# Patient Record
Sex: Female | Born: 1970 | Race: White | Hispanic: No | Marital: Single | Smoking: Never smoker
Health system: Southern US, Community
[De-identification: ages and names within clinical notes are randomized; demographics above are authoritative.]

## PROBLEM LIST (undated history)

## (undated) DIAGNOSIS — F259 Schizoaffective disorder, unspecified: Secondary | ICD-10-CM

## (undated) DIAGNOSIS — H9190 Unspecified hearing loss, unspecified ear: Secondary | ICD-10-CM

## (undated) DIAGNOSIS — K219 Gastro-esophageal reflux disease without esophagitis: Secondary | ICD-10-CM

## (undated) DIAGNOSIS — J45909 Unspecified asthma, uncomplicated: Secondary | ICD-10-CM

## (undated) DIAGNOSIS — K2270 Barrett's esophagus without dysplasia: Secondary | ICD-10-CM

## (undated) DIAGNOSIS — F250 Schizoaffective disorder, bipolar type: Secondary | ICD-10-CM

## (undated) HISTORY — PX: CATARACT EXTRACTION: SUR2

## (undated) HISTORY — PX: EYE SURGERY: SHX253

---

## 2002-02-02 ENCOUNTER — Emergency Department (HOSPITAL_COMMUNITY): Admission: EM | Admit: 2002-02-02 | Discharge: 2002-02-03 | Payer: Self-pay | Admitting: Emergency Medicine

## 2002-02-02 ENCOUNTER — Encounter: Payer: Self-pay | Admitting: Emergency Medicine

## 2002-05-21 ENCOUNTER — Encounter: Payer: Self-pay | Admitting: Emergency Medicine

## 2002-05-21 ENCOUNTER — Emergency Department (HOSPITAL_COMMUNITY): Admission: EM | Admit: 2002-05-21 | Discharge: 2002-05-21 | Payer: Self-pay | Admitting: Emergency Medicine

## 2002-07-16 ENCOUNTER — Emergency Department (HOSPITAL_COMMUNITY): Admission: EM | Admit: 2002-07-16 | Discharge: 2002-07-16 | Payer: Self-pay | Admitting: Emergency Medicine

## 2002-07-16 ENCOUNTER — Encounter: Payer: Self-pay | Admitting: Emergency Medicine

## 2007-07-15 ENCOUNTER — Emergency Department (HOSPITAL_COMMUNITY): Admission: EM | Admit: 2007-07-15 | Discharge: 2007-07-15 | Payer: Self-pay | Admitting: Emergency Medicine

## 2007-07-17 ENCOUNTER — Emergency Department (HOSPITAL_COMMUNITY): Admission: EM | Admit: 2007-07-17 | Discharge: 2007-07-17 | Payer: Self-pay | Admitting: Emergency Medicine

## 2007-07-20 ENCOUNTER — Emergency Department (HOSPITAL_COMMUNITY): Admission: EM | Admit: 2007-07-20 | Discharge: 2007-07-20 | Payer: Self-pay | Admitting: Emergency Medicine

## 2011-04-25 LAB — STOOL CULTURE

## 2011-04-25 LAB — CBC
HCT: 38.3
Hemoglobin: 13.3
MCHC: 34.9
MCV: 82.8
Platelets: 417 — ABNORMAL HIGH
Platelets: 457 — ABNORMAL HIGH
RBC: 4.62
RDW: 13.1
WBC: 16.2 — ABNORMAL HIGH
WBC: 16.8 — ABNORMAL HIGH

## 2011-04-25 LAB — URINALYSIS, ROUTINE W REFLEX MICROSCOPIC
Nitrite: NEGATIVE
Protein, ur: 30 — AB
pH: 5.5

## 2011-04-25 LAB — DIFFERENTIAL
Basophils Absolute: 0
Basophils Relative: 0
Eosinophils Absolute: 0
Eosinophils Absolute: 0
Eosinophils Relative: 0
Lymphocytes Relative: 22
Lymphocytes Relative: 9 — ABNORMAL LOW
Lymphs Abs: 1.5
Lymphs Abs: 3.8
Monocytes Absolute: 1.1 — ABNORMAL HIGH
Monocytes Relative: 7
Neutro Abs: 13.6 — ABNORMAL HIGH
Neutrophils Relative %: 71
Neutrophils Relative %: 84 — ABNORMAL HIGH

## 2011-04-25 LAB — BASIC METABOLIC PANEL
BUN: 15
BUN: 16
CO2: 26
Calcium: 8.1 — ABNORMAL LOW
Calcium: 8.8
Chloride: 101
Creatinine, Ser: 0.86
Creatinine, Ser: 0.89
GFR calc Af Amer: >60
GFR calc non Af Amer: >60
GFR calc non Af Amer: >60
Glucose, Bld: 123 — ABNORMAL HIGH
Potassium: 4.2
Sodium: 137

## 2011-04-25 LAB — POCT PREGNANCY, URINE
Operator id: 29026
Preg Test, Ur: NEGATIVE

## 2011-04-25 LAB — CLOSTRIDIUM DIFFICILE EIA

## 2011-04-25 LAB — URINE MICROSCOPIC-ADD ON

## 2011-04-25 LAB — URINE CULTURE

## 2011-04-25 LAB — OVA AND PARASITE EXAMINATION

## 2013-07-12 ENCOUNTER — Inpatient Hospital Stay (HOSPITAL_COMMUNITY)
Admission: EM | Admit: 2013-07-12 | Discharge: 2013-07-16 | DRG: 202 | Disposition: A | Discharge status: Home / Self Care | Payer: Medicaid Other | Attending: Internal Medicine | Admitting: Internal Medicine

## 2013-07-12 ENCOUNTER — Emergency Department (HOSPITAL_COMMUNITY): Payer: Medicaid Other

## 2013-07-12 ENCOUNTER — Encounter (HOSPITAL_COMMUNITY): Payer: Self-pay | Admitting: Emergency Medicine

## 2013-07-12 DIAGNOSIS — H908 Mixed conductive and sensorineural hearing loss, unspecified: Secondary | ICD-10-CM | POA: Diagnosis present

## 2013-07-12 DIAGNOSIS — F259 Schizoaffective disorder, unspecified: Secondary | ICD-10-CM | POA: Diagnosis present

## 2013-07-12 DIAGNOSIS — H913 Deaf nonspeaking, not elsewhere classified: Secondary | ICD-10-CM | POA: Diagnosis present

## 2013-07-12 DIAGNOSIS — R6510 Systemic inflammatory response syndrome (SIRS) of non-infectious origin without acute organ dysfunction: Secondary | ICD-10-CM | POA: Diagnosis present

## 2013-07-12 DIAGNOSIS — E039 Hypothyroidism, unspecified: Secondary | ICD-10-CM | POA: Diagnosis present

## 2013-07-12 DIAGNOSIS — K5900 Constipation, unspecified: Secondary | ICD-10-CM | POA: Diagnosis present

## 2013-07-12 DIAGNOSIS — F411 Generalized anxiety disorder: Secondary | ICD-10-CM | POA: Diagnosis present

## 2013-07-12 DIAGNOSIS — F419 Anxiety disorder, unspecified: Secondary | ICD-10-CM | POA: Diagnosis present

## 2013-07-12 DIAGNOSIS — K219 Gastro-esophageal reflux disease without esophagitis: Secondary | ICD-10-CM | POA: Diagnosis present

## 2013-07-12 DIAGNOSIS — G809 Cerebral palsy, unspecified: Secondary | ICD-10-CM | POA: Diagnosis present

## 2013-07-12 DIAGNOSIS — J45902 Unspecified asthma with status asthmaticus: Principal | ICD-10-CM | POA: Diagnosis present

## 2013-07-12 HISTORY — DX: Schizoaffective disorder, bipolar type: F25.0

## 2013-07-12 HISTORY — DX: Schizoaffective disorder, unspecified: F25.9

## 2013-07-12 HISTORY — DX: Unspecified asthma, uncomplicated: J45.909

## 2013-07-12 LAB — POCT I-STAT 3, ART BLOOD GAS (G3+)
Acid-Base Excess: 4 mmol/L — ABNORMAL HIGH (ref 0.0–2.0)
Bicarbonate: 25.1 mEq/L — ABNORMAL HIGH (ref 20.0–24.0)
Patient temperature: 99
TCO2: 26 mmol/L (ref 0–100)

## 2013-07-12 LAB — BASIC METABOLIC PANEL
CO2: 27 mEq/L (ref 19–32)
Calcium: 8.6 mg/dL (ref 8.4–10.5)
GFR calc Af Amer: 84 mL/min — ABNORMAL LOW (ref >90)
Sodium: 134 mEq/L — ABNORMAL LOW (ref 135–145)

## 2013-07-12 LAB — CBC
MCV: 86.7 fL (ref 78.0–100.0)
Platelets: 318 10*3/uL (ref 150–400)
RBC: 4.2 MIL/uL (ref 3.87–5.11)
WBC: 13.4 10*3/uL — ABNORMAL HIGH (ref 4.0–10.5)

## 2013-07-12 LAB — PRO B NATRIURETIC PEPTIDE: Pro B Natriuretic peptide (BNP): 122.8 pg/mL (ref 0–125)

## 2013-07-12 LAB — CG4 I-STAT (LACTIC ACID): Lactic Acid, Venous: 1.47 mmol/L (ref 0.5–2.2)

## 2013-07-12 MED ORDER — ALBUTEROL SULFATE (5 MG/ML) 0.5% IN NEBU
INHALATION_SOLUTION | RESPIRATORY_TRACT | Status: AC
  Filled 2013-07-12: qty 1

## 2013-07-12 MED ORDER — ALBUTEROL SULFATE (5 MG/ML) 0.5% IN NEBU
10.0000 mg | INHALATION_SOLUTION | Freq: Once | RESPIRATORY_TRACT | Status: AC
  Administered 2013-07-13: 10 mg via RESPIRATORY_TRACT
  Filled 2013-07-12: qty 2

## 2013-07-12 MED ORDER — ALBUTEROL SULFATE (5 MG/ML) 0.5% IN NEBU
5.0000 mg | INHALATION_SOLUTION | Freq: Once | RESPIRATORY_TRACT | Status: AC
  Administered 2013-07-12: 5 mg via RESPIRATORY_TRACT
  Filled 2013-07-12: qty 1

## 2013-07-12 MED ORDER — METHYLPREDNISOLONE SODIUM SUCC 125 MG IJ SOLR
125.0000 mg | Freq: Once | INTRAMUSCULAR | Status: AC
  Administered 2013-07-12: 125 mg via INTRAVENOUS
  Filled 2013-07-12: qty 2

## 2013-07-12 MED ORDER — MAGNESIUM SULFATE 40 MG/ML IJ SOLN
2.0000 g | Freq: Once | INTRAMUSCULAR | Status: AC
  Administered 2013-07-13: 2 g via INTRAVENOUS
  Filled 2013-07-12: qty 50

## 2013-07-12 MED ORDER — IPRATROPIUM BROMIDE 0.02 % IN SOLN
0.5000 mg | Freq: Once | RESPIRATORY_TRACT | Status: AC
  Administered 2013-07-12: 0.5 mg via RESPIRATORY_TRACT
  Filled 2013-07-12: qty 2.5

## 2013-07-12 MED ORDER — IPRATROPIUM BROMIDE 0.02 % IN SOLN
0.5000 mg | Freq: Once | RESPIRATORY_TRACT | Status: DC
  Filled 2013-07-12 (×2): qty 2.5

## 2013-07-12 NOTE — ED Provider Notes (Signed)
CSN: [id number]     Arrival date & time 07/12/13  2129 History  First MD Initiated Contact with Patient 07/12/13 2217     Chief Complaint  Patient presents with  . Shortness of Breath   history obtained through the patient and her mother, pt is hearing impaired, translator used HPI Patient is a 42 year old woman who has history of mental health issues. She resides in a group home came home to be with family during the holidays. Most of the history was provided by mom . Mom noticed that she was having difficulty with her breathing this afternoon. She does have a history of asthma so she tried giving her some of her breathing treatments. This did not help. Her breathing symptoms progressed throughout the day. Patient also started complaining of abdominal swelling and discomfort.  She has not had any vomiting or diarrhea. Mom is not sure the last time she had a bowel movement. Past Medical History  Diagnosis Date  . Asthma   . Schizo affective schizophrenia    Past Surgical History  Procedure Laterality Date  . Eye surgery     No family history on file. History  Substance Use Topics  . Smoking status: Never Smoker   . Smokeless tobacco: Never Used  . Alcohol Use: No   OB History   Grav Para Term Preterm Abortions TAB SAB Ect Mult Living                 Review of Systems  All other systems reviewed and are negative.    Allergies  Penicillins  Home Medications   Current Outpatient Rx  Name  Route  Sig  Dispense  Refill  . albuterol (PROVENTIL HFA;VENTOLIN HFA) 108 (90 BASE) MCG/ACT inhaler   Inhalation   Inhale 2 puffs into the lungs every 6 (six) hours as needed for wheezing or shortness of breath.         Marland Kitchen albuterol (PROVENTIL) (2.5 MG/3ML) 0.083% nebulizer solution   Nebulization   Take 2.5 mg by nebulization every 4 (four) hours as needed for wheezing or shortness of breath.         Marland Kitchen atorvastatin (LIPITOR) 20 MG tablet   Oral   Take 20 mg by mouth daily.          . budesonide (PULMICORT) 1 MG/2ML nebulizer solution   Nebulization   Take 1 mg by nebulization every 12 (twelve) hours.         . cetirizine (ZYRTEC) 10 MG tablet   Oral   Take 10 mg by mouth at bedtime.         . clotrimazole-betamethasone (LOTRISONE) cream   Topical   Apply 1 application topically as needed.         . divalproex (DEPAKOTE ER) 250 MG 24 hr tablet   Oral   Take 500 mg by mouth 2 (two) times daily.         Marland Kitchen FLUoxetine (PROZAC) 40 MG capsule   Oral   Take 40 mg by mouth daily.         . fluticasone (FLONASE) 50 MCG/ACT nasal spray   Each Nare   Place 2 sprays into both nostrils daily.         . formoterol (PERFOROMIST) 20 MCG/2ML nebulizer solution   Nebulization   Take 20 mcg by nebulization 2 (two) times daily.         Marland Kitchen levothyroxine (SYNTHROID, LEVOTHROID) 112 MCG tablet   Oral   Take 112  mcg by mouth daily before breakfast.         . miconazole (MICOTIN) 2 % cream   Topical   Apply 1 application topically daily.         . mirtazapine (REMERON) 30 MG tablet   Oral   Take 30 mg by mouth at bedtime.         . montelukast (SINGULAIR) 10 MG tablet   Oral   Take 10 mg by mouth at bedtime.         . Multiple Vitamin (MULTIVITAMIN WITH MINERALS) TABS tablet   Oral   Take 1 tablet by mouth daily.         . naltrexone (DEPADE) 50 MG tablet   Oral   Take 100 mg by mouth 2 (two) times daily.         . norgestimate-ethinyl estradiol (ORTHO-CYCLEN, 28,) 0.25-35 MG-MCG tablet   Oral   Take 1 tablet by mouth daily.         Marland Kitchen OLANZapine (ZYPREXA) 20 MG tablet   Oral   Take 20 mg by mouth at bedtime.         Marland Kitchen omeprazole (PRILOSEC) 40 MG capsule   Oral   Take 40 mg by mouth 2 (two) times daily.         Marland Kitchen rOPINIRole (REQUIP) 1 MG tablet   Oral   Take 2 mg by mouth at bedtime.         Marland Kitchen tetrahydrozoline 0.05 % ophthalmic solution   Both Eyes   Place 2 drops into both eyes 2 (two) times daily as needed (for  irritation).          BP 145/74  Pulse 111  Temp(Src) 99.9 F (37.7 C) (Oral)  Resp 24  SpO2 95%  LMP 06/17/2013 Physical Exam  Nursing note and vitals reviewed. Constitutional: She appears well-developed and well-nourished. She appears distressed.  HENT:  Head: Normocephalic and atraumatic.  Right Ear: External ear normal.  Left Ear: External ear normal.  Mouth/Throat: No oropharyngeal exudate.  Eyes: Conjunctivae are normal. Right eye exhibits no discharge. Left eye exhibits no discharge. No scleral icterus.  Neck: Neck supple. No tracheal deviation present.  Cardiovascular: Normal rate, regular rhythm and intact distal pulses.   Pulmonary/Chest: Accessory muscle usage present. No stridor. Tachypnea noted. No respiratory distress. She has wheezes (few). She has rales (diffusely).  Abdominal: Soft. Bowel sounds are normal. She exhibits no distension. There is no tenderness. There is no rigidity, no rebound and no guarding. No hernia.  Firm abdomen, non tender, may be secondary to her tachypnea  Musculoskeletal: She exhibits no edema and no tenderness.  Neurological: She is alert. She has normal strength. No sensory deficit. Cranial nerve deficit:  no gross defecits noted. She exhibits normal muscle tone. She displays no seizure activity. Coordination normal.  Skin: Skin is warm and dry. No rash noted. She is not diaphoretic.  Psychiatric: She has a normal mood and affect.    ED Course  Procedures (including critical care time) Labs Review Labs Reviewed  BASIC METABOLIC PANEL - Abnormal; Notable for the following:    Sodium 134 (*)    Glucose, Bld 109 (*)    GFR calc non Af Amer 72 (*)    GFR calc Af Amer 84 (*)    All other components within normal limits  CBC - Abnormal; Notable for the following:    WBC 13.4 (*)    All other components within normal limits  POCT  I-STAT 3, BLOOD GAS (G3+) - Abnormal; Notable for the following:    pH, Arterial 7.582 (*)    pCO2  arterial 26.6 (*)    pO2, Arterial 105.0 (*)    Bicarbonate 25.1 (*)    Acid-Base Excess 4.0 (*)    All other components within normal limits  PRO B NATRIURETIC PEPTIDE  URINALYSIS, ROUTINE W REFLEX MICROSCOPIC  CG4 I-STAT (LACTIC ACID)  POCT I-STAT TROPONIN I   Imaging Review Dg Chest 2 View  07/12/2013   CLINICAL DATA:  Chest pain.  Abdominal pain.  Short of breath.  EXAM: CHEST  2 VIEW  COMPARISON:  None.  FINDINGS: Cardiopericardial silhouette within normal limits. Mediastinal contours normal. Trachea midline. No airspace disease or effusion. No free air is present underneath the hemidiaphragms. Gaseous distension of bowel is present.  IMPRESSION: No active cardiopulmonary disease.   Electronically Signed   By: Andreas Newport M.D.   On: 07/12/2013 23:16   Dg Abd 2 Views  07/12/2013   CLINICAL DATA:  Abdominal pain  EXAM: ABDOMEN - 2 VIEW  COMPARISON:  None available  FINDINGS: Moderately large amount of retained stool is present within the ascending, transverse, and descending colon. A mildly prominent redundant loop of gas-filled sigmoid colon is seen within the right hemi abdomen measuring up to 7.1 cm in diameter. No dilated loops of small bowel identified. No frank evidence of obstruction or ileus. No free intraperitoneal air. No soft tissue masses or abnormal calcifications.  Mild degenerative changes noted within the lumbar spine. No acute osseous abnormality. Visualized portions of the lungs are clear.  IMPRESSION: 1. Mildly prominent gas-filled loop of sigmoid colon within the mid -right lower abdomen without frank evidence of obstruction or ileus. If there is clinical concern for possible developing sigmoid volvulus, a short interval follow-up radiograph could be performed to evaluate for interval change.  2. Moderately large amount of retained stool throughout the ascending, transverse, and descending colon.   Electronically Signed   By: Rise Mu M.D.   On: 07/12/2013  23:32    EKG Interpretation    Date/Time:  Tuesday July 12 2013 22:42:05 EST Ventricular Rate:  110 PR Interval:  139 QRS Duration: 79 QT Interval:  360 QTC Calculation: 487 R Axis:   82 Text Interpretation:  Sinus tachycardia Right atrial enlargement ST elev, probable normal early repol pattern Borderline prolonged QT interval Artifact No previous tracing Confirmed by Rasheema Truluck  MD-J, Reem Fleury ([id number]) on 07/12/2013 10:51:32 PM           Medications  albuterol (PROVENTIL) (5 MG/ML) 0.5% nebulizer solution (  Not Given 07/12/13 2330)  ipratropium (ATROVENT) nebulizer solution 0.5 mg (0.5 mg Nebulization Not Given 07/12/13 2331)  albuterol (PROVENTIL) (5 MG/ML) 0.5% nebulizer solution 10 mg (not administered)  magnesium sulfate IVPB 2 g 50 mL (not administered)  albuterol (PROVENTIL) (5 MG/ML) 0.5% nebulizer solution 5 mg (5 mg Nebulization Given 07/12/13 2329)  ipratropium (ATROVENT) nebulizer solution 0.5 mg (0.5 mg Nebulization Given 07/12/13 2329)  methylPREDNISolone sodium succinate (SOLU-MEDROL) 125 mg/2 mL injection 125 mg (125 mg Intravenous Given 07/12/13 2326)   2351  Pt is still tachypneic.  Labored breathing with wheezes and crackles.  Labs are reassuring.  Suspect asthma exacerbation.  Abd xray most likely constipation  MDM   1. Status asthmaticus   2. Constipation    Pt continues with labored breathing.  Will admit for further treatment.    Celene Kras, MD 07/12/13 [phone]

## 2013-07-12 NOTE — ED Notes (Signed)
Mother st's pt started having problems breathing this afternoon.  Pt has hx of asthma and has had 3 breathing tx's prior to coming to ED which has not not helped.  Pt also c/o abd. Swelling.  Pt is normally in a group home but is home for Christmas.  Cough has been productive.

## 2013-07-13 ENCOUNTER — Encounter (HOSPITAL_COMMUNITY): Payer: Self-pay | Admitting: Internal Medicine

## 2013-07-13 DIAGNOSIS — H908 Mixed conductive and sensorineural hearing loss, unspecified: Secondary | ICD-10-CM | POA: Diagnosis present

## 2013-07-13 DIAGNOSIS — F419 Anxiety disorder, unspecified: Secondary | ICD-10-CM | POA: Diagnosis present

## 2013-07-13 DIAGNOSIS — H913 Deaf nonspeaking, not elsewhere classified: Secondary | ICD-10-CM | POA: Diagnosis present

## 2013-07-13 DIAGNOSIS — E039 Hypothyroidism, unspecified: Secondary | ICD-10-CM | POA: Diagnosis present

## 2013-07-13 DIAGNOSIS — G809 Cerebral palsy, unspecified: Secondary | ICD-10-CM | POA: Diagnosis present

## 2013-07-13 DIAGNOSIS — F411 Generalized anxiety disorder: Secondary | ICD-10-CM

## 2013-07-13 DIAGNOSIS — K5900 Constipation, unspecified: Secondary | ICD-10-CM

## 2013-07-13 DIAGNOSIS — J45902 Unspecified asthma with status asthmaticus: Principal | ICD-10-CM | POA: Diagnosis present

## 2013-07-13 LAB — TSH: TSH: 3.244 u[IU]/mL (ref 0.350–4.500)

## 2013-07-13 LAB — BASIC METABOLIC PANEL
BUN: 13 mg/dL (ref 6–23)
Calcium: 8.7 mg/dL (ref 8.4–10.5)
Chloride: 97 mEq/L (ref 96–112)
Creatinine, Ser: 0.8 mg/dL (ref 0.50–1.10)
GFR calc Af Amer: >90 mL/min (ref >90)
GFR calc non Af Amer: 89 mL/min — ABNORMAL LOW (ref >90)
Sodium: 134 mEq/L — ABNORMAL LOW (ref 135–145)

## 2013-07-13 LAB — CBC
MCHC: 34.4 g/dL (ref 30.0–36.0)
Platelets: UNDETERMINED 10*3/uL (ref 150–400)
RDW: 14.1 % (ref 11.5–15.5)
WBC: 13.2 10*3/uL — ABNORMAL HIGH (ref 4.0–10.5)

## 2013-07-13 LAB — MRSA PCR SCREENING: MRSA by PCR: NEGATIVE

## 2013-07-13 MED ORDER — CLOTRIMAZOLE 1 % EX CREA
TOPICAL_CREAM | Freq: Two times a day (BID) | CUTANEOUS | Status: DC
  Administered 2013-07-13 – 2013-07-16 (×6): via TOPICAL
  Filled 2013-07-13: qty 15

## 2013-07-13 MED ORDER — ATORVASTATIN CALCIUM 20 MG PO TABS
20.0000 mg | ORAL_TABLET | Freq: Every day | ORAL | Status: DC
  Administered 2013-07-13 – 2013-07-16 (×4): 20 mg via ORAL
  Filled 2013-07-13 (×4): qty 1

## 2013-07-13 MED ORDER — BUDESONIDE 0.5 MG/2ML IN SUSP
1.0000 mg | Freq: Two times a day (BID) | RESPIRATORY_TRACT | Status: DC
  Administered 2013-07-13 – 2013-07-16 (×5): 1 mg via RESPIRATORY_TRACT
  Filled 2013-07-13 (×11): qty 4

## 2013-07-13 MED ORDER — NAPHAZOLINE HCL 0.1 % OP SOLN
1.0000 [drp] | Freq: Four times a day (QID) | OPHTHALMIC | Status: DC | PRN
  Filled 2013-07-13: qty 15

## 2013-07-13 MED ORDER — OLANZAPINE 10 MG PO TABS
20.0000 mg | ORAL_TABLET | Freq: Every day | ORAL | Status: DC
  Administered 2013-07-13 – 2013-07-15 (×3): 20 mg via ORAL
  Filled 2013-07-13 (×5): qty 2

## 2013-07-13 MED ORDER — SODIUM CHLORIDE 0.9 % IJ SOLN: 3.0000 mL | Freq: Two times a day (BID) | INTRAMUSCULAR | Status: DC

## 2013-07-13 MED ORDER — MAGNESIUM HYDROXIDE 400 MG/5ML PO SUSP
30.0000 mL | Freq: Every day | ORAL | Status: DC | PRN
  Administered 2013-07-14: 30 mL via ORAL
  Filled 2013-07-13: qty 30

## 2013-07-13 MED ORDER — DOCUSATE SODIUM 100 MG PO CAPS
100.0000 mg | ORAL_CAPSULE | Freq: Two times a day (BID) | ORAL | Status: DC
  Administered 2013-07-13 – 2013-07-15 (×4): 100 mg via ORAL
  Filled 2013-07-13 (×3): qty 1

## 2013-07-13 MED ORDER — NALTREXONE HCL 50 MG PO TABS
100.0000 mg | ORAL_TABLET | Freq: Two times a day (BID) | ORAL | Status: DC
  Administered 2013-07-13 – 2013-07-16 (×7): 100 mg via ORAL
  Filled 2013-07-13 (×9): qty 2

## 2013-07-13 MED ORDER — LEVOTHYROXINE SODIUM 112 MCG PO TABS
112.0000 ug | ORAL_TABLET | Freq: Every day | ORAL | Status: DC
  Administered 2013-07-13 – 2013-07-16 (×4): 112 ug via ORAL
  Filled 2013-07-13 (×6): qty 1

## 2013-07-13 MED ORDER — SODIUM CHLORIDE 0.9 % IJ SOLN
3.0000 mL | Freq: Two times a day (BID) | INTRAMUSCULAR | Status: DC
  Administered 2013-07-13 – 2013-07-15 (×5): 3 mL via INTRAVENOUS

## 2013-07-13 MED ORDER — LEVALBUTEROL HCL 0.63 MG/3ML IN NEBU
0.6300 mg | INHALATION_SOLUTION | Freq: Four times a day (QID) | RESPIRATORY_TRACT | Status: DC | PRN
  Filled 2013-07-13: qty 3

## 2013-07-13 MED ORDER — DIVALPROEX SODIUM ER 500 MG PO TB24
500.0000 mg | ORAL_TABLET | Freq: Two times a day (BID) | ORAL | Status: DC
  Administered 2013-07-13 – 2013-07-16 (×7): 500 mg via ORAL
  Filled 2013-07-13 (×10): qty 1

## 2013-07-13 MED ORDER — MORPHINE SULFATE 4 MG/ML IJ SOLN
4.0000 mg | Freq: Once | INTRAMUSCULAR | Status: AC
  Administered 2013-07-13: 4 mg via INTRAVENOUS
  Filled 2013-07-13: qty 1

## 2013-07-13 MED ORDER — LORAZEPAM 2 MG/ML IJ SOLN
1.0000 mg | Freq: Once | INTRAMUSCULAR | Status: AC
  Administered 2013-07-13: 1 mg via INTRAVENOUS

## 2013-07-13 MED ORDER — SODIUM CHLORIDE 0.9 % IV SOLN: 250.0000 mL | INTRAVENOUS | Status: DC | PRN

## 2013-07-13 MED ORDER — ARFORMOTEROL TARTRATE 15 MCG/2ML IN NEBU
15.0000 ug | INHALATION_SOLUTION | Freq: Two times a day (BID) | RESPIRATORY_TRACT | Status: DC
  Administered 2013-07-13 – 2013-07-16 (×6): 15 ug via RESPIRATORY_TRACT
  Filled 2013-07-13 (×10): qty 2

## 2013-07-13 MED ORDER — LEVOFLOXACIN IN D5W 750 MG/150ML IV SOLN
750.0000 mg | INTRAVENOUS | Status: DC
  Administered 2013-07-13 – 2013-07-14 (×2): 750 mg via INTRAVENOUS
  Filled 2013-07-13 (×2): qty 150

## 2013-07-13 MED ORDER — SODIUM CHLORIDE 0.9 % IJ SOLN: 3.0000 mL | INTRAMUSCULAR | Status: DC | PRN

## 2013-07-13 MED ORDER — LEVALBUTEROL HCL 0.63 MG/3ML IN NEBU
0.6300 mg | INHALATION_SOLUTION | Freq: Four times a day (QID) | RESPIRATORY_TRACT | Status: DC
  Administered 2013-07-13 – 2013-07-16 (×12): 0.63 mg via RESPIRATORY_TRACT
  Filled 2013-07-13 (×21): qty 3

## 2013-07-13 MED ORDER — LORAZEPAM 2 MG/ML IJ SOLN
1.0000 mg | Freq: Once | INTRAMUSCULAR | Status: AC
  Administered 2013-07-13: 1 mg via INTRAVENOUS
  Filled 2013-07-13: qty 1

## 2013-07-13 MED ORDER — MONTELUKAST SODIUM 10 MG PO TABS
10.0000 mg | ORAL_TABLET | Freq: Every day | ORAL | Status: DC
  Administered 2013-07-13 – 2013-07-15 (×3): 10 mg via ORAL
  Filled 2013-07-13 (×5): qty 1

## 2013-07-13 MED ORDER — FLUOXETINE HCL 20 MG PO CAPS
40.0000 mg | ORAL_CAPSULE | Freq: Every day | ORAL | Status: DC
  Administered 2013-07-13 – 2013-07-16 (×4): 40 mg via ORAL
  Filled 2013-07-13 (×4): qty 2

## 2013-07-13 MED ORDER — PANTOPRAZOLE SODIUM 40 MG PO TBEC
40.0000 mg | DELAYED_RELEASE_TABLET | Freq: Every day | ORAL | Status: DC
  Administered 2013-07-13 – 2013-07-16 (×4): 40 mg via ORAL
  Filled 2013-07-13 (×4): qty 1

## 2013-07-13 MED ORDER — ONDANSETRON HCL 4 MG PO TABS: 4.0000 mg | ORAL_TABLET | Freq: Four times a day (QID) | ORAL | Status: DC | PRN

## 2013-07-13 MED ORDER — METHYLPREDNISOLONE SODIUM SUCC 40 MG IJ SOLR
40.0000 mg | Freq: Four times a day (QID) | INTRAMUSCULAR | Status: DC
  Administered 2013-07-13 – 2013-07-14 (×6): 40 mg via INTRAVENOUS
  Filled 2013-07-13 (×10): qty 1

## 2013-07-13 MED ORDER — MIRTAZAPINE 30 MG PO TABS
30.0000 mg | ORAL_TABLET | Freq: Every day | ORAL | Status: DC
  Administered 2013-07-13 – 2013-07-15 (×3): 30 mg via ORAL
  Filled 2013-07-13 (×5): qty 1

## 2013-07-13 MED ORDER — FLUTICASONE PROPIONATE 50 MCG/ACT NA SUSP
2.0000 | Freq: Every day | NASAL | Status: DC
  Administered 2013-07-13 – 2013-07-16 (×4): 2 via NASAL
  Filled 2013-07-13: qty 16

## 2013-07-13 MED ORDER — HEPARIN SODIUM (PORCINE) 5000 UNIT/ML IJ SOLN
5000.0000 [IU] | Freq: Three times a day (TID) | INTRAMUSCULAR | Status: DC
  Administered 2013-07-13 – 2013-07-16 (×8): 5000 [IU] via SUBCUTANEOUS
  Filled 2013-07-13 (×15): qty 1

## 2013-07-13 MED ORDER — LEVALBUTEROL HCL 0.63 MG/3ML IN NEBU
INHALATION_SOLUTION | RESPIRATORY_TRACT | Status: AC
  Administered 2013-07-13: 0.63 mg
  Filled 2013-07-13: qty 3

## 2013-07-13 MED ORDER — LORAZEPAM 1 MG PO TABS
1.0000 mg | ORAL_TABLET | Freq: Three times a day (TID) | ORAL | Status: DC | PRN
  Filled 2013-07-13: qty 1

## 2013-07-13 MED ORDER — METHOCARBAMOL 100 MG/ML IJ SOLN
500.0000 mg | Freq: Four times a day (QID) | INTRAVENOUS | Status: DC | PRN
  Administered 2013-07-13: 500 mg via INTRAVENOUS
  Filled 2013-07-13: qty 5

## 2013-07-13 MED ORDER — NORGESTIMATE-ETH ESTRADIOL 0.25-35 MG-MCG PO TABS
1.0000 | ORAL_TABLET | Freq: Every day | ORAL | Status: DC
  Administered 2013-07-13 – 2013-07-16 (×4): 1 via ORAL

## 2013-07-13 MED ORDER — ONDANSETRON HCL 4 MG/2ML IJ SOLN: 4.0000 mg | Freq: Four times a day (QID) | INTRAMUSCULAR | Status: DC | PRN

## 2013-07-13 MED ORDER — ROPINIROLE HCL 1 MG PO TABS
2.0000 mg | ORAL_TABLET | Freq: Every day | ORAL | Status: DC
  Administered 2013-07-13 – 2013-07-15 (×2): 2 mg via ORAL
  Filled 2013-07-13 (×5): qty 2

## 2013-07-13 MED ORDER — LORAZEPAM 2 MG/ML IJ SOLN
INTRAMUSCULAR | Status: AC
  Filled 2013-07-13: qty 1

## 2013-07-13 NOTE — ED Notes (Signed)
Pt very anxious at present, Ativan ordered per Dr. Conley Rolls.

## 2013-07-13 NOTE — H&P (Signed)
[facility] History and Physical  Angela Nicholson ZHY:[id number] DOB: 18-Feb-1971    PCP:   Jeanmarie Plant, MD   Chief Complaint: shortness of breath and abdominal pain.  ASL interpreter was used.   HPI: Angela Nicholson is an 42 y.o. female with hx of deafmutism, cerebral palsy, asthma, schizoaffective disorder, presents to the ER with increase shortness of breath for several days.  She also complaints of abdominal pain and has not been moving her bowel.  Evaluation in the ER showed she was quite anxious, having tachypnea, and agitation.  Her ABG: 7.58/27/paOx=105.  Her abdominal film showed increase stool burden, and ileus, can't exclude volvulus. Her CXR showed no infiltrates, and she has no leukocytosis, significant fever or chills.  She was given IV solumedrol, along with frequent nebs, and Magnesium IV.  Hospitalist was asked to admit her for tx of status asthmaticus and constipation.    Rewiew of Systems:  Constitutional: Negative for malaise, fever and chills. No significant weight loss or weight gain Eyes: Negative for eye pain, redness and discharge, diplopia, visual changes, or flashes of light. ENMT: Negative for ear pain, hoarseness, nasal congestion, sinus pressure and sore throat. No headaches; tinnitus, drooling, or problem swallowing. Cardiovascular: Negative for chest pain, palpitations, diaphoresis,  and peripheral edema. ; No orthopnea, PND Respiratory: Negative for cough, hemoptysis, and stridor. No pleuritic chestpain. Gastrointestinal: Negative for nausea, vomiting, diarrhea, constipation, abdominal pain, melena, blood in stool, hematemesis, jaundice and rectal bleeding.    Genitourinary: Negative for frequency, dysuria, incontinence,flank pain and hematuria; Musculoskeletal: Negative for back pain and neck pain. Negative for swelling and trauma.;  Skin: . Negative for pruritus, rash, abrasions, bruising and skin lesion.; ulcerations Neuro: Negative for headache,  lightheadedness and neck stiffness. Negative for weakness, altered level of consciousness , altered mental status, extremity weakness, burning feet, involuntary movement, seizure and syncope.  Psych: negative for anxiety, depression, insomnia, tearfulness, panic attacks, hallucinations, paranoia, suicidal or homicidal ideation    Past Medical History  Diagnosis Date  . Asthma   . Schizo affective schizophrenia     Past Surgical History  Procedure Laterality Date  . Eye surgery      Medications:  HOME MEDS: Prior to Admission medications   Medication Sig Start Date End Date Taking? Authorizing Provider  albuterol (PROVENTIL HFA;VENTOLIN HFA) 108 (90 BASE) MCG/ACT inhaler Inhale 2 puffs into the lungs every 6 (six) hours as needed for wheezing or shortness of breath.   Yes Historical Provider, MD  albuterol (PROVENTIL) (2.5 MG/3ML) 0.083% nebulizer solution Take 2.5 mg by nebulization every 4 (four) hours as needed for wheezing or shortness of breath.   Yes Historical Provider, MD  atorvastatin (LIPITOR) 20 MG tablet Take 20 mg by mouth daily.   Yes Historical Provider, MD  budesonide (PULMICORT) 1 MG/2ML nebulizer solution Take 1 mg by nebulization every 12 (twelve) hours.   Yes Historical Provider, MD  cetirizine (ZYRTEC) 10 MG tablet Take 10 mg by mouth at bedtime.   Yes Historical Provider, MD  clotrimazole-betamethasone (LOTRISONE) cream Apply 1 application topically as needed.   Yes Historical Provider, MD  divalproex (DEPAKOTE ER) 250 MG 24 hr tablet Take 500 mg by mouth 2 (two) times daily.   Yes Historical Provider, MD  FLUoxetine (PROZAC) 40 MG capsule Take 40 mg by mouth daily.   Yes Historical Provider, MD  fluticasone (FLONASE) 50 MCG/ACT nasal spray Place 2 sprays into both nostrils daily.   Yes Historical Provider, MD  formoterol (PERFOROMIST) 20 MCG/2ML nebulizer solution Take  20 mcg by nebulization 2 (two) times daily.   Yes Historical Provider, MD  levothyroxine  (SYNTHROID, LEVOTHROID) 112 MCG tablet Take 112 mcg by mouth daily before breakfast.   Yes Historical Provider, MD  miconazole (MICOTIN) 2 % cream Apply 1 application topically daily.   Yes Historical Provider, MD  mirtazapine (REMERON) 30 MG tablet Take 30 mg by mouth at bedtime.   Yes Historical Provider, MD  montelukast (SINGULAIR) 10 MG tablet Take 10 mg by mouth at bedtime.   Yes Historical Provider, MD  Multiple Vitamin (MULTIVITAMIN WITH MINERALS) TABS tablet Take 1 tablet by mouth daily.   Yes Historical Provider, MD  naltrexone (DEPADE) 50 MG tablet Take 100 mg by mouth 2 (two) times daily.   Yes Historical Provider, MD  norgestimate-ethinyl estradiol (ORTHO-CYCLEN, 28,) 0.25-35 MG-MCG tablet Take 1 tablet by mouth daily.   Yes Historical Provider, MD  OLANZapine (ZYPREXA) 20 MG tablet Take 20 mg by mouth at bedtime.   Yes Historical Provider, MD  omeprazole (PRILOSEC) 40 MG capsule Take 40 mg by mouth 2 (two) times daily.   Yes Historical Provider, MD  rOPINIRole (REQUIP) 1 MG tablet Take 2 mg by mouth at bedtime.   Yes Historical Provider, MD  tetrahydrozoline 0.05 % ophthalmic solution Place 2 drops into both eyes 2 (two) times daily as needed (for irritation).   Yes Historical Provider, MD     Allergies:  Allergies  Allergen Reactions  . Penicillins Rash    Social History:   reports that she has never smoked. She has never used smokeless tobacco. She reports that she does not drink alcohol or use illicit drugs.  Family History: No family history on file.   Physical Exam: Filed Vitals:   07/12/13 2137 07/12/13 2326 07/13/13 0005 07/13/13 0056  BP: 138/61 145/74 138/122 124/88  Pulse: 111  114   Temp: 99.9 F (37.7 C)     TempSrc: Oral     Resp: 32 24 28 32  SpO2: 90% 95% 95% 99%   Blood pressure 124/88, pulse 114, temperature 99.9 F (37.7 C), temperature source Oral, resp. rate 32, last menstrual period 06/17/2013, SpO2 99.00%.  GEN:  Pleasant patient lying in  the stretcher in moderate respiratory distress. Having anxiety and tachypnea but cooperative with exam. PSYCH:  Alert,  does  appear anxious or depressed; affect is appropriate. HEENT: Mucous membranes pink and anicteric; PERRLA; EOM intact; no cervical lymphadenopathy nor thyromegaly or carotid bruit; no JVD; There were no stridor. Neck is very supple. Breasts:: Not examined CHEST WALL: No tenderness CHEST: rapid respiration, insp and exp wheezing. HEART: Regular rate and rhythm.  There are no murmur, rub, or gallops.   BACK: No kyphosis or scoliosis; no CVA tenderness ABDOMEN: soft and non-tender; no masses, no organomegaly, normal abdominal bowel sounds; no pannus; no intertriginous candida. There is no rebound and no distention. Rectal Exam: Not done EXTREMITIES: No bone or joint deformity; age-appropriate arthropathy of the hands and knees; no edema; no ulcerations.  There is no calf tenderness. Genitalia: not examined PULSES: 2+ and symmetric SKIN: Normal hydration no rash or ulceration CNS: grossly intact no focal lateralizing neurologic deficit.phonation, facial symmetry and tongue midline. DTR are normal bilaterally, Labs on Admission:  Basic Metabolic Panel:  Recent Labs Lab 07/12/13 2240  NA 134*  K 4.5  CL 96  CO2 27  GLUCOSE 109*  BUN 15  CREATININE 0.95  CALCIUM 8.6   Liver Function Tests: No results found for this basename: AST, ALT,  ALKPHOS, BILITOT, PROT, ALBUMIN,  in the last 168 hours No results found for this basename: LIPASE, AMYLASE,  in the last 168 hours No results found for this basename: AMMONIA,  in the last 168 hours CBC:  Recent Labs Lab 07/12/13 2240  WBC 13.4*  HGB 12.3  HCT 36.4  MCV 86.7  PLT 318   Cardiac Enzymes: No results found for this basename: CKTOTAL, CKMB, CKMBINDEX, TROPONINI,  in the last 168 hours  CBG: No results found for this basename: GLUCAP,  in the last 168 hours   Radiological Exams on Admission: Dg Chest 2  View  07/12/2013   CLINICAL DATA:  Chest pain.  Abdominal pain.  Short of breath.  EXAM: CHEST  2 VIEW  COMPARISON:  None.  FINDINGS: Cardiopericardial silhouette within normal limits. Mediastinal contours normal. Trachea midline. No airspace disease or effusion. No free air is present underneath the hemidiaphragms. Gaseous distension of bowel is present.  IMPRESSION: No active cardiopulmonary disease.   Electronically Signed   By: Andreas Newport M.D.   On: 07/12/2013 23:16   Dg Abd 2 Views  07/12/2013   CLINICAL DATA:  Abdominal pain  EXAM: ABDOMEN - 2 VIEW  COMPARISON:  None available  FINDINGS: Moderately large amount of retained stool is present within the ascending, transverse, and descending colon. A mildly prominent redundant loop of gas-filled sigmoid colon is seen within the right hemi abdomen measuring up to 7.1 cm in diameter. No dilated loops of small bowel identified. No frank evidence of obstruction or ileus. No free intraperitoneal air. No soft tissue masses or abnormal calcifications.  Mild degenerative changes noted within the lumbar spine. No acute osseous abnormality. Visualized portions of the lungs are clear.  IMPRESSION: 1. Mildly prominent gas-filled loop of sigmoid colon within the mid -right lower abdomen without frank evidence of obstruction or ileus. If there is clinical concern for possible developing sigmoid volvulus, a short interval follow-up radiograph could be performed to evaluate for interval change.  2. Moderately large amount of retained stool throughout the ascending, transverse, and descending colon.   Electronically Signed   By: Rise Mu M.D.   On: 07/12/2013 23:32   Assessment/Plan Present on Admission:  . Asthma with status asthmaticus . Hypothyroidism . Cerebral palsy . Schizo affective schizophrenia . 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 Deafmutism . Anxiety . Status asthmaticus  PLAN:  I  suspect her abdominal discomfort is from constipation.  She doesn't have volvulus clinically, but will follow clinically and follow abdominal film if she doesn't improve later today.  Will give her bowel regimen.  For her asthma, will give Zopinex as she has tachycardia, along with continue IV solumedrol and oxygen.  She is quite anxious and will be given some benzodiapezines.  I will continue her psych meds.  She will be place in the SDU for closer monitoring.  Thank you for allowing me to participate in her care.  Other plans as per orders.  Code Status: FULL Unk Lightning, MD. [facility] Pager [phone] 7pm to 7am.  07/13/2013, 12:58 AM

## 2013-07-13 NOTE — Progress Notes (Signed)
[facility] PROGRESS NOTE  Angela Nicholson ZHY:[id number] DOB: 12-15-1970 DOA: 07/12/2013 PCP: Jeanmarie Plant, MD  Assessment/Plan: 42 y/o female with PMH of deafmutism, cerebral palsy, asthma, schizoaffective disorder, gerd, hpl presented with SOB, productive cough, fever, chills admitted with asthma exacerbation  Asthma with status asthmaticus . Hypothyroidism . Cerebral palsy . Schizo affective schizophrenia . 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 Deafmutism . Anxiety . Status asthmaticus     1. Acute asthma exacerbation ' ABG 7.5-26-105-25; lung: Rales, rhonchi  BL; SIRS/leukocytosis  -clinically improved; cont bronchodilators, steroids, add atx;   2. Constipation; last BM 12/22; cont bowel regimen; prn enema   3. Hypothyroidism; no recent TSH; cont levothyroxine, check tsh   Code Status: full Family Communication: d/w sister, mother at t he bedsdie (indicate person spoken with, relationship, and if by phone, the number) Disposition Plan: [facility] home   Consultants:  None   Procedures:  None   Antibiotics:  Levofloxacin 12/*24<<<< (indicate start date, and stop date if known)  HPI/Subjective: alert  Objective: Filed Vitals:   07/13/13 0620  BP: 125/57  Pulse: 114  Temp: 98.6 F (37 C)  Resp: 31    Intake/Output Summary (Last 24 hours) at 07/13/13 1104 Last data filed at 07/13/13 0800  Gross per 24 hour  Intake      0 ml  Output    450 ml  Net   -450 ml   There were no vitals filed for this visit.  Exam:   General:  alert  Cardiovascular: s1,s2 rrr  Respiratory: BL rhonchi improving   Abdomen: soft, nt nd  Musculoskeletal: no LE edema    Data Reviewed: Basic Metabolic Panel:  Recent Labs Lab 07/12/13 2240 07/13/13 0615  NA 134* 134*  K 4.5 4.6  CL 96 97  CO2 27 20  GLUCOSE 109* 181*  BUN 15 13  CREATININE 0.95 0.80  CALCIUM 8.6 8.7   Liver Function Tests: No results  found for this basename: AST, ALT, ALKPHOS, BILITOT, PROT, ALBUMIN,  in the last 168 hours No results found for this basename: LIPASE, AMYLASE,  in the last 168 hours No results found for this basename: AMMONIA,  in the last 168 hours CBC:  Recent Labs Lab 07/12/13 2240 07/13/13 0615  WBC 13.4* 13.2*  HGB 12.3 12.4  HCT 36.4 36.0  MCV 86.7 84.7  PLT 318 PLATELET CLUMPS NOTED ON SMEAR, UNABLE TO ESTIMATE   Cardiac Enzymes: No results found for this basename: CKTOTAL, CKMB, CKMBINDEX, TROPONINI,  in the last 168 hours BNP (last 3 results)  Recent Labs  07/12/13 2227  PROBNP 122.8   CBG: No results found for this basename: GLUCAP,  in the last 168 hours  Recent Results (from the past 240 hour(s))  MRSA PCR SCREENING     Status: None   Collection Time    07/13/13  2:14 AM      Result Value Range Status   MRSA by PCR NEGATIVE  NEGATIVE Final   Comment:            The GeneXpert MRSA Assay (FDA     approved for NASAL specimens     only), is one component of a     comprehensive MRSA colonization     surveillance program. It is not     intended to diagnose MRSA     infection nor to guide or     monitor treatment for     MRSA infections.  Studies: Dg Chest 2 View  07/12/2013   CLINICAL DATA:  Chest pain.  Abdominal pain.  Short of breath.  EXAM: CHEST  2 VIEW  COMPARISON:  None.  FINDINGS: Cardiopericardial silhouette within normal limits. Mediastinal contours normal. Trachea midline. No airspace disease or effusion. No free air is present underneath the hemidiaphragms. Gaseous distension of bowel is present.  IMPRESSION: No active cardiopulmonary disease.   Electronically Signed   By: Andreas Newport M.D.   On: 07/12/2013 23:16   Dg Abd 2 Views  07/12/2013   CLINICAL DATA:  Abdominal pain  EXAM: ABDOMEN - 2 VIEW  COMPARISON:  None available  FINDINGS: Moderately large amount of retained stool is present within the ascending, transverse, and descending colon. A mildly  prominent redundant loop of gas-filled sigmoid colon is seen within the right hemi abdomen measuring up to 7.1 cm in diameter. No dilated loops of small bowel identified. No frank evidence of obstruction or ileus. No free intraperitoneal air. No soft tissue masses or abnormal calcifications.  Mild degenerative changes noted within the lumbar spine. No acute osseous abnormality. Visualized portions of the lungs are clear.  IMPRESSION: 1. Mildly prominent gas-filled loop of sigmoid colon within the mid -right lower abdomen without frank evidence of obstruction or ileus. If there is clinical concern for possible developing sigmoid volvulus, a short interval follow-up radiograph could be performed to evaluate for interval change.  2. Moderately large amount of retained stool throughout the ascending, transverse, and descending colon.   Electronically Signed   By: Rise Mu M.D.   On: 07/12/2013 23:32    Scheduled Meds: . arformoterol  15 mcg Nebulization Q12H  . atorvastatin  20 mg Oral Daily  . budesonide  1 mg Nebulization Q12H  . clotrimazole   Topical BID  . divalproex  500 mg Oral BID  . docusate sodium  100 mg Oral BID  . FLUoxetine  40 mg Oral Daily  . fluticasone  2 spray Each Nare Daily  . heparin  5,000 Units Subcutaneous Q8H  . ipratropium  0.5 mg Nebulization Once  . levalbuterol  0.63 mg Nebulization Q6H  . levothyroxine  112 mcg Oral QAC breakfast  . methylPREDNISolone (SOLU-MEDROL) injection  40 mg Intravenous Q6H  . mirtazapine  30 mg Oral QHS  . montelukast  10 mg Oral QHS  . naltrexone  100 mg Oral BID  . norgestimate-ethinyl estradiol  1 tablet Oral Daily  . OLANZapine  20 mg Oral QHS  . pantoprazole  40 mg Oral Daily  . rOPINIRole  2 mg Oral QHS  . sodium chloride  3 mL Intravenous Q12H  . 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 If 7PM-7AM, please contact night-coverage at [email or web address], password [facility] 07/13/2013, 11:04 AM  LOS: 1 day

## 2013-07-13 NOTE — Progress Notes (Signed)
Unit CM UR Completed by [facility] ED CM  W. Madelene Kaatz RN  

## 2013-07-13 NOTE — ED Notes (Signed)
Dr Le at bedside.  

## 2013-07-14 DIAGNOSIS — E039 Hypothyroidism, unspecified: Secondary | ICD-10-CM

## 2013-07-14 LAB — URINALYSIS, ROUTINE W REFLEX MICROSCOPIC
Leukocytes, UA: NEGATIVE
Nitrite: NEGATIVE
Protein, ur: NEGATIVE mg/dL
Specific Gravity, Urine: 1.021 (ref 1.005–1.030)
Urobilinogen, UA: 0.2 mg/dL (ref 0.0–1.0)

## 2013-07-14 LAB — URINE MICROSCOPIC-ADD ON

## 2013-07-14 MED ORDER — METHYLPREDNISOLONE SODIUM SUCC 40 MG IJ SOLR
40.0000 mg | Freq: Two times a day (BID) | INTRAMUSCULAR | Status: DC
  Administered 2013-07-15 – 2013-07-16 (×4): 40 mg via INTRAVENOUS
  Filled 2013-07-14 (×7): qty 1

## 2013-07-14 MED ORDER — LEVOFLOXACIN 750 MG PO TABS
750.0000 mg | ORAL_TABLET | ORAL | Status: DC
  Administered 2013-07-15: 750 mg via ORAL
  Filled 2013-07-14 (×2): qty 1

## 2013-07-14 MED ORDER — BISACODYL 10 MG RE SUPP
10.0000 mg | Freq: Once | RECTAL | Status: AC
  Administered 2013-07-14: 10 mg via RECTAL
  Filled 2013-07-14: qty 1

## 2013-07-14 NOTE — Progress Notes (Signed)
[facility] PROGRESS NOTE  Angela Nicholson WGN:[id number] DOB: 03-Mar-1971 DOA: 07/12/2013 PCP: Jeanmarie Plant, MD  Assessment/Plan: 42 y/o female with PMH of deafmutism, cerebral palsy, asthma, schizoaffective disorder, gerd, hpl presented with SOB, productive cough, fever, chills admitted with asthma exacerbation  Asthma with status asthmaticus . Hypothyroidism . Cerebral palsy . Schizo affective schizophrenia . 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 Deafmutism . Anxiety . Status asthmaticus     1. Acute asthma exacerbation ' ABG 7.5-26-105-25; lung: Rales, rhonchi  BL; SIRS/leukocytosis  -clinically improved; cont bronchodilators, steroids, added atx;   2. Constipation; last BM 12/22; cont bowel regimen; prn enema   3. Hypothyroidism; no recent TSH; cont levothyroxine, check tsh -3.2  TF to RNF   Code Status: full Family Communication: d/w sister, mother at the bedsdie (indicate person spoken with, relationship, and if by phone, the number) Disposition Plan: [facility] home   Consultants:  None   Procedures:  None   Antibiotics:  Levofloxacin 12/*24<<<< (indicate start date, and stop date if known)  HPI/Subjective: alert  Objective: Filed Vitals:   07/14/13 0720  BP: 119/53  Pulse: 97  Temp: 98 F (36.7 C)  Resp: 20    Intake/Output Summary (Last 24 hours) at 07/14/13 0955 Last data filed at 07/14/13 0400  Gross per 24 hour  Intake      0 ml  Output    950 ml  Net   -950 ml   Filed Weights   07/13/13 1100  Weight: 58 kg (127 lb 13.9 oz)    Exam:   General:  alert  Cardiovascular: s1,s2 rrr  Respiratory: BL rhonchi improving   Abdomen: soft, nt nd  Musculoskeletal: no LE edema    Data Reviewed: Basic Metabolic Panel:  Recent Labs Lab 07/12/13 2240 07/13/13 0615  NA 134* 134*  K 4.5 4.6  CL 96 97  CO2 27 20  GLUCOSE 109* 181*  BUN 15 13  CREATININE 0.95 0.80  CALCIUM 8.6 8.7    Liver Function Tests: No results found for this basename: AST, ALT, ALKPHOS, BILITOT, PROT, ALBUMIN,  in the last 168 hours No results found for this basename: LIPASE, AMYLASE,  in the last 168 hours No results found for this basename: AMMONIA,  in the last 168 hours CBC:  Recent Labs Lab 07/12/13 2240 07/13/13 0615  WBC 13.4* 13.2*  HGB 12.3 12.4  HCT 36.4 36.0  MCV 86.7 84.7  PLT 318 PLATELET CLUMPS NOTED ON SMEAR, UNABLE TO ESTIMATE   Cardiac Enzymes: No results found for this basename: CKTOTAL, CKMB, CKMBINDEX, TROPONINI,  in the last 168 hours BNP (last 3 results)  Recent Labs  07/12/13 2227  PROBNP 122.8   CBG: No results found for this basename: GLUCAP,  in the last 168 hours  Recent Results (from the past 240 hour(s))  MRSA PCR SCREENING     Status: None   Collection Time    07/13/13  2:14 AM      Result Value Range Status   MRSA by PCR NEGATIVE  NEGATIVE Final   Comment:            The GeneXpert MRSA Assay (FDA     approved for NASAL specimens     only), is one component of a     comprehensive MRSA colonization     surveillance program. It is not     intended to diagnose MRSA     infection nor to guide or  monitor treatment for     MRSA infections.     Studies: Dg Chest 2 View  07/12/2013   CLINICAL DATA:  Chest pain.  Abdominal pain.  Short of breath.  EXAM: CHEST  2 VIEW  COMPARISON:  None.  FINDINGS: Cardiopericardial silhouette within normal limits. Mediastinal contours normal. Trachea midline. No airspace disease or effusion. No free air is present underneath the hemidiaphragms. Gaseous distension of bowel is present.  IMPRESSION: No active cardiopulmonary disease.   Electronically Signed   By: Andreas Newport M.D.   On: 07/12/2013 23:16   Dg Abd 2 Views  07/12/2013   CLINICAL DATA:  Abdominal pain  EXAM: ABDOMEN - 2 VIEW  COMPARISON:  None available  FINDINGS: Moderately large amount of retained stool is present within the ascending,  transverse, and descending colon. A mildly prominent redundant loop of gas-filled sigmoid colon is seen within the right hemi abdomen measuring up to 7.1 cm in diameter. No dilated loops of small bowel identified. No frank evidence of obstruction or ileus. No free intraperitoneal air. No soft tissue masses or abnormal calcifications.  Mild degenerative changes noted within the lumbar spine. No acute osseous abnormality. Visualized portions of the lungs are clear.  IMPRESSION: 1. Mildly prominent gas-filled loop of sigmoid colon within the mid -right lower abdomen without frank evidence of obstruction or ileus. If there is clinical concern for possible developing sigmoid volvulus, a short interval follow-up radiograph could be performed to evaluate for interval change.  2. Moderately large amount of retained stool throughout the ascending, transverse, and descending colon.   Electronically Signed   By: Rise Mu M.D.   On: 07/12/2013 23:32    Scheduled Meds: . arformoterol  15 mcg Nebulization Q12H  . atorvastatin  20 mg Oral Daily  . bisacodyl  10 mg Rectal Once  . budesonide  1 mg Nebulization Q12H  . clotrimazole   Topical BID  . divalproex  500 mg Oral BID  . docusate sodium  100 mg Oral BID  . FLUoxetine  40 mg Oral Daily  . fluticasone  2 spray Each Nare Daily  . heparin  5,000 Units Subcutaneous Q8H  . ipratropium  0.5 mg Nebulization Once  . levalbuterol  0.63 mg Nebulization Q6H  . levofloxacin (LEVAQUIN) IV  750 mg Intravenous Q24H  . levothyroxine  112 mcg Oral QAC breakfast  . methylPREDNISolone (SOLU-MEDROL) injection  40 mg Intravenous Q6H  . mirtazapine  30 mg Oral QHS  . montelukast  10 mg Oral QHS  . naltrexone  100 mg Oral BID  . norgestimate-ethinyl estradiol  1 tablet Oral Daily  . OLANZapine  20 mg Oral QHS  . pantoprazole  40 mg Oral Daily  . rOPINIRole  2 mg Oral QHS  . sodium chloride  3 mL Intravenous Q12H  . 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 If 7PM-7AM, please contact night-coverage at [email or web address], password [facility] 07/14/2013, 9:55 AM  LOS: 2 days

## 2013-07-14 NOTE — Progress Notes (Signed)
Report called to Tammy, receiving RN on 4N.  Pt transferred to 4N18 via wheelchair with all belongings. Pt's family members accompanied to new room.  Roselie Awkward, RN

## 2013-07-14 NOTE — Progress Notes (Signed)
Pt transferred from the ED around 0200, admitted to Rm/3s15. Lives in group home but currently visiting family in [place]. She is alert and oriented, however she is deaf and requires sign language interpreter. No skin breakdown noted. Placed on telemetry, currently ST. Pt was very anxious and agitated with complaints of leg pain at time of admission. MD made aware, orders received, pt resting comfortably at this time. Mother at bedside, instructed to call for assistance before getting out of bed. Will continue to monitor

## 2013-07-14 NOTE — Progress Notes (Signed)
PHARMACIST - PHYSICIAN COMMUNICATION DR:  York Spaniel and colleagues CONCERNING: Antibiotic IV to Oral Route Change Policy  RECOMMENDATION: This patient is receivingLevaquin by the intravenous route.  Based on criteria approved by the Pharmacy and Therapeutics Committee, the antibiotic(s) is/are being converted to the equivalent oral dose form(s).   DESCRIPTION: These criteria include:  Patient being treated for a respiratory tract infection, urinary tract infection, cellulitis or clostridium difficile associated diarrhea if on metronidazole  The patient is not neutropenic and does not exhibit a GI malabsorption state  The patient is eating (either orally or via tube) and/or has been taking other orally administered medications for a least 24 hours  The patient is improving clinically and has a Tmax < 100.5  If you have questions about this conversion, please contact the Pharmacy Department  []   [phone] )  [facility] [x]   [phone] )  [facility]  []   [phone] )  [facility] []   [phone] )  [facility]   Christoper Fabian, PharmD, BCPS Clinical pharmacist, pager [phone] 07/14/2013  2:11 PM

## 2013-07-15 NOTE — Progress Notes (Signed)
Called sign language interpretor per mother request to explain safe eating habit to prevent aspiration as patient tries to communicate while eating and cough. Winona Legato from agency in at 14:45 and to 1500.

## 2013-07-15 NOTE — Progress Notes (Signed)
[facility] PROGRESS NOTE  Angela Nicholson ZOX:[id number] DOB: 08-Dec-1970 DOA: 07/12/2013 PCP: Jeanmarie Plant, MD  Assessment/Plan: 42 y/o female with PMH of deafmutism, cerebral palsy, asthma, schizoaffective disorder, gerd, hpl presented with SOB, productive cough, fever, chills admitted with asthma exacerbation  Asthma with status asthmaticus . Hypothyroidism . Cerebral palsy . Schizo affective schizophrenia . 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 Deafmutism . Anxiety . Status asthmaticus     1. Acute asthma exacerbation ' ABG 7.5-26-105-25; lung: Rales, rhonchi  BL; SIRS/leukocytosis  -clinically improving; cont bronchodilators, steroids, added atx;   2. Constipation; last BM 12/22; cont bowel regimen; resolved; prn enema   3. Hypothyroidism; no recent TSH; cont levothyroxine, check tsh -3.2  Likely d/c in AM tomorrow   Code Status: full Family Communication: d/w sister, mother at the bedsdie (indicate person spoken with, relationship, and if by phone, the number) Disposition Plan: [facility] home likely on 12/27   Consultants:  None   Procedures:  None   Antibiotics:  Levofloxacin 12/*24<<<< (indicate start date, and stop date if known)  HPI/Subjective: alert  Objective: Filed Vitals:   07/15/13 0620  BP: 127/85  Pulse: 94  Temp: 98.5 F (36.9 C)  Resp: 19   No intake or output data in the 24 hours ending 07/15/13 1001 Filed Weights   07/13/13 1100 07/14/13 1246  Weight: 58 kg (127 lb 13.9 oz) 54.296 kg (119 lb 11.2 oz)    Exam:   General:  alert  Cardiovascular: s1,s2 rrr  Respiratory: BL rhonchi improving   Abdomen: soft, nt nd  Musculoskeletal: no LE edema    Data Reviewed: Basic Metabolic Panel:  Recent Labs Lab 07/12/13 2240 07/13/13 0615  NA 134* 134*  K 4.5 4.6  CL 96 97  CO2 27 20  GLUCOSE 109* 181*  BUN 15 13  CREATININE 0.95 0.80  CALCIUM 8.6 8.7   Liver Function  Tests: No results found for this basename: AST, ALT, ALKPHOS, BILITOT, PROT, ALBUMIN,  in the last 168 hours No results found for this basename: LIPASE, AMYLASE,  in the last 168 hours No results found for this basename: AMMONIA,  in the last 168 hours CBC:  Recent Labs Lab 07/12/13 2240 07/13/13 0615  WBC 13.4* 13.2*  HGB 12.3 12.4  HCT 36.4 36.0  MCV 86.7 84.7  PLT 318 PLATELET CLUMPS NOTED ON SMEAR, UNABLE TO ESTIMATE   Cardiac Enzymes: No results found for this basename: CKTOTAL, CKMB, CKMBINDEX, TROPONINI,  in the last 168 hours BNP (last 3 results)  Recent Labs  07/12/13 2227  PROBNP 122.8   CBG: No results found for this basename: GLUCAP,  in the last 168 hours  Recent Results (from the past 240 hour(s))  MRSA PCR SCREENING     Status: None   Collection Time    07/13/13  2:14 AM      Result Value Range Status   MRSA by PCR NEGATIVE  NEGATIVE Final   Comment:            The GeneXpert MRSA Assay (FDA     approved for NASAL specimens     only), is one component of a     comprehensive MRSA colonization     surveillance program. It is not     intended to diagnose MRSA     infection nor to guide or     monitor treatment for     MRSA infections.     Studies: No  results found.  Scheduled Meds: . arformoterol  15 mcg Nebulization Q12H  . atorvastatin  20 mg Oral Daily  . budesonide  1 mg Nebulization Q12H  . clotrimazole   Topical BID  . divalproex  500 mg Oral BID  . docusate sodium  100 mg Oral BID  . FLUoxetine  40 mg Oral Daily  . fluticasone  2 spray Each Nare Daily  . heparin  5,000 Units Subcutaneous Q8H  . ipratropium  0.5 mg Nebulization Once  . levalbuterol  0.63 mg Nebulization Q6H  . levofloxacin  750 mg Oral Q24H  . levothyroxine  112 mcg Oral QAC breakfast  . methylPREDNISolone (SOLU-MEDROL) injection  40 mg Intravenous Q12H  . mirtazapine  30 mg Oral QHS  . montelukast  10 mg Oral QHS  . naltrexone  100 mg Oral BID  .  norgestimate-ethinyl estradiol  1 tablet Oral Daily  . OLANZapine  20 mg Oral QHS  . pantoprazole  40 mg Oral Daily  . rOPINIRole  2 mg Oral QHS  . 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 If 7PM-7AM, please contact night-coverage at [email or web address], password [facility] 07/15/2013, 10:01 AM  LOS: 3 days

## 2013-07-16 MED ORDER — PREDNISONE 10 MG PO TABS: 10.0000 mg | ORAL_TABLET | Freq: Every day | ORAL | Status: DC

## 2013-07-16 MED ORDER — LEVOFLOXACIN 750 MG PO TABS: 750.0000 mg | ORAL_TABLET | ORAL | Status: DC

## 2013-07-16 NOTE — Discharge Summary (Signed)
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 Hypothyroidism . Cerebral palsy . Schizo affective schizophrenia . 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 Deafmutism . Anxiety . Status asthmaticus    1. Acute asthma exacerbation ' ABG 7.5-26-105-25; lung: Rales, rhonchi BL; SIRS/leukocytosis  -clinically improved on atx, steroids,  Bronchodilators; recommended taper steroids, OP atx, and f/u with PCP in 1-2 weeks  2. Constipation; last BM 12/22; cont bowel regimen; resolved;  3. Hypothyroidism; no recent TSH; cont levothyroxine, check tsh -3.2     Procedures:  none (i.e. Studies not automatically included, echos, thoracentesis, etc; not x-rays)  Consultations:  None 3  Discharge Exam: Filed Vitals:   07/16/13 0500  BP: 114/71  Pulse: 73  Temp: 97.7 F (36.5 C)  Resp: 20    General: alert Cardiovascular: s1,s2 rrr Respiratory: cta bl  Discharge Instructions  Discharge  Orders   Future Orders Complete By Expires   Diet - low sodium heart healthy  As directed    Discharge instructions  As directed    Comments:     Please follow up with PCP in 1-2 weeks   Increase activity slowly  As directed        Medication List         albuterol (2.5 MG/3ML) 0.083% nebulizer solution  Commonly known as:  PROVENTIL  Take 2.5 mg by nebulization every 4 (four) hours as needed for wheezing or shortness of breath.     albuterol 108 (90 BASE) MCG/ACT inhaler  Commonly known as:  PROVENTIL HFA;VENTOLIN HFA  Inhale 2 puffs into the lungs every 6 (six) hours as needed for wheezing or shortness of breath.     atorvastatin 20 MG tablet  Commonly known as:  LIPITOR  Take 20 mg by mouth daily.     budesonide 1 MG/2ML nebulizer solution  Commonly known as:  PULMICORT  Take 1 mg by nebulization every 12 (twelve) hours.     cetirizine 10 MG tablet  Commonly known as:  ZYRTEC  Take 10 mg by mouth at bedtime.     clotrimazole-betamethasone cream  Commonly known as:  LOTRISONE  Apply 1 application topically as needed.     divalproex 250 MG 24 hr tablet  Commonly known as:  DEPAKOTE ER  Take 500 mg by mouth 2 (two) times daily.     FLUoxetine 40 MG capsule  Commonly known as:  PROZAC  Take 40 mg by mouth daily.     fluticasone 50 MCG/ACT nasal spray  Commonly known as:  FLONASE  Place 2 sprays into both nostrils daily.     levofloxacin 750 MG tablet  Commonly known as:  LEVAQUIN  Take 1 tablet (750 mg total) by mouth daily.     levothyroxine 112 MCG tablet  Commonly known as:  SYNTHROID, LEVOTHROID  Take 112 mcg by mouth daily before breakfast.     miconazole 2 % cream  Commonly known as:  MICOTIN  Apply 1 application topically daily.     mirtazapine 30 MG tablet  Commonly known as:  REMERON  Take 30 mg by mouth at bedtime.     montelukast 10 MG tablet  Commonly known as:  SINGULAIR  Take 10 mg by mouth at bedtime.     multivitamin with minerals  Tabs tablet  Take 1 tablet by mouth daily.     naltrexone 50 MG tablet  Commonly known as:  DEPADE  Take 100 mg by mouth 2 (two) times daily.     OLANZapine 20 MG tablet  Commonly known as:  ZYPREXA  Take 20 mg by mouth at bedtime.     omeprazole 40 MG capsule  Commonly known as:  PRILOSEC  Take 40 mg by mouth 2 (two) times daily.     ORTHO-CYCLEN (28) 0.25-35 MG-MCG tablet  Generic drug:  norgestimate-ethinyl estradiol  Take 1 tablet by mouth daily.     PERFOROMIST 20 MCG/2ML nebulizer solution  Generic drug:  formoterol  Take 20 mcg by nebulization 2 (two) times daily.     predniSONE 10 MG tablet  Commonly known as:  DELTASONE  Take 1 tablet (10 mg total) by mouth daily with breakfast.     rOPINIRole 1 MG tablet  Commonly known as:  REQUIP  Take 2 mg by mouth at bedtime.     tetrahydrozoline 0.05 % ophthalmic solution  Place 2 drops into both eyes 2 (two) times daily as needed (for irritation).       Allergies  Allergen Reactions  . Penicillins Rash       Follow-up Information   Follow up with [facility], MD In 1 week.   Specialty:  Family Medicine   Contact information:   [address] [place] [address] [phone]        The results of significant diagnostics from this hospitalization (including imaging, microbiology, ancillary and laboratory) are listed below for reference.    Significant Diagnostic Studies: Dg Chest 2 View  07/12/2013   CLINICAL DATA:  Chest pain.  Abdominal pain.  Short of breath.  EXAM: CHEST  2 VIEW  COMPARISON:  None.  FINDINGS: Cardiopericardial silhouette within normal limits. Mediastinal contours normal. Trachea midline. No airspace disease or effusion. No free air is present underneath the hemidiaphragms. Gaseous distension of bowel is present.  IMPRESSION: No active cardiopulmonary disease.   Electronically Signed   By: Andreas Newport M.D.   On: 07/12/2013 23:16   Dg Abd 2 Views  07/12/2013   CLINICAL DATA:   Abdominal pain  EXAM: ABDOMEN - 2 VIEW  COMPARISON:  None available  FINDINGS: Moderately large amount of retained stool is present within the ascending, transverse, and descending colon. A mildly prominent redundant loop of gas-filled sigmoid colon is seen within the right hemi abdomen measuring up to 7.1 cm in diameter. No dilated loops of small bowel identified. No frank evidence of obstruction or ileus. No free intraperitoneal air. No soft tissue masses or abnormal calcifications.  Mild degenerative changes noted within the lumbar spine. No acute osseous abnormality. Visualized  portions of the lungs are clear.  IMPRESSION: 1. Mildly prominent gas-filled loop of sigmoid colon within the mid -right lower abdomen without frank evidence of obstruction or ileus. If there is clinical concern for possible developing sigmoid volvulus, a short interval follow-up radiograph could be performed to evaluate for interval change.  2. Moderately large amount of retained stool throughout the ascending, transverse, and descending colon.   Electronically Signed   By: Rise Mu M.D.   On: 07/12/2013 23:32    Microbiology: Recent Results (from the past 240 hour(s))  MRSA PCR SCREENING     Status: None   Collection Time    07/13/13  2:14 AM      Result Value Range Status   MRSA by PCR NEGATIVE  NEGATIVE Final   Comment:            The GeneXpert MRSA Assay (FDA     approved for NASAL specimens     only), is one component of a     comprehensive MRSA colonization     surveillance program. It is not     intended to diagnose MRSA     infection nor to guide or     monitor treatment for     MRSA infections.     Labs: Basic Metabolic Panel:  Recent Labs Lab 07/12/13 2240 07/13/13 0615  NA 134* 134*  K 4.5 4.6  CL 96 97  CO2 27 20  GLUCOSE 109* 181*  BUN 15 13  CREATININE 0.95 0.80  CALCIUM 8.6 8.7   Liver Function Tests: No results found for this basename: AST, ALT, ALKPHOS, BILITOT, PROT,  ALBUMIN,  in the last 168 hours No results found for this basename: LIPASE, AMYLASE,  in the last 168 hours No results found for this basename: AMMONIA,  in the last 168 hours CBC:  Recent Labs Lab 07/12/13 2240 07/13/13 0615  WBC 13.4* 13.2*  HGB 12.3 12.4  HCT 36.4 36.0  MCV 86.7 84.7  PLT 318 PLATELET CLUMPS NOTED ON SMEAR, UNABLE TO ESTIMATE   Cardiac Enzymes: No results found for this basename: CKTOTAL, CKMB, CKMBINDEX, TROPONINI,  in the last 168 hours BNP: BNP (last 3 results)  Recent Labs  07/12/13 2227  PROBNP 122.8   CBG: No results found for this basename: GLUCAP,  in the last 168 hours     Signed:  Esperanza Sheets  [facility] 07/16/2013, 10:41 AM

## 2013-07-16 NOTE — Progress Notes (Addendum)
Mom says patient is from [facility] in [place], which is an all-deaf facility. She has concerns that if her daughter has another asthma attack, that others will not hear her needing help...wants to talk to a case manager regarding discharge. Order placed  Patient's mother says program is run by the [place] [facility] program, [phone] or [phone]. Mother has a business card for the service program for the case manager to see...  Addendum 1330: after patient's mother spoke with doctor regarding discharge, she says she feels comfortable taking daughter home today and will take her to [facility] tomorrow. She wants to ask her primary care doctor at the followup appt about how often she should have appts to check on her breathing. She does not request to speak to case manager at this time.

## 2014-06-14 ENCOUNTER — Emergency Department (HOSPITAL_BASED_OUTPATIENT_CLINIC_OR_DEPARTMENT_OTHER): Payer: Medicaid Other

## 2014-06-14 ENCOUNTER — Emergency Department (HOSPITAL_BASED_OUTPATIENT_CLINIC_OR_DEPARTMENT_OTHER)
Admission: EM | Admit: 2014-06-14 | Discharge: 2014-06-14 | Disposition: A | Discharge status: Home / Self Care | Payer: Medicaid Other | Attending: Emergency Medicine | Admitting: Emergency Medicine

## 2014-06-14 ENCOUNTER — Encounter (HOSPITAL_BASED_OUTPATIENT_CLINIC_OR_DEPARTMENT_OTHER): Payer: Self-pay | Admitting: Emergency Medicine

## 2014-06-14 DIAGNOSIS — R059 Cough, unspecified: Secondary | ICD-10-CM

## 2014-06-14 DIAGNOSIS — Z880 Allergy status to penicillin: Secondary | ICD-10-CM | POA: Diagnosis not present

## 2014-06-14 DIAGNOSIS — H9190 Unspecified hearing loss, unspecified ear: Secondary | ICD-10-CM | POA: Diagnosis not present

## 2014-06-14 DIAGNOSIS — R05 Cough: Secondary | ICD-10-CM | POA: Diagnosis present

## 2014-06-14 DIAGNOSIS — Z79899 Other long term (current) drug therapy: Secondary | ICD-10-CM | POA: Insufficient documentation

## 2014-06-14 DIAGNOSIS — N912 Amenorrhea, unspecified: Secondary | ICD-10-CM | POA: Diagnosis not present

## 2014-06-14 DIAGNOSIS — J45901 Unspecified asthma with (acute) exacerbation: Secondary | ICD-10-CM | POA: Diagnosis not present

## 2014-06-14 DIAGNOSIS — F250 Schizoaffective disorder, bipolar type: Secondary | ICD-10-CM | POA: Diagnosis not present

## 2014-06-14 HISTORY — DX: Gastro-esophageal reflux disease without esophagitis: K21.9

## 2014-06-14 HISTORY — DX: Unspecified hearing loss, unspecified ear: H91.90

## 2014-06-14 HISTORY — DX: Barrett's esophagus without dysplasia: K22.70

## 2014-06-14 MED ORDER — ALBUTEROL SULFATE (2.5 MG/3ML) 0.083% IN NEBU
5.0000 mg | INHALATION_SOLUTION | Freq: Once | RESPIRATORY_TRACT | Status: AC
  Administered 2014-06-14: 5 mg via RESPIRATORY_TRACT
  Filled 2014-06-14: qty 6

## 2014-06-14 MED ORDER — PREDNISONE 10 MG PO TABS: 40.0000 mg | ORAL_TABLET | Freq: Every day | ORAL | Status: DC

## 2014-06-14 MED ORDER — PREDNISONE 20 MG PO TABS
40.0000 mg | ORAL_TABLET | Freq: Once | ORAL | Status: AC
  Administered 2014-06-14: 40 mg via ORAL
  Filled 2014-06-14: qty 2

## 2014-06-14 NOTE — Discharge Instructions (Signed)
Asthma Use albuterol every 4 hours as needed for cough or wheeze. Return if needed more than every 4 hours, or see your primary care physician. Start taking the prednisone prescribed here tomorrow Asthma is a condition of the lungs in which the airways tighten and narrow. Asthma can make it hard to breathe. Asthma cannot be cured, but medicine and lifestyle changes can help control it. Asthma may be started (triggered) by:  Animal skin flakes (dander).  Dust.  Cockroaches.  Pollen.  Mold.  Smoke.  Cleaning products.  Hair sprays or aerosol sprays.  Paint fumes or strong smells.  Cold air, weather changes, and winds.  Crying or laughing hard.  Stress.  Certain medicines or drugs.  Foods, such as dried fruit, potato chips, and sparkling grape juice.  Infections or conditions (colds, flu).  Exercise.  Certain medical conditions or diseases.  Exercise or tiring activities. HOME CARE   Take medicine as told by your doctor.  Use a peak flow meter as told by your doctor. A peak flow meter is a tool that measures how well the lungs are working.  Record and keep track of the peak flow meter's readings.  Understand and use the asthma action plan. An asthma action plan is a written plan for taking care of your asthma and treating your attacks.  To help prevent asthma attacks:  Do not smoke. Stay away from secondhand smoke.  Change your heating and air conditioning filter often.  Limit your use of fireplaces and wood stoves.  Get rid of pests (such as roaches and mice) and their droppings.  Throw away plants if you see mold on them.  Clean your floors. Dust regularly. Use cleaning products that do not smell.  Have someone vacuum when you are not home. Use a vacuum cleaner with a HEPA filter if possible.  Replace carpet with wood, tile, or vinyl flooring. Carpet can trap animal skin flakes and dust.  Use allergy-proof pillows, mattress covers, and box spring  covers.  Wash bed sheets and blankets every week in hot water and dry them in a dryer.  Use blankets that are made of polyester or cotton.  Clean bathrooms and kitchens with bleach. If possible, have someone repaint the walls in these rooms with mold-resistant paint. Keep out of the rooms that are being cleaned and painted.  Wash hands often. GET HELP IF:  You have make a whistling sound when breaking (wheeze), have shortness of breath, or have a cough even if taking medicine to prevent attacks.  The colored mucus you cough up (sputum) is thicker than usual.  The colored mucus you cough up changes from clear or white to yellow, green, gray, or bloody.  You have problems from the medicine you are taking such as:  A rash.  Itching.  Swelling.  Trouble breathing.  You need reliever medicines more than 2-3 times a week.  Your peak flow measurement is still at 50-79% of your personal best after following the action plan for 1 hour.  You have a fever. GET HELP RIGHT AWAY IF:   You seem to be worse and are not responding to medicine during an asthma attack.  You are short of breath even at rest.  You get short of breath when doing very little activity.  You have trouble eating, drinking, or talking.  You have chest pain.  You have a fast heartbeat.  Your lips or fingernails start to turn blue.  You are light-headed, dizzy, or faint.  Your peak flow is less than 50% of your personal best. MAKE SURE YOU:   Understand these instructions.  Will watch your condition.  Will get help right away if you are not doing well or get worse. Document Released: 12/24/2007 Document Revised: 11/21/2013 Document Reviewed: 02/03/2013 [facility] Patient Information 2015 [place], [place]. This information is not intended to replace advice given to you by your health care provider. Make sure you discuss any questions you have with your health care provider.

## 2014-06-14 NOTE — ED Notes (Signed)
Worsening productive cough and wheezing. Pt is deaf and Mom is translating. Mom sts that pt "always coughs" and cannot give a time frame in which it has been worsening.

## 2014-06-14 NOTE — ED Notes (Signed)
MD at bedside. 

## 2014-06-14 NOTE — ED Provider Notes (Signed)
CSN: [id number]     Arrival date & time 06/14/14  1511 History   First MD Initiated Contact with Patient 06/14/14 1519     Chief Complaint  Patient presents with  . Cough   History is obtained from patient. Her mother is acting as interpreter using sign language as patient is deaf  (Consider location/radiation/quality/duration/timing/severity/associated sxs/prior Treatment) HPI Patient complains of productive cough onset 2 days ago initially blood-tinged yellow sputum today with yellow sputum only symptoms accompanied by shortness of breath and wheezing. She's been treating herself with albuterol and performist nebulizers. She is not presently on prednisone however prednisone has helped her in the past. Feels like asthma she's had in the past. No fever no nausea or vomiting no other associated symptoms. Past Medical History  Diagnosis Date  . Asthma   . Schizo affective schizophrenia   . Deaf    congenital deafness born premature History reviewed. No pertinent past surgical history. No family history on file. History  Substance Use Topics  . Smoking status: Never Smoker   . Smokeless tobacco: Never Used  . Alcohol Use: No   OB History    No data available     Review of Systems  Constitutional: Negative.   HENT: Positive for hearing loss.   Respiratory: Positive for cough, shortness of breath and wheezing.   Cardiovascular: Negative.   Gastrointestinal: Negative.   Genitourinary:       Amenorrheic  Musculoskeletal: Negative.   Skin: Negative.   Neurological: Negative.   Psychiatric/Behavioral: Negative.   All other systems reviewed and are negative.     Allergies  Penicillins  Home Medications   Prior to Admission medications   Medication Sig Start Date End Date Taking? Authorizing Provider  albuterol (PROVENTIL HFA;VENTOLIN HFA) 108 (90 BASE) MCG/ACT inhaler Inhale 2 puffs into the lungs every 6 (six) hours as needed for wheezing or shortness of breath.     Historical Provider, MD  albuterol (PROVENTIL) (2.5 MG/3ML) 0.083% nebulizer solution Take 2.5 mg by nebulization every 4 (four) hours as needed for wheezing or shortness of breath.    Historical Provider, MD  atorvastatin (LIPITOR) 20 MG tablet Take 20 mg by mouth daily.    Historical Provider, MD  budesonide (PULMICORT) 1 MG/2ML nebulizer solution Take 1 mg by nebulization every 12 (twelve) hours.    Historical Provider, MD  cetirizine (ZYRTEC) 10 MG tablet Take 10 mg by mouth at bedtime.    Historical Provider, MD  clotrimazole-betamethasone (LOTRISONE) cream Apply 1 application topically as needed.    Historical Provider, MD  divalproex (DEPAKOTE ER) 250 MG 24 hr tablet Take 500 mg by mouth 2 (two) times daily.    Historical Provider, MD  FLUoxetine (PROZAC) 40 MG capsule Take 40 mg by mouth daily.    Historical Provider, MD  fluticasone (FLONASE) 50 MCG/ACT nasal spray Place 2 sprays into both nostrils daily.    Historical Provider, MD  formoterol (PERFOROMIST) 20 MCG/2ML nebulizer solution Take 20 mcg by nebulization 2 (two) times daily.    Historical Provider, MD  levofloxacin (LEVAQUIN) 750 MG tablet Take 1 tablet (750 mg total) by mouth daily. 07/16/13   Esperanza Sheets, MD  levothyroxine (SYNTHROID, LEVOTHROID) 112 MCG tablet Take 112 mcg by mouth daily before breakfast.    Historical Provider, MD  miconazole (MICOTIN) 2 % cream Apply 1 application topically daily.    Historical Provider, MD  mirtazapine (REMERON) 30 MG tablet Take 30 mg by mouth at bedtime.    Historical  Provider, MD  montelukast (SINGULAIR) 10 MG tablet Take 10 mg by mouth at bedtime.    Historical Provider, MD  Multiple Vitamin (MULTIVITAMIN WITH MINERALS) TABS tablet Take 1 tablet by mouth daily.    Historical Provider, MD  naltrexone (DEPADE) 50 MG tablet Take 100 mg by mouth 2 (two) times daily.    Historical Provider, MD  norgestimate-ethinyl estradiol (ORTHO-CYCLEN, 28,) 0.25-35 MG-MCG tablet Take 1 tablet by  mouth daily.    Historical Provider, MD  OLANZapine (ZYPREXA) 20 MG tablet Take 20 mg by mouth at bedtime.    Historical Provider, MD  omeprazole (PRILOSEC) 40 MG capsule Take 40 mg by mouth 2 (two) times daily.    Historical Provider, MD  predniSONE (DELTASONE) 10 MG tablet Take 1 tablet (10 mg total) by mouth daily with breakfast. 07/16/13   Esperanza SheetsUlugbek N Buriev, MD  rOPINIRole (REQUIP) 1 MG tablet Take 2 mg by mouth at bedtime.    Historical Provider, MD  tetrahydrozoline 0.05 % ophthalmic solution Place 2 drops into both eyes 2 (two) times daily as needed (for irritation).    Historical Provider, MD   BP 109/42 mmHg  Pulse 87  Temp(Src) 98.4 F (36.9 C) (Oral)  Resp 20  Ht 4\' 11"  (1.499 m)  Wt 123 lb (55.792 kg)  BMI 24.83 kg/m2  SpO2 96% Physical Exam  Constitutional: She appears well-developed and well-nourished. No distress.  HENT:  Head: Normocephalic and atraumatic.  Right Ear: External ear normal.  Left Ear: External ear normal.  Mouth/Throat: Oropharynx is clear and moist.  Eyes: Conjunctivae are normal. Pupils are equal, round, and reactive to light.  Neck: Neck supple. No tracheal deviation present. No thyromegaly present.  Cardiovascular: Normal rate and regular rhythm.   No murmur heard. Pulmonary/Chest: Effort normal and breath sounds normal.  Diffuse scant rhonchi. Expiratory wheezes. No respiratory distress  Abdominal: Soft. She exhibits no distension. There is no tenderness.  Musculoskeletal: Normal range of motion. She exhibits no edema or tenderness.  Neurological: She is alert. Coordination normal.  Skin: Skin is warm and dry. No rash noted.  Psychiatric: She has a normal mood and affect.  Nursing note and vitals reviewed.   ED Course  Procedures (including critical care time) Labs Review Labs Reviewed - No data to display  Imaging Review No results found.   EKG Interpretation None     Patient states breathing is improved after nebulized treatment  here. Chest x-ray viewed by me. Results for orders placed or performed during the hospital encounter of 07/12/13  MRSA PCR Screening  Result Value Ref Range   MRSA by PCR NEGATIVE NEGATIVE  Basic metabolic panel  Result Value Ref Range   Sodium 134 (L) 135 - 145 mEq/L   Potassium 4.5 3.5 - 5.1 mEq/L   Chloride 96 96 - 112 mEq/L   CO2 27 19 - 32 mEq/L   Glucose, Bld 109 (H) 70 - 99 mg/dL   BUN 15 6 - 23 mg/dL   Creatinine, Ser 7.820.95 0.50 - 1.10 mg/dL   Calcium 8.6 8.4 - 95.610.5 mg/dL   GFR calc non Af Amer 72 (L) >90 mL/min   GFR calc Af Amer 84 (L) >90 mL/min  CBC  Result Value Ref Range   WBC 13.4 (H) 4.0 - 10.5 K/uL   RBC 4.20 3.87 - 5.11 MIL/uL   Hemoglobin 12.3 12.0 - 15.0 g/dL   HCT 21.336.4 08.636.0 - 57.846.0 %   MCV 86.7 78.0 - 100.0 fL   MCH 29.3  26.0 - 34.0 pg   MCHC 33.8 30.0 - 36.0 g/dL   RDW 16.114.0 09.611.5 - 04.515.5 %   Platelets 318 150 - 400 K/uL  Pro b natriuretic peptide  Result Value Ref Range   Pro B Natriuretic peptide (BNP) 122.8 0 - 125 pg/mL  Urinalysis, Routine w reflex microscopic  Result Value Ref Range   Color, Urine YELLOW YELLOW   APPearance CLOUDY (A) CLEAR   Specific Gravity, Urine 1.021 1.005 - 1.030   pH 5.5 5.0 - 8.0   Glucose, UA NEGATIVE NEGATIVE mg/dL   Hgb urine dipstick TRACE (A) NEGATIVE   Bilirubin Urine NEGATIVE NEGATIVE   Ketones, ur NEGATIVE NEGATIVE mg/dL   Protein, ur NEGATIVE NEGATIVE mg/dL   Urobilinogen, UA 0.2 0.0 - 1.0 mg/dL   Nitrite NEGATIVE NEGATIVE   Leukocytes, UA NEGATIVE NEGATIVE  Basic metabolic panel  Result Value Ref Range   Sodium 134 (L) 135 - 145 mEq/L   Potassium 4.6 3.5 - 5.1 mEq/L   Chloride 97 96 - 112 mEq/L   CO2 20 19 - 32 mEq/L   Glucose, Bld 181 (H) 70 - 99 mg/dL   BUN 13 6 - 23 mg/dL   Creatinine, Ser 4.090.80 0.50 - 1.10 mg/dL   Calcium 8.7 8.4 - 81.110.5 mg/dL   GFR calc non Af Amer 89 (L) >90 mL/min   GFR calc Af Amer >90 >90 mL/min  CBC  Result Value Ref Range   WBC 13.2 (H) 4.0 - 10.5 K/uL   RBC 4.25 3.87 -  5.11 MIL/uL   Hemoglobin 12.4 12.0 - 15.0 g/dL   HCT 91.436.0 78.236.0 - 95.646.0 %   MCV 84.7 78.0 - 100.0 fL   MCH 29.2 26.0 - 34.0 pg   MCHC 34.4 30.0 - 36.0 g/dL   RDW 21.314.1 08.611.5 - 57.815.5 %   Platelets PLATELET CLUMPS NOTED ON SMEAR, UNABLE TO ESTIMATE 150 - 400 K/uL  TSH  Result Value Ref Range   TSH 3.244 0.350 - 4.500 uIU/mL  Urine microscopic-add on  Result Value Ref Range   Squamous Epithelial / LPF RARE RARE   RBC / HPF 0-2 <3 RBC/hpf   Bacteria, UA RARE RARE  CG4 I-STAT (Lactic acid)  Result Value Ref Range   Lactic Acid, Venous 1.47 0.5 - 2.2 mmol/L  POCT i-Stat troponin I  Result Value Ref Range   Troponin i, poc 0.01 0.00 - 0.08 ng/mL   Comment 3          I-STAT 3, blood gas (G3+)  Result Value Ref Range   pH, Arterial 7.582 (H) 7.350 - 7.450   pCO2 arterial 26.6 (L) 35.0 - 45.0 mmHg   pO2, Arterial 105.0 (H) 80.0 - 100.0 mmHg   Bicarbonate 25.1 (H) 20.0 - 24.0 mEq/L   TCO2 26 0 - 100 mmol/L   O2 Saturation 99.0 %   Acid-Base Excess 4.0 (H) 0.0 - 2.0 mmol/L   Patient temperature 99.0 F    Collection site RADIAL, ALLEN'S TEST ACCEPTABLE    Drawn by Operator    Sample type ARTERIAL    Dg Chest 2 View  06/14/2014   CLINICAL DATA:  Cough, congestion.  EXAM: CHEST  2 VIEW  COMPARISON:  07/12/2013  FINDINGS: The heart size and mediastinal contours are within normal limits. Both lungs are clear. The visualized skeletal structures are unremarkable.  IMPRESSION: No active cardiopulmonary disease.   Electronically Signed   By: Charlett NoseKevin  Dover M.D.   On: 06/14/2014 16:18  MDM  Plan prescription prednisone. She states the prednisone has helped her in the past. Use albuterol every 4 hours return if needed with every 4 hours or see primary care physician This acute asthmatic bronchitis Final diagnoses:  None        Doug Sou, MD 06/14/14 [phone]

## 2017-05-22 ENCOUNTER — Encounter: Payer: Self-pay | Admitting: Physical Therapy

## 2017-05-22 ENCOUNTER — Ambulatory Visit: Payer: Medicare Other | Attending: Family Medicine | Admitting: Physical Therapy

## 2017-05-22 DIAGNOSIS — G804 Ataxic cerebral palsy: Secondary | ICD-10-CM | POA: Diagnosis present

## 2017-05-22 DIAGNOSIS — R2681 Unsteadiness on feet: Secondary | ICD-10-CM | POA: Diagnosis present

## 2017-05-22 DIAGNOSIS — M6281 Muscle weakness (generalized): Secondary | ICD-10-CM | POA: Insufficient documentation

## 2017-05-22 DIAGNOSIS — R260 Ataxic gait: Secondary | ICD-10-CM | POA: Diagnosis present

## 2017-05-22 NOTE — Therapy (Signed)
[facility] [address] [place], [place], [address] Phone: [phone]   Fax:  [phone]  Physical Therapy Evaluation  Patient Details  Name: Angela Nicholson MRN: [id number] Date of Birth: [birth year] Referring Provider: Kip A. Corrington, MD [facility] Family Medicine  Encounter Date: 05/22/2017      PT End of Session - 05/22/17 1043    Visit Number 1   Number of Visits 4  eval + 3 visits   Date for PT Re-Evaluation 07/20/17   Authorization Type Medicaid; awaiting approval of first 3 visits  (will be adding Medicare soon)   Authorization - Visit Number 0   Authorization - Number of Visits 3   PT Start Time 0848   PT Stop Time 0935   PT Time Calculation (min) 47 min   Activity Tolerance Patient tolerated treatment well   Behavior During Therapy WFL for tasks assessed/performed      Past Medical History:  Diagnosis Date  . Asthma   . Barrett esophagus   . Deaf   . GERD (gastroesophageal reflux disease)   . Schizo affective schizophrenia (HCC)     History reviewed. No pertinent surgical history.  There were no vitals filed for this visit.       Subjective Assessment - 05/22/17 0857    Subjective Pt's mother with patient to provide history: Pt was living in a group home but had to move home and have PEG tube placed one year ago due to aspiration PNA, weight loss and decreased endurance.  Since PEG tube placed pt has increased her activity level and independece with activities at home (mother reports pt used to not come downstairs but is now) but continues to have decreased mm strength/tone and decreased endurance.  Pt's goal is to eventually be able to eat with her mouth but needs to work on strength and endurance.   Patient is accompained by: Family member;Interpreter   Pertinent History ataxic cerebral palsy, deaf, PEG tube, aspiration PNA, and asthma   Limitations Walking;Other (comment)   swallowing    Patient Stated Goals To get stronger and to eat with her mouth again.   Currently in Pain? No/denies            [facility] PT Assessment - 05/22/17 0905      Assessment   Medical Diagnosis muscular deconditioning; cerebral palsy   Referring Provider Kip A. Corrington, MD [facility] Family Medicine   Onset Date/Surgical Date 05/21/16   Prior Therapy PT as an infant, SLP recently due to aspiration PNA     Precautions   Precautions Other (comment)   Precaution Comments ataxic cerebral palsy, deaf, PEG tube, aspiration PNA, and asthma.       Balance Screen   Has the patient fallen in the past 6 months No   Has the patient had a decrease in activity level because of a fear of falling?  Yes   Is the patient reluctant to leave their home because of a fear of falling?  Yes  due to fatigue and decreased endurance     Home Environment   Living Environment Private residence   Living Arrangements Parent   Available Help at Discharge Family   Type of Home House   Home Access Level entry   Home Layout Two level;Bed/bath upstairs   Alternate Level Stairs-Number of Steps 7 + 7   Alternate Level Stairs-Rails Right   Home Equipment None     Prior Function  Level of Independence Independent   Leisure Baking and Art     Posture/Postural Control   Posture/Postural Control Postural limitations   Postural Limitations Flexed trunk;Forward head   Posture Comments pt reports having scoliosis; sits in flexed posture/slumped posture but flexible     ROM / Strength   AROM / PROM / Strength Strength     Strength   Overall Strength Deficits   Overall Strength Comments 3/5 hip flexion, 4/5 knee extension, 3+/5 ankle DF, and knee flexion bilat LE     Ambulation/Gait   Ambulation/Gait Yes   Ambulation/Gait Assistance 6: Modified independent (Device/Increase time)   Ambulation Distance (Feet) 115 Feet   Assistive device None   Gait Pattern Step-through  pattern;Decreased step length - right;Decreased step length - left;Decreased stride length;Ataxic;Lateral hip instability   Ambulation Surface Level;Indoor   Stairs Yes   Stairs Assistance 6: Modified independent (Device/Increase time)   Stair Management Technique Two rails;Step to pattern;Forwards   Number of Stairs 4   Height of Stairs 6     Standardized Balance Assessment   Standardized Balance Assessment Five Times Sit to Stand;10 meter walk test   Five times sit to stand comments  9.03 seconds   10 Meter Walk 7.84 seconds or 4.18 ft/sec            Objective measurements completed on examination: See above findings.                  PT Education - 05/22/17 1043    Education provided Yes   Education Details clinical findings, PT POC and goals; plan to ask for SLP evaluation   Person(s) Educated Patient;Parent(s)   Methods Explanation   Comprehension Verbalized understanding             PT Long Term Goals - 05/22/17 1052      PT LONG TERM GOAL #1   Title (Target for LTG is by 3rd visit)  Pt will perform HEP and walking program 3/7 days per week with supervision of mother   Baseline currently does not have a walking program or HEP   Time 3   Period Weeks   Status New     PT LONG TERM GOAL #2   Title Pt will improve muscle strength by 1/2 mm grade on MMT   Baseline 3/5 hip flexion, 3+/5 knee flexion, ankle DF, 4/5 knee extension   Time 3   Period Weeks   Status New     PT LONG TERM GOAL #3   Title Pt will increase walking distance by 100' during 6 minute walk test   Baseline Not assessed to date   Time 3   Period Weeks   Status New     PT LONG TERM GOAL #4   Title Pt will improve FGA by 4 points    Baseline not assessed to date   Time 3   Period Weeks     PT LONG TERM GOAL #5   Title Pt will negotiate 14 stairs with two rails with alternating sequence   Baseline 2 rails, step to sequence, 4 stairs   Time 3   Period Weeks   Status New      Additional Long Term Goals   Additional Long Term Goals Yes     PT LONG TERM GOAL #6   Title Pt will tolerate standing x 20 minutes while performing leisure activity (art/cooking/cross word)   Baseline Less than 5 minutes   Time 3   Period  Weeks   Status New     PT LONG TERM GOAL #7   Title Pt will report going out into the community for outings to [employer], shopping, grocery store with mom 3/7 days/week   Baseline pt has not been going to [employer], goes out 1-2x/week   Time 3   Period Weeks   Status New                Plan - June 06, 2017 1045    Clinical Impression Statement Pt is a 46 year old female presenting to OPPT neuro for evaluation of muscular deconditioning due to inability to swallow safely leading to multiple episodes of aspiration PNA and weight loss while living at a group home.  Pt's PMH significant for the following: ataxic cerebral palsy, deaf, PEG tube, aspiration PNA, and asthma. The following deficits were noted during pt's exam: impaired balance, impaired gait, impaired activity tolerance/endurance, impaired strength and posture.  Pt would benefit from skilled PT to address these impairments and functional limitations to mitigate deconditioning, improve postural strength for safe swallowing, maximize functional mobility independence and reduce falls risk.   History and Personal Factors relevant to plan of care: ataxic cerebral palsy, deaf, PEG tube, aspiration PNA, asthma, was living at group home and then had to move home due to deconditioning; working on regaining independence and ability to swallow safely   Clinical Presentation Evolving   Clinical Presentation due to: ataxic cerebral palsy, deaf, PEG tube, aspiration PNA, asthma, was living at group home and then had to move home due to deconditioning; working on regaining independence and ability to swallow safely   Clinical Decision Making Moderate   Rehab Potential Good   Clinical Impairments  Affecting Rehab Potential 1 year history of PEG Tube use for nutrition due to aspiration PNA   PT Frequency Other (comment)  3 initial visits + 12 through end of year   PT Duration Other (comment)  1x/week x 3 weeks; 2x/week x 4 weeks   PT Treatment/Interventions ADLs/Self Care Home Management;Gait training;Stair training;Functional mobility training;Therapeutic activities;Therapeutic exercise;Balance training;Neuromuscular re-education;Patient/family education;Energy conservation   PT Next Visit Plan assess 6 min walk test, FGA and revise baselines.  Functional exercises for LE strength.  Walking program   Recommended Other Services Speech therapy   Consulted and Agree with Plan of Care Patient;Family member/caregiver   Family Member Consulted Mother      Patient will benefit from skilled therapeutic intervention in order to improve the following deficits and impairments:     Visit Diagnosis: Ataxic cerebral palsy (HCC)  Muscle weakness (generalized)  Ataxic gait  Unsteadiness on feet      G-Codes - 06/06/17 1100    Functional Assessment Tool Used (Outpatient Only) clinical assessment   Functional Limitation Mobility: Walking and moving around   Mobility: Walking and Moving Around Current Status (Z6109) At least 20 percent but less than 40 percent impaired, limited or restricted   Mobility: Walking and Moving Around Goal Status [phone]) At least 1 percent but less than 20 percent impaired, limited or restricted       Problem List Patient Active Problem List   Diagnosis Date Noted  . Asthma with status asthmaticus 07/13/2013  . Hypothyroidism 07/13/2013  . Cerebral palsy (HCC) 07/13/2013  . Schizo affective schizophrenia (HCC) 07/13/2013  . 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 Anxiety 07/13/2013  . Status asthmaticus 07/13/2013    Temple Pacini  Malvin Johns, PT, DPT 05/22/17    11:04  AM    [facility] [address] [place], [place], [address] Phone: [phone]   Fax:  [phone]  Name: Angela Nicholson MRN: [id number] Date of Birth: [birth year]

## 2017-06-10 ENCOUNTER — Ambulatory Visit: Payer: Medicare Other

## 2017-06-10 DIAGNOSIS — G804 Ataxic cerebral palsy: Secondary | ICD-10-CM | POA: Diagnosis not present

## 2017-06-10 DIAGNOSIS — M6281 Muscle weakness (generalized): Secondary | ICD-10-CM

## 2017-06-10 DIAGNOSIS — R2681 Unsteadiness on feet: Secondary | ICD-10-CM

## 2017-06-10 DIAGNOSIS — R260 Ataxic gait: Secondary | ICD-10-CM

## 2017-06-10 NOTE — Patient Instructions (Signed)
   Walking Program:  Begin walking for exercise for 6 minutes, 1-2 times/day, 5 days/week.   Progress your walking program by adding 1 minutes to your routine each week, as tolerated. Be sure to wear good walking shoes, walk in a safe environment and only progress to your tolerance.

## 2017-06-10 NOTE — Therapy (Signed)
[facility] [address] [place], [place], [address] Phone: [phone]   Fax:  [phone]  Physical Therapy Treatment  Patient Details  Name: Angela Nicholson MRN: [id number] Date of Birth: 19-Nov-1970 Referring Provider: Kip A. Corrington, MD [facility] Family Medicine   Encounter Date: 06/10/2017  PT End of Session - 06/10/17 1403    Visit Number  2    Number of Visits  4    Date for PT Re-Evaluation  07/20/17    Authorization Type  Medicaid; awaiting approval of first 3 visits  (will be adding Medicare soon)    Authorization - Visit Number  1    Authorization - Number of Visits  3    PT Start Time  1321 pt alte    PT Stop Time  1400    PT Time Calculation (min)  39 min    Equipment Utilized During Treatment  -- S for safety    Activity Tolerance  Patient tolerated treatment well    Behavior During Therapy  WFL for tasks assessed/performed       Past Medical History:  Diagnosis Date  . Asthma   . Barrett esophagus   . Deaf   . GERD (gastroesophageal reflux disease)   . Schizo affective schizophrenia (HCC)     History reviewed. No pertinent surgical history.  There were no vitals filed for this visit.  Subjective Assessment - 06/10/17 1325    Subjective  Pt signs she's having a lot of back/shoulder pain, mom thinks this is related to scoliosis and inactivity. Pt goes to day care once a day and she bakes twice a week.     Patient is accompained by:  Family member;Interpreter    Pertinent History  ataxic cerebral palsy, deaf, PEG tube, aspiration PNA, and asthma    Patient Stated Goals  To get stronger and to eat with her mouth again.    Currently in Pain?  Yes    Pain Score  6     Pain Location  Back    Pain Orientation  Upper    Pain Descriptors / Indicators  Throbbing;Sore    Pain Type  Chronic pain    Pain Radiating Towards  LUE    Pain Onset  More than a month ago    Pain Frequency   Constant    Aggravating Factors   bad positioning while sleeping and more active after being inactive    Pain Relieving Factors  heat         [facility] PT Assessment - 06/10/17 1338      6 Minute Walk- Baseline   6 Minute Walk- Baseline  yes    BP (mmHg)  108/66    HR (bpm)  74    02 Sat (%RA)  99 %    Modified Borg Scale for Dyspnea  0- Nothing at all    Perceived Rate of Exertion (Borg)  6-      6 Minute walk- Post Test   6 Minute Walk Post Test  yes    BP (mmHg)  109/72    HR (bpm)  78    02 Sat (%RA)  96 %    Modified Borg Scale for Dyspnea  0- Nothing at all    Perceived Rate of Exertion (Borg)  11- Fairly light      6 minute walk test results    Aerobic Endurance Distance Walked  915    Endurance additional comments  No rest  breaks required during test and no LOB noted.  Pt did require seated rest break after walk 2/2 fatigue.         Please see pt instructions for walking program details.                   Self Care: PT Education - 06/10/17 1402    Education provided  Yes    Education Details  Extensive education regarding posture and increased activity (after inactivity) can incr. pain along with hx of scoliosis (per pt's mom).  PT discussed the importance of postural awareness, strengthening, and flexibility exercises. PT also encouraged pt's mom to notify MD about back pain, if it increases. Pt's mom stated MD is aware and believes back pain is from the scoliosis. PT provided pt with walking program. PT explained 6MWT and results.     Person(s) Educated  Patient;Parent(s)    Methods  Explanation    Comprehension  Verbalized understanding          PT Long Term Goals - 06/10/17 1432      PT LONG TERM GOAL #1   Title  (Target for LTG is by 3rd visit)  Pt will perform HEP and walking program 3/7 days per week with supervision of mother    Baseline  currently does not have a walking program or HEP    Time  3    Period  Weeks    Status  New       PT LONG TERM GOAL #2   Title  Pt will improve muscle strength by 1/2 mm grade on MMT    Baseline  3/5 hip flexion, 3+/5 knee flexion, ankle DF, 4/5 knee extension    Time  3    Period  Weeks    Status  New      PT LONG TERM GOAL #3   Title  Pt will increase walking distance by 100' during 6 minute walk test    Baseline  915' on 06/10/17    Time  3    Period  Weeks    Status  New      PT LONG TERM GOAL #4   Title  Pt will improve FGA by 4 points     Baseline  not assessed to date    Time  3    Period  Weeks      PT LONG TERM GOAL #5   Title  Pt will negotiate 14 stairs with two rails with alternating sequence    Baseline  2 rails, step to sequence, 4 stairs    Time  3    Period  Weeks    Status  New      PT LONG TERM GOAL #6   Title  Pt will tolerate standing x 20 minutes while performing leisure activity (art/cooking/cross word)    Baseline  Less than 5 minutes    Time  3    Period  Weeks    Status  New      PT LONG TERM GOAL #7   Title  Pt will report going out into the community for outings to [employer], shopping, grocery store with mom 3/7 days/week    Baseline  pt has not been going to [employer], goes out 1-2x/week    Time  3    Period  Weeks    Status  New            Plan - 06/10/17 1404    Clinical  Impression Statement  Today's skilled session focused on 6MWT and establishing home walking program. Increase time required 2/2 allow for sign interpretation during session. Pt's walking distance of 915' is less than the norms (1765') for an older female adult in the age range 60-69y/o (closer group with norms to pt's age.  PT will assess FGA and establish balance and strengtheing HEP next session. Continue with POC.     Rehab Potential  Good    Clinical Impairments Affecting Rehab Potential  1 year history of PEG Tube use for nutrition due to aspiration PNA    PT Frequency  Other (comment) 3 initial visits + 12 through end of year    PT Duration  Other (comment)  1x/week x 3 weeks; 2x/week x 4 weeks    PT Treatment/Interventions  ADLs/Self Care Home Management;Gait training;Stair training;Functional mobility training;Therapeutic activities;Therapeutic exercise;Balance training;Neuromuscular re-education;Patient/family education;Energy conservation    PT Next Visit Plan  add more visits, once Medicare is verified. FGA and revise baselines.  Functional exercises for LE strength.  Walking program    Consulted and Agree with Plan of Care  Patient;Family member/caregiver    Family Member Consulted  Mother       Patient will benefit from skilled therapeutic intervention in order to improve the following deficits and impairments:     Visit Diagnosis: Muscle weakness (generalized)  Ataxic gait  Unsteadiness on feet     Problem List Patient Active Problem List   Diagnosis Date Noted  . Asthma with status asthmaticus 07/13/2013  . Hypothyroidism 07/13/2013  . Cerebral palsy (HCC) 07/13/2013  . Schizo affective schizophrenia (HCC) 07/13/2013  . 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 Anxiety 07/13/2013  . Status asthmaticus 07/13/2013    Trevin Gartrell L 06/10/2017, 2:34 PM   [facility] [address] [place], [place], [address] Phone: [phone]   Fax:  [phone]  Name: Susy ManorVeronica Grabinski MRN: [id number] Date of Birth: 09/28/1970  Zerita BoersJennifer Alica Shellhammer, PT,DPT 06/10/17 2:37 PM Phone: [phone] Fax: [phone]

## 2017-06-19 ENCOUNTER — Ambulatory Visit: Payer: Medicare Other | Admitting: Physical Therapy

## 2017-06-19 ENCOUNTER — Encounter: Payer: Self-pay | Admitting: Physical Therapy

## 2017-06-19 DIAGNOSIS — G804 Ataxic cerebral palsy: Secondary | ICD-10-CM | POA: Diagnosis not present

## 2017-06-19 DIAGNOSIS — M6281 Muscle weakness (generalized): Secondary | ICD-10-CM

## 2017-06-19 DIAGNOSIS — R2681 Unsteadiness on feet: Secondary | ICD-10-CM

## 2017-06-19 DIAGNOSIS — R260 Ataxic gait: Secondary | ICD-10-CM

## 2017-06-19 NOTE — Patient Instructions (Signed)
Side Stretch - IN SITTING    In SITTING PLACE A PILLOW BESIDE YOUR RIGHT SIDE: while bending directly to  RIGHT side OVER PILLOW - DOWN TO ELBOW Keeping LEFT ARM UP AND REACH OVER HEAD.  HOLD 4-5 SECONDS.  REPEAT 2-3 TIMES  Side Waist Stretch from Child's Pose    From child's pose, walk hands to left. Reach right hand out on diagonal. Reach hips back toward heels making a C with torso. Breathe into right side waist. Hold for ___2_ breaths. Repeat _2___ times each side.  Bridge    Lying on back, legs bent 90, feet flat on floor. Press up hips and torso, PRESSING ARMS DOWN IN THE FLOOR, SQUEEZE SHOULDER BLADES TOGETHER Hold for _2___ breaths.  REPEAT 10 TIMES

## 2017-06-19 NOTE — Therapy (Signed)
[facility] [address] [place], [place], [address] Phone: [phone]   Fax:  [phone]  Physical Therapy Treatment  Patient Details  Name: Angela Nicholson MRN: [id number] Date of Birth: 05/11/1971 Referring Provider: Kip A. Corrington, MD [facility]   Encounter Date: 06/19/2017  PT End of Session - 06/19/17 1504    Visit Number  3    Number of Visits  4    Date for PT Re-Evaluation  07/20/17    Authorization Type  Medicare now primary; visits added.  10th visit G code and PN     Authorization - Visit Number  2    Authorization - Number of Visits  3    PT Start Time  1405    PT Stop Time  1450    PT Time Calculation (min)  45 min    Equipment Utilized During Treatment  -- S for safety    Activity Tolerance  Patient tolerated treatment well    Behavior During Therapy  WFL for tasks assessed/performed       Past Medical History:  Diagnosis Date  . Asthma   . Barrett esophagus   . Deaf   . GERD (gastroesophageal reflux disease)   . Schizo affective schizophrenia (HCC)     History reviewed. No pertinent surgical history.  There were no vitals filed for this visit.  Subjective Assessment - 06/19/17 1408    Subjective  Pt still having L shoulder/back pain when ambulating longer distances - right over scapula.  Pt has been walking a lot outside and going to multiple store.  Helped bake for Thanksgiving.  Swallow study has not been scheduled yet.    Patient is accompained by:  Family member;Interpreter    Pertinent History  ataxic cerebral palsy, deaf, PEG tube, aspiration PNA, and asthma    Patient Stated Goals  To get stronger and to eat with her mouth again.    Currently in Pain?  Yes    Pain Score  6     Pain Location  Scapula    Pain Orientation  Left;Posterior    Pain Descriptors / Indicators  Sore    Pain Type  Acute pain    Pain Onset  More than a month ago          [facility] Assessment - 06/19/17 1413      Functional Gait  Assessment   Gait assessed   Yes    Gait Level Surface  Walks 20 ft in less than 7 sec but greater than 5.5 sec, uses assistive device, slower speed, mild gait deviations, or deviates 6-10 in outside of the 12 in walkway width.    Change in Gait Speed  Able to change speed, demonstrates mild gait deviations, deviates 6-10 in outside of the 12 in walkway width, or no gait deviations, unable to achieve a major change in velocity, or uses a change in velocity, or uses an assistive device.    Gait with Horizontal Head Turns  Performs head turns with moderate changes in gait velocity, slows down, deviates 10-15 in outside 12 in walkway width but recovers, can continue to walk.    Gait with Vertical Head Turns  Performs task with moderate change in gait velocity, slows down, deviates 10-15 in outside 12 in walkway width but recovers, can continue to walk.    Gait and Pivot Turn  Pivot turns safely in greater than 3 sec and stops with no loss of  balance, or pivot turns safely within 3 sec and stops with mild imbalance, requires small steps to catch balance.    Step Over Obstacle  Is able to step over one shoe box (4.5 in total height) without changing gait speed. No evidence of imbalance.    Gait with Narrow Base of Support  Ambulates 4-7 steps.    Gait with Eyes Closed  Walks 20 ft, slow speed, abnormal gait pattern, evidence for imbalance, deviates 10-15 in outside 12 in walkway width. Requires more than 9 sec to ambulate 20 ft.    Ambulating Backwards  Walks 20 ft, uses assistive device, slower speed, mild gait deviations, deviates 6-10 in outside 12 in walkway width.    Steps  Alternating feet, must use rail.    Total Score  16    FGA comment:  16/30                  [facility] Adult PT Treatment/Exercise - 06/19/17 1501      Neuro Re-ed    Neuro Re-ed Details   seated on physioball but unable to accurately perform pelvic  tilts; transitioned to rockerboard to perform anterior/posterior tilts and then lateral tilts without and then with reaching for trunk elongation and weight shifting.        Exercises   Exercises  Other Exercises    Other Exercises   Performed child's pose with reaching to L and then R to stretch lateral trunk, seated L lateral trunk stretch and supine bridges with UE presses to activate scapular retraction and extension             PT Education - 06/19/17 1504    Education provided  No    Education Details  HEP    Person(s) Educated  Patient;Parent(s)    Methods  Explanation;Demonstration;Handout    Comprehension  Verbalized understanding          PT Long Term Goals - 06/19/17 1509      PT LONG TERM GOAL #1   Title  Pt will perform HEP and walking program 3/7 days per week with supervision of mother    Baseline  currently does not have a walking program or HEP    Time  3    Period  Weeks    Status  New    Target Date  07/20/17      PT LONG TERM GOAL #2   Title  Pt will improve muscle strength by 1/2 mm grade on MMT    Baseline  3/5 hip flexion, 3+/5 knee flexion, ankle DF, 4/5 knee extension    Time  3    Period  Weeks    Status  New    Target Date  07/20/17      PT LONG TERM GOAL #3   Title  Pt will increase walking distance by 100' during 6 minute walk test    Baseline  915' on 06/10/17    Time  3    Period  Weeks    Status  New    Target Date  07/20/17      PT LONG TERM GOAL #4   Title  Pt will improve FGA by 4 points     Baseline  16/30    Time  3    Period  Weeks    Target Date  07/20/17      PT LONG TERM GOAL #5   Title  Pt will negotiate 14 stairs with two rails with alternating sequence  Baseline  2 rails, step to sequence, 4 stairs    Time  3    Period  Weeks    Status  New    Target Date  07/20/17      PT LONG TERM GOAL #6   Title  Pt will tolerate standing x 20 minutes while performing leisure activity (art/cooking/cross word)     Baseline  Less than 5 minutes    Time  3    Period  Weeks    Status  New    Target Date  07/20/17      PT LONG TERM GOAL #7   Title  Pt will report going out into the community for outings to [employer], shopping, grocery store with mom 3/7 days/week    Baseline  pt has not been going to [employer], goes out 1-2x/week    Time  3    Period  Weeks    Status  New    Target Date  07/20/17            Plan - 06/19/17 1505    Clinical Impression Statement  Treatment session today focused on continued assessment of gait and balance with FGA and initiation of core/trunk stretching, dissociation and strengthening exercises for clinic and for HEP.  Based on FGA pt is at increased risk for falls.  Will add more dynamic standing balance exercises to HEP next session.  Pt is gaining more confidence and is engaging in more home activities and community outings.    Rehab Potential  Good    Clinical Impairments Affecting Rehab Potential  1 year history of PEG Tube use for nutrition due to aspiration PNA    PT Frequency  Other (comment) 3 initial visits + 12 through end of year    PT Duration  Other (comment) 1x/week x 3 weeks; 2x/week x 4 weeks    PT Treatment/Interventions  ADLs/Self Care Home Management;Gait training;Stair training;Functional mobility training;Therapeutic activities;Therapeutic exercise;Balance training;Neuromuscular re-education;Patient/family education;Energy conservation    PT Next Visit Plan   Functional exercises for LE strength and balance based on FGA;  Walking program    Consulted and Agree with Plan of Care  Patient;Family member/caregiver    Family Member Consulted  Mother       Patient will benefit from skilled therapeutic intervention in order to improve the following deficits and impairments:     Visit Diagnosis: Muscle weakness (generalized)  Ataxic gait  Unsteadiness on feet  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 Asthma with status asthmaticus 07/13/2013  . Hypothyroidism 07/13/2013  . Cerebral palsy (HCC) 07/13/2013  . Schizo affective schizophrenia (HCC) 07/13/2013  . 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 Anxiety 07/13/2013  . Status asthmaticus 07/13/2013    Dierdre HighmanAudra F Arthuro Canelo, PT, DPT 06/19/17    3:20 PM    [facility] [address] [place], [place], [address] Phone: [phone]   Fax:  [phone]  Name: Angela Nicholson MRN: [id number] Date of Birth: 04/03/1971

## 2017-06-25 ENCOUNTER — Encounter: Payer: Self-pay | Admitting: Physical Therapy

## 2017-06-25 ENCOUNTER — Ambulatory Visit: Payer: Medicare Other | Attending: Family Medicine | Admitting: Physical Therapy

## 2017-06-25 DIAGNOSIS — G804 Ataxic cerebral palsy: Secondary | ICD-10-CM | POA: Diagnosis present

## 2017-06-25 DIAGNOSIS — R260 Ataxic gait: Secondary | ICD-10-CM | POA: Insufficient documentation

## 2017-06-25 DIAGNOSIS — M6281 Muscle weakness (generalized): Secondary | ICD-10-CM

## 2017-06-25 DIAGNOSIS — R2681 Unsteadiness on feet: Secondary | ICD-10-CM | POA: Diagnosis present

## 2017-06-25 NOTE — Therapy (Signed)
[facility] [address] [place], [place], [address] Phone: [phone]   Fax:  [phone]  Physical Therapy Treatment  Patient Details  Name: Angela Nicholson MRN: [id number] Date of Birth: 08/26/1970 Referring Provider: Kip A. Corrington, MD [facility]   Encounter Date: 06/25/2017  PT End of Session - 06/25/17 2053    Visit Number  4    Number of Visits  15 3 medicaid visits + 12 medicare    Date for PT Re-Evaluation  07/20/17    Authorization Type  Medicare now primary; visits added.  10th visit G code and PN     Authorization - Visit Number  3    Authorization - Number of Visits  3 medicaid visits; pt primary insurance now Medicare    PT Start Time  1315    PT Stop Time  1407    PT Time Calculation (min)  52 min    Equipment Utilized During Treatment  -- S for safety    Activity Tolerance  Patient tolerated treatment well    Behavior During Therapy  WFL for tasks assessed/performed       Past Medical History:  Diagnosis Date  . Asthma   . Barrett esophagus   . Deaf   . GERD (gastroesophageal reflux disease)   . Schizo affective schizophrenia (HCC)     History reviewed. No pertinent surgical history.  There were no vitals filed for this visit.  Subjective Assessment - 06/25/17 1325    Subjective  Has MBSS scheduled for early January.  Has been performing the exercises at home with mother's help, still having some pain in L posterior shoulder at scapula.      Patient is accompained by:  Family member;Interpreter    Pertinent History  ataxic cerebral palsy, deaf, PEG tube, aspiration PNA, and asthma    Patient Stated Goals  To get stronger and to eat with her mouth again.    Pain Score  6     Pain Location  Shoulder    Pain Orientation  Left;Posterior    Pain Descriptors / Indicators  Sore    Pain Type  Acute pain    Pain Onset  More than a month ago                       [facility]/Ex[facility] - 06/25/17 1327      Neuro Re-ed    Neuro Re-ed Details   NMR on physioball: focusing on postural control during pelvic tilts laterally, anterior/posterior.  Introduced reaching laterally for further trunk excursion but pt reporting significant increase in L shoulder pain; performed reaching only with RUE into flexion<>extension with concurrent trunk extension<>flexion with inhalation<>exhalation.  Continued NMR on ball with bouncing for LE activation and postural control, balance and core activation with alternating LAQ with therapist providing constant hands on facilitation at trunk and pelvis and assistance to stabilize feet and for extension through LE to maintain positioning on physioball.        Exercises   Other Exercises   Breathing exercise with resistance through narrow straw with cues for prolonged inhalation and exhalation; recommended pt perform 10 reps of breaths, 2x/day.               PT Education - 06/25/17 2052    Education provided  Yes    Education Details  respirations through straw (for resistance) 2x/day    Person(s) Educated  Patient  Methods  Explanation;Demonstration    Comprehension  Verbalized understanding;Returned demonstration          PT Long Term Goals - 06/19/17 1509      PT LONG TERM GOAL #1   Title  Pt will perform HEP and walking program 3/7 days per week with supervision of mother    Baseline  currently does not have a walking program or HEP    Time  3    Period  Weeks    Status  New    Target Date  07/20/17      PT LONG TERM GOAL #2   Title  Pt will improve muscle strength by 1/2 mm grade on MMT    Baseline  3/5 hip flexion, 3+/5 knee flexion, ankle DF, 4/5 knee extension    Time  3    Period  Weeks    Status  New    Target Date  07/20/17      PT LONG TERM GOAL #3   Title  Pt will increase walking distance by 100' during 6 minute walk test    Baseline  915' on  06/10/17    Time  3    Period  Weeks    Status  New    Target Date  07/20/17      PT LONG TERM GOAL #4   Title  Pt will improve FGA by 4 points     Baseline  16/30    Time  3    Period  Weeks    Target Date  07/20/17      PT LONG TERM GOAL #5   Title  Pt will negotiate 14 stairs with two rails with alternating sequence    Baseline  2 rails, step to sequence, 4 stairs    Time  3    Period  Weeks    Status  New    Target Date  07/20/17      PT LONG TERM GOAL #6   Title  Pt will tolerate standing x 20 minutes while performing leisure activity (art/cooking/cross word)    Baseline  Less than 5 minutes    Time  3    Period  Weeks    Status  New    Target Date  07/20/17      PT LONG TERM GOAL #7   Title  Pt will report going out into the community for outings to [employer], shopping, grocery store with mom 3/7 days/week    Baseline  pt has not been going to [employer], goes out 1-2x/week    Time  3    Period  Weeks    Status  New    Target Date  07/20/17            Plan - 06/25/17 2057    Clinical Impression Statement  Returned to trunk and postural control training on physioball with incorporation of reaching and breathing exercises to facilitate increased core activation and strengthening.  Pt unable to reach with LUE due to pain in shoulder; pt continues to report pain in L scapular region and also reports pain in L lateral neck that radiates down LUE at times.  She also reports some numbness in LUE at night.  Pt tends to sleep on L side and often falls asleep in her recliner with her head tilted to the L.  Will continue to assess ROM restrictions at L shoulder.  Provided pt with breathing/respiration exercises for home.  Will continue to progress  towards LTG.    Rehab Potential  Good    Clinical Impairments Affecting Rehab Potential  1 year history of PEG Tube use for nutrition due to aspiration PNA    PT Frequency  Other (comment) 3 initial visits + 12 through end of year     PT Duration  Other (comment) 1x/week x 3 weeks; 2x/week x 4 weeks    PT Treatment/Interventions  ADLs/Self Care Home Management;Gait training;Stair training;Functional mobility training;Therapeutic activities;Therapeutic exercise;Balance training;Neuromuscular re-education;Patient/family education;Energy conservation    PT Next Visit Plan  more in depth assessment of L shoulder and neck ROM restrictions and pain;  Functional exercises for LE strength and balance based on FGA;  Walking program    Consulted and Agree with Plan of Care  Patient;Family member/caregiver    Family Member Consulted  Mother       Patient will benefit from skilled therapeutic intervention in order to improve the following deficits and impairments:     Visit Diagnosis: Muscle weakness (generalized)  Ataxic gait  Ataxic cerebral palsy (HCC)  Unsteadiness on feet     Problem List Patient Active Problem List   Diagnosis Date Noted  . Asthma with status asthmaticus 07/13/2013  . Hypothyroidism 07/13/2013  . Cerebral palsy (HCC) 07/13/2013  . Schizo affective schizophrenia (HCC) 07/13/2013  . 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 Anxiety 07/13/2013  . Status asthmaticus 07/13/2013    Dierdre HighmanAudra F Valerie Fredin, PT, DPT 06/25/17    9:02 PM    [facility] [address] [place], [place], [address] Phone: [phone]   Fax:  [phone]  Name: Angela Nicholson MRN: [id number] Date of Birth: 04/30/1971

## 2017-07-01 ENCOUNTER — Ambulatory Visit: Payer: Medicare Other

## 2017-07-03 ENCOUNTER — Ambulatory Visit: Payer: Medicare Other

## 2017-07-06 ENCOUNTER — Ambulatory Visit: Payer: Medicare Other

## 2017-07-06 DIAGNOSIS — M6281 Muscle weakness (generalized): Secondary | ICD-10-CM

## 2017-07-06 DIAGNOSIS — R2681 Unsteadiness on feet: Secondary | ICD-10-CM

## 2017-07-06 NOTE — Therapy (Signed)
[facility] [address] [place], [place], [address] Phone: [phone]   Fax:  [phone]  Physical Therapy Treatment  Patient Details  Name: Angela Nicholson MRN: [id number] Date of Birth: 04/01/1971 Referring Provider: Kip A. Corrington, MD [facility]   Encounter Date: 07/06/2017  PT End of Session - 07/06/17 1023    Visit Number  5    Number of Visits  15    Date for PT Re-Evaluation  07/20/17    Authorization Type  Medicare now primary; visits added.  10th visit G code and PN     PT Start Time  0930    PT Stop Time  1012    PT Time Calculation (min)  42 min    Equipment Utilized During Treatment  -- S to ensure safety    Activity Tolerance  Patient tolerated treatment well    Behavior During Therapy  WFL for tasks assessed/performed       Past Medical History:  Diagnosis Date  . Asthma   . Barrett esophagus   . Deaf   . GERD (gastroesophageal reflux disease)   . Schizo affective schizophrenia (HCC)     History reviewed. No pertinent surgical history.  There were no vitals filed for this visit.  Subjective Assessment - 07/06/17 0933    Subjective  Pt went to the doctor on Friday and he said she doesn't have the flu or strep, and she's feeling better. Pt denied falls since last visit. Pt reported no pain in L shoulder or neck.     Patient is accompained by:  Family member;Interpreter    Pertinent History  ataxic cerebral palsy, deaf, PEG tube, aspiration PNA, and asthma    Patient Stated Goals  To get stronger and to eat with her mouth again.    Currently in Pain?  No/denies         Therex: Pt performed functional BLE strengthening HEP with cues and demo for technique. No c/o during session. Please see pt instructions for HEP details.                     PT Education - 07/06/17 1019    Education provided  Yes    Education Details  PT provided pt  with functional LE strengthening HEP.     Person(s) Educated  Patient;Parent(s) interpreter    Methods  Explanation;Demonstration;Tactile cues;Verbal cues;Handout    Comprehension  Returned demonstration;Verbalized understanding;Need further instruction          PT Long Term Goals - 06/19/17 1509      PT LONG TERM GOAL #1   Title  Pt will perform HEP and walking program 3/7 days per week with supervision of mother    Baseline  currently does not have a walking program or HEP    Time  3    Period  Weeks    Status  New    Target Date  07/20/17      PT LONG TERM GOAL #2   Title  Pt will improve muscle strength by 1/2 mm grade on MMT    Baseline  3/5 hip flexion, 3+/5 knee flexion, ankle DF, 4/5 knee extension    Time  3    Period  Weeks    Status  New    Target Date  07/20/17      PT LONG TERM GOAL #3   Title  Pt will increase walking distance by 100' during 6  minute walk test    Baseline  915' on 06/10/17    Time  3    Period  Weeks    Status  New    Target Date  07/20/17      PT LONG TERM GOAL #4   Title  Pt will improve FGA by 4 points     Baseline  16/30    Time  3    Period  Weeks    Target Date  07/20/17      PT LONG TERM GOAL #5   Title  Pt will negotiate 14 stairs with two rails with alternating sequence    Baseline  2 rails, step to sequence, 4 stairs    Time  3    Period  Weeks    Status  New    Target Date  07/20/17      PT LONG TERM GOAL #6   Title  Pt will tolerate standing x 20 minutes while performing leisure activity (art/cooking/cross word)    Baseline  Less than 5 minutes    Time  3    Period  Weeks    Status  New    Target Date  07/20/17      PT LONG TERM GOAL #7   Title  Pt will report going out into the community for outings to [employer], shopping, grocery store with mom 3/7 days/week    Baseline  pt has not been going to [employer], goes out 1-2x/week    Time  3    Period  Weeks    Status  New    Target Date  07/20/17             Plan - 07/06/17 1024    Clinical Impression Statement  Today's skilled session focused on functional BLE strength training, as pt reported cx/L shoulder pain has ceased. Pt required extensive cues and demo for technique. Pt also required frequent seated rest break 2/2 fatigue and to improve eccentric control Pt would continue to benefit from skilled PT to improve safety during functional mobility.     Rehab Potential  Good    Clinical Impairments Affecting Rehab Potential  1 year history of PEG Tube use for nutrition due to aspiration PNA    PT Frequency  Other (comment) 3 initial visits + 12 through end of year    PT Duration  Other (comment) 1x/week x 3 weeks; 2x/week x 4 weeks    PT Treatment/Interventions  ADLs/Self Care Home Management;Gait training;Stair training;Functional mobility training;Therapeutic activities;Therapeutic exercise;Balance training;Neuromuscular re-education;Patient/family education;Energy conservation    PT Next Visit Plan  Functional exercises balance based on FGA;  Walking program. As needed: more in depth assessment of L shoulder and neck ROM restrictions and pain; Reviewe strengthening HEP prn.     Consulted and Agree with Plan of Care  Patient;Family member/caregiver    Family Member Consulted  Mother       Patient will benefit from skilled therapeutic intervention in order to improve the following deficits and impairments:     Visit Diagnosis: Muscle weakness (generalized)  Unsteadiness on feet     Problem List Patient Active Problem List   Diagnosis Date Noted  . Asthma with status asthmaticus 07/13/2013  . Hypothyroidism 07/13/2013  . Cerebral palsy (HCC) 07/13/2013  . Schizo affective schizophrenia (HCC) 07/13/2013  . 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 Anxiety 07/13/2013  . Status asthmaticus  07/13/2013    Finnis Colee L 07/06/2017,  10:27 AM  [facility] [address] [place], [place], [address] Phone: [phone]   Fax:  [phone]  Name: Angela Nicholson MRN: [id number] Date of Birth: 04/10/1971  Zerita BoersJennifer Antonino Nienhuis, PT,DPT 07/06/17 10:28 AM Phone: [phone] Fax: [phone]

## 2017-07-06 NOTE — Patient Instructions (Signed)
Functional Quadriceps: Sit to Stand    Scoot forward so you are sitting on edge of chair, feet flat on floor. Stand upright, extending knees fully. Then SLOWLY sit back down (count to 7 while going to sit down) Repeat __10__ times per set. Do __3__ sets per session. Do __3__ sessions per week.  http://orth.exer.us/735   Copyright  VHI. All rights reserved.   Functional Quadriceps: Chair Squat    Keeping feet flat on floor, shoulder width apart, squat as low as is comfortable. Use support as necessary. Repeat _10___ times per set. Do __2__ sets per session. Do __3__ sessions per week.  http://orth.exer.us/737   Copyright  VHI. All rights reserved.

## 2017-07-08 ENCOUNTER — Ambulatory Visit: Payer: Medicare Other

## 2017-07-17 ENCOUNTER — Ambulatory Visit: Payer: Medicare Other

## 2017-07-17 DIAGNOSIS — R260 Ataxic gait: Secondary | ICD-10-CM

## 2017-07-17 DIAGNOSIS — M6281 Muscle weakness (generalized): Secondary | ICD-10-CM

## 2017-07-17 DIAGNOSIS — R2681 Unsteadiness on feet: Secondary | ICD-10-CM

## 2017-07-17 NOTE — Therapy (Signed)
[facility] [address] [place], [place], [address] Phone: [phone]   Fax:  [phone]  Physical Therapy Treatment  Patient Details  Name: Angela Nicholson MRN: [id number] Date of Birth: January 21, 1971 Referring Provider: Kip A. Corrington, [facility]   Encounter Date: 07/17/2017  PT End of Session - 07/17/17 1530    Visit Number  6    Number of Visits  15    Date for PT Re-Evaluation  07/20/17    Authorization Type  Medicare now primary; visits added.  10th visit G code and PN     PT Start Time  1448    PT Stop Time  1527    PT Time Calculation (min)  39 min    Equipment Utilized During Treatment  -- min A to S prn    Activity Tolerance  Patient tolerated treatment well    Behavior During Therapy  WFL for tasks assessed/performed       Past Medical History:  Diagnosis Date  . Asthma   . Barrett esophagus   . Deaf   . GERD (gastroesophageal reflux disease)   . Schizo affective schizophrenia ([place])     History reviewed. No pertinent surgical history.  There were no vitals filed for this visit.  Subjective Assessment - 07/17/17 1452    Subjective  Pt denied falls since last visit. Pt reported she had bronchitis and that's why she missed appt's.     Patient is accompained by:  Family member;Interpreter Rachyl: interpreter    Pertinent History  ataxic cerebral palsy, deaf, PEG tube, aspiration PNA, and asthma    Patient Stated Goals  To get stronger and to eat with her mouth again.    Currently in Pain?  No/denies         [facility] PT Assessment - 07/17/17 1455      Functional Gait  Assessment   Gait assessed   Yes    Gait Level Surface  Walks 20 ft in less than 5.5 sec, no assistive devices, good speed, no evidence for imbalance, normal gait pattern, deviates no more than 6 in outside of the 12 in walkway width.    Change in Gait Speed  Able to change speed, demonstrates  mild gait deviations, deviates 6-10 in outside of the 12 in walkway width, or no gait deviations, unable to achieve a major change in velocity, or uses a change in velocity, or uses an assistive device.    Gait with Horizontal Head Turns  Performs head turns with moderate changes in gait velocity, slows down, deviates 10-15 in outside 12 in walkway width but recovers, can continue to walk.    Gait with Vertical Head Turns  Performs task with severe disruption of gait (eg, staggers 15 in outside 12 in walkway width, loses balance, stops, reaches for wall).    Gait and Pivot Turn  Pivot turns safely within 3 sec and stops quickly with no loss of balance.    Step Over Obstacle  Is able to step over 2 stacked shoe boxes taped together (9 in total height) without changing gait speed. No evidence of imbalance.    Gait with Narrow Base of Support  Ambulates 4-7 steps.    Gait with Eyes Closed  Walks 20 ft, uses assistive device, slower speed, mild gait deviations, deviates 6-10 in outside 12 in walkway width. Ambulates 20 ft in less than 9 sec but greater than 7 sec.    Ambulating Backwards  Walks 20 ft, uses assistive device, slower speed, mild gait deviations, deviates 6-10 in outside 12 in walkway width.    Steps  Alternating feet, must use rail.    Total Score  19    FGA comment:  19/30: indicates pt is at moderate risk for falls.                   [facility] Adult PT Treatment/Exercise - 07/17/17 1529      High Level Balance   High Level Balance Activities  Head turns;Tandem walking    High Level Balance Comments  Performed at counter with 1 UE support and min guard to S: pt performed 4-6x7'/activity with cues and demo for technique. PT provided head turns as balance HEP, please see pt instructions for HEP details.  Pt also performed static tandem stance x30 sec/LE with UE support on counter.             PT Education - 07/17/17 1530    Education provided  Yes    Education Details  PT  provided pt with counter balance HEP.    Person(s) Educated  Patient;Spouse    Methods  Explanation;Demonstration;Verbal cues;Handout    Comprehension  Verbalized understanding;Returned demonstration;Need further instruction          PT Long Term Goals - 06/19/17 1509      PT LONG TERM GOAL #1   Title  Pt will perform HEP and walking program 3/7 days per week with supervision of mother    Baseline  currently does not have a walking program or HEP    Time  3    Period  Weeks    Status  New    Target Date  07/20/17      PT LONG TERM GOAL #2   Title  Pt will improve muscle strength by 1/2 mm grade on MMT    Baseline  3/5 hip flexion, 3+/5 knee flexion, ankle DF, 4/5 knee extension    Time  3    Period  Weeks    Status  New    Target Date  07/20/17      PT LONG TERM GOAL #3   Title  Pt will increase walking distance by 100' during 6 minute walk test    Baseline  915' on 06/10/17    Time  3    Period  Weeks    Status  New    Target Date  07/20/17      PT LONG TERM GOAL #4   Title  Pt will improve FGA by 4 points     Baseline  16/30    Time  3    Period  Weeks    Target Date  07/20/17      PT LONG TERM GOAL #5   Title  Pt will negotiate 14 stairs with two rails with alternating sequence    Baseline  2 rails, step to sequence, 4 stairs    Time  3    Period  Weeks    Status  New    Target Date  07/20/17      PT LONG TERM GOAL #6   Title  Pt will tolerate standing x 20 minutes while performing leisure activity (art/cooking/cross word)    Baseline  Less than 5 minutes    Time  3    Period  Weeks    Status  New    Target Date  07/20/17      PT LONG TERM  GOAL #7   Title  Pt will report going out into the community for outings to [employer], shopping, grocery store with mom 3/7 days/week    Baseline  pt has not been going to [employer], goes out 1-2x/week    Time  3    Period  Weeks    Status  New    Target Date  07/20/17            Plan - 07/17/17 1531     Clinical Impression Statement  Pt's FGA score improved to 19/30, indicating pt is at moderate risk for falls. However, pt only partially met FGA LTG. PT provided pt with counter balance activity to improve vestibular input. Incr. time during all activities required to allow sign interpreter explain activities to pt. Pt missed several appt's 2/2 illness, therefore, PT will assess remaining LTGs next week and likely renew to continue to improve pt's safety during functional mobility. . Continue with POC.     Rehab Potential  Good    Clinical Impairments Affecting Rehab Potential  1 year history of PEG Tube use for nutrition due to aspiration PNA    PT Frequency  Other (comment) 3 initial visits + 12 through end of year    PT Duration  Other (comment) 1x/week x 3 weeks; 2x/week x 4 weeks    PT Treatment/Interventions  ADLs/Self Care Home Management;Gait training;Stair training;Functional mobility training;Therapeutic activities;Therapeutic exercise;Balance training;Neuromuscular re-education;Patient/family education;Energy conservation    PT Next Visit Plan  Assess remaining LTGs and likely renew.     Consulted and Agree with Plan of Care  Patient;Family member/caregiver    Family Member Consulted  Mother       Patient will benefit from skilled therapeutic intervention in order to improve the following deficits and impairments:     Visit Diagnosis: Unsteadiness on feet  Muscle weakness (generalized)  Ataxic gait     Problem List Patient Active Problem List   Diagnosis Date Noted  . Asthma with status asthmaticus 07/13/2013  . Hypothyroidism 07/13/2013  . Cerebral palsy ([place]) 07/13/2013  . Schizo affective schizophrenia ([place]) 07/13/2013  . 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 Anxiety 07/13/2013  . Status asthmaticus 07/13/2013    Miller,Jennifer L 07/17/2017, 3:34 PM  [facility] [address] [place], [place], [address] Phone: [phone]   Fax:  [phone]  Name: Brisia Schuermann MRN: [id number] Date of Birth: Jan 12, 1971  Geoffry Paradise, PT,DPT 07/17/17 3:34 PM Phone: [phone] Fax: [phone]

## 2017-07-17 NOTE — Patient Instructions (Addendum)
Up / Down Head Motion    Perform at counter with hand support. Walking on solid surface, move head and eyes toward ceiling for _2___ steps. Then, move head and eyes toward floor for __2__ steps. Repeat __4__ times per session. Do __1__ sessions per day.  Copyright  VHI. All rights reserved.  Side to Side Head Motion    Perform at counter with hand support. Walking on solid surface, turn head and eyes to left for __2__ steps. Then, turn head and eyes to opposite side for __2__ steps. Repeat sequence __4__ times per session. Do __1__ sessions per day.   Copyright  VHI. All rights reserved.

## 2017-07-23 ENCOUNTER — Encounter: Payer: Self-pay | Admitting: Rehabilitative and Restorative Service Providers"

## 2017-07-23 ENCOUNTER — Ambulatory Visit: Payer: Medicare Other | Attending: Family Medicine | Admitting: Rehabilitative and Restorative Service Providers"

## 2017-07-23 DIAGNOSIS — M6281 Muscle weakness (generalized): Secondary | ICD-10-CM | POA: Insufficient documentation

## 2017-07-23 DIAGNOSIS — G804 Ataxic cerebral palsy: Secondary | ICD-10-CM | POA: Insufficient documentation

## 2017-07-23 DIAGNOSIS — R2681 Unsteadiness on feet: Secondary | ICD-10-CM | POA: Diagnosis not present

## 2017-07-23 DIAGNOSIS — R260 Ataxic gait: Secondary | ICD-10-CM | POA: Diagnosis present

## 2017-07-23 DIAGNOSIS — R1312 Dysphagia, oropharyngeal phase: Secondary | ICD-10-CM | POA: Diagnosis present

## 2017-07-23 NOTE — Therapy (Signed)
[facility] [address] [place], [place], [address] Phone: [phone]   Fax:  [phone]  Physical Therapy Treatment and Goal Update  Patient Details  Name: Angela Nicholson MRN: [id number] Date of Birth: 05-17-1971 Referring Provider: Kip A. Corrington, [facility]   Encounter Date: 07/23/2017  PT End of Session - 07/23/17 2117    Visit Number  7    Number of Visits  15    Date for PT Re-Evaluation  07/20/17    Authorization Type  Medicare now primary; visits added.  10th visit G code and PN     PT Start Time  1320    PT Stop Time  1405    PT Time Calculation (min)  45 min    Activity Tolerance  Patient tolerated treatment well    Behavior During Therapy  WFL for tasks assessed/performed       Past Medical History:  Diagnosis Date  . Asthma   . Barrett esophagus   . Deaf   . GERD (gastroesophageal reflux disease)   . Schizo affective schizophrenia ([place])     History reviewed. No pertinent surgical history.  There were no vitals filed for this visit.  Subjective Assessment - 07/23/17 1325    Subjective  The patient's mother notes they took a break from some of the exercises due to bronchitis, but she should be able to return to doing HEP more regularly again.    Patient is accompained by:  Family member;Interpreter    Pertinent History  ataxic cerebral palsy, deaf, PEG tube, aspiration PNA, and asthma    Limitations  Walking;Other (comment)    Patient Stated Goals  To get stronger and to eat with her mouth again.    Currently in Pain?  No/denies         [facility] PT Assessment - 07/23/17 1327      6 Minute Walk- Baseline   6 Minute Walk- Baseline  --      6 minute walk test results    Aerobic Endurance Distance Walked  1000    Endurance additional comments  No rest breaks required during 6 minute walk, however patient inquired about time left and noted fatigue at end of  testing.                  [facility] Adult PT Treatment/Exercise - 07/23/17 2118      Ambulation/Gait   Ambulation/Gait  Yes    Ambulation/Gait Assistance  6: Modified independent (Device/Increase time)    Ambulation Distance (Feet)  1000 Feet    Assistive device  None    Stairs  Yes    Stairs Assistance  6: Modified independent (Device/Increase time)    Stair Management Technique  Two rails;Alternating pattern    Number of Stairs  16    Gait Comments  Patient has bilateral trendelenberg gait with hip weakness leading to narrowing base of support with gait.        Neuro Re-ed    Neuro Re-ed Details   Standing balance/postural training with feet in stride position performing ant/post rocking and then adding UEs for "rock and reach" for core engagement/ postural training/ balance training.  Sidestepping activities with CGA for safety and verbal cues (with interpreter signing) for foot position and core engagement.        Exercises   Exercises  Other Exercises    Other Exercises   Seated hamstring stretch R and L x 20 seconds  x 1 rep, Seated lateral trunk lean right and left with contralateral UE reaching overhead with tactile cues for stretch.  Seated shoulder rolls.  Standing postural cues with tactile input for scapular retraction/depression and upright head position.       Gait:  Performed gait x 230 feet x 2 with tactile cues for posture and arm swing with gait activities.  Encouraged upright head position during gait activities.            PT Long Term Goals - 07/23/17 1336      PT LONG TERM GOAL #1   Title  Pt will perform HEP and walking program 3/7 days per week with supervision of mother    Baseline  Intermittently performing due to recent illness and weather.      Time  3    Period  Weeks    Status  Partially Met      PT LONG TERM GOAL #2   Title  Pt will improve muscle strength by 1/2 mm grade on MMT    Baseline  3/5 hip flexion, 3+/5 knee flexion, ankle  DF, 4/5 knee extension    Time  3    Period  Weeks    Status  On-going      PT LONG TERM GOAL #3   Title  Pt will increase walking distance by 100' during 6 minute walk test    Baseline  915' on 06/10/17; improved up to 1000 ft in 6 minutes on 07/23/2017    Time  3    Period  Weeks    Status  Partially Met      PT LONG TERM GOAL #4   Title  Pt will improve FGA by 4 points     Baseline  16/30; scored 19/30 on 07/17/17    Time  3    Period  Weeks    Status  Partially Met      PT LONG TERM GOAL #5   Title  Pt will negotiate 14 stairs with two rails with alternating sequence    Baseline  The patient is now performing alternating steps in clinic x 14 steps.  The patient's mother reports that she has carpet on steps and patient continues to do step to pattern at home.    Time  3    Period  Weeks    Status  Achieved      PT LONG TERM GOAL #6   Title  Pt will tolerate standing x 20 minutes while performing leisure activity (art/cooking/cross word)    Baseline  Less than 5 minutes at evaluation;  The patient's mother subjectively reports that she has been baking and does reading recipe/ getting ingredients, mixing ingredients before resting.      Time  3    Period  Weeks    Status  Achieved      PT LONG TERM GOAL #7   Title  Pt will report going out into the community for outings to [employer], shopping, grocery store with mom 3/7 days/week    Baseline  pt has not been going to [employer], goes out 1-2x/week.  On 07/23/17:  patient gets out 3 days/week, depending on mom's work schedule.      Time  3    Period  Weeks    Status  Achieved       UPDATED LONG TERM GOALS: PT Long Term Goals - 07/23/17 2128      PT LONG TERM GOAL #1   Title  Pt will perform HEP and walking program 3/7 days per week with supervision of mother    Baseline  Intermittently performing due to recent illness and weather.      Time  4    Period  Weeks    Status  Revised    Target Date  08/22/17      PT LONG  TERM GOAL #2   Title  Pt will improve muscle strength by 1/2 mm grade on MMT    Baseline  3/5 hip flexion, 3+/5 knee flexion, ankle DF, 4/5 knee extension    Time  4    Period  Weeks    Status  Revised    Target Date  08/22/17      PT LONG TERM GOAL #3   Title  The patient will ambulate nonstop x 10 minutes in order to improve community ambulation.     Baseline  Fatigued after 6 minutes of ambulation.    Time  4    Period  Weeks    Status  New    Target Date  08/22/17      PT LONG TERM GOAL #4   Title  Improve FGA from 19/30 up to 22/30.    Time  4    Period  Weeks    Status  Revised    Target Date  08/22/17      PT LONG TERM GOAL #5   Title  The patient will negotiate community surfaces (parking lots, curb, grass) without UE support x 500 ft for improved community negotiation.    Baseline  Patient's mother notes patient holds onto her outdoors.    Time  4    Period  Weeks    Status  New    Target Date  08/22/17            Plan - 07/23/17 2124    Clinical Impression Statement  The patient met 3 LTGs, and partially met 3 LTGs.  Still to further test MMT for remaining LTG.  The patient has only been seen 7 of the 15 visits initially requested in plan of care due to recent illness.  PT updated LTGs and plan to continue working on mobility and optimize patient's functional status.      Rehab Potential  Good    Clinical Impairments Affecting Rehab Potential  1 year history of PEG Tube use for nutrition due to aspiration PNA    PT Frequency  2x / week    PT Duration  4 weeks    PT Treatment/Interventions  ADLs/Self Care Home Management;Gait training;Stair training;Functional mobility training;Therapeutic activities;Therapeutic exercise;Balance training;Neuromuscular re-education;Patient/family education;Energy conservation    PT Next Visit Plan  Check MMT LTG; continue working towards updated LTGs    Consulted and Agree with Plan of Care  Patient;Family member/caregiver     Family Member Consulted  Mother       Patient will benefit from skilled therapeutic intervention in order to improve the following deficits and impairments:  Impaired flexibility, Decreased balance, Decreased activity tolerance, Decreased endurance, Decreased strength, Postural dysfunction, Abnormal gait  Visit Diagnosis: Unsteadiness on feet  Muscle weakness (generalized)  Ataxic gait     Problem List Patient Active Problem List   Diagnosis Date Noted  . Asthma with status asthmaticus 07/13/2013  . Hypothyroidism 07/13/2013  . Cerebral palsy ([place]) 07/13/2013  . Schizo affective schizophrenia ([place]) 07/13/2013  . 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 Deafmutism  07/13/2013  . Anxiety 07/13/2013  . Status asthmaticus 07/13/2013    Lisette Mancebo , PT 07/23/2017, 9:26 PM  [facility] [address] [place], [place], [address] Phone: [phone]   Fax:  [phone]  Name: Angela Nicholson MRN: [id number] Date of Birth: 06-04-1971

## 2017-07-24 ENCOUNTER — Ambulatory Visit: Payer: Medicare Other | Admitting: Physical Therapy

## 2017-07-24 DIAGNOSIS — R2681 Unsteadiness on feet: Secondary | ICD-10-CM

## 2017-07-24 DIAGNOSIS — R260 Ataxic gait: Secondary | ICD-10-CM

## 2017-07-24 DIAGNOSIS — G804 Ataxic cerebral palsy: Secondary | ICD-10-CM

## 2017-07-24 DIAGNOSIS — M6281 Muscle weakness (generalized): Secondary | ICD-10-CM

## 2017-07-24 NOTE — Therapy (Signed)
[facility] [address] [place], [place], [address] Phone: [phone]   Fax:  [phone]  Physical Therapy Treatment  Patient Details  Name: Angela Nicholson MRN: [id number] Date of Birth: 1970/09/01 Referring Provider: Kip A. Corrington, MD [facility]   Encounter Date: 07/24/2017  PT End of Session - 07/24/17 1249    Visit Number  8    Number of Visits  15    Date for PT Re-Evaluation  08/22/17    Authorization Type  Medicare now primary; visits added.      PT Start Time  1148    PT Stop Time  1230    PT Time Calculation (min)  42 min    Activity Tolerance  Patient tolerated treatment well    Behavior During Therapy  WFL for tasks assessed/performed       Past Medical History:  Diagnosis Date  . Asthma   . Barrett esophagus   . Deaf   . GERD (gastroesophageal reflux disease)   . Schizo affective schizophrenia (HCC)     No past surgical history on file.  There were no vitals filed for this visit.  Subjective Assessment - 07/24/17 1157    Subjective  Pt reports having difficulty with 2 exercises at home; went for swallow study but they were getting ready to do a full barium study so had to reschedule for modified.      Patient is accompained by:  Family member;Interpreter    Pertinent History  ataxic cerebral palsy, deaf, PEG tube, aspiration PNA, and asthma    Limitations  Walking;Other (comment)    Patient Stated Goals  To get stronger and to eat with her mouth again.    Currently in Pain?  No/denies         [facility] PT Assessment - 07/24/17 1200      ROM / Strength   AROM / PROM / Strength  Strength      Strength   Overall Strength  Deficits    Overall Strength Comments  3+/5 hip flexion, 4/5 knee flexion and extension, ankle DF                  [facility] Adult PT Treatment/Exercise - 07/24/17 1211      Exercises   Exercises  Knee/Hip      Knee/Hip  Exercises: Seated   Sit to Sand  1 set;10 reps;without UE support counting to 7 for eccentric control when sitting          Balance Exercises - 07/24/17 1246      Balance Exercises: Standing   Tandem Stance  Eyes open;Upper extremity support 1;4 reps;Foam/compliant surface with head turns, head nods x 10 each     Gait with Head Turns  Forward;Upper extremity support;Foam/compliant surface;4 reps solid and compliant surfaces    Tandem Gait  Forward;Upper extremity support;Foam/compliant surface;4 reps solid surface and compliant, eyes ahead        PT Education - 07/24/17 1247    Education provided  Yes    Education Details  re-tested MMT; clarified and adjusted HEP    Person(s) Educated  Patient;Parent(s)    Methods  Explanation;Demonstration    Comprehension  Verbalized understanding;Returned demonstration          PT Long Term Goals - 07/24/17 1251      PT LONG TERM GOAL #1   Title  Pt will perform HEP and walking program 3/7 days per week with supervision of  mother    Baseline  Intermittently performing due to recent illness and weather.      Time  4    Period  Weeks    Status  Revised    Target Date  08/22/17      PT LONG TERM GOAL #2   Title  Pt will improve muscle strength by 1/2 mm grade on MMT    Baseline  3/5 hip flexion, 3+/5 knee flexion, ankle DF, 4/5 knee extension; on 07/24/17 improved to 3+/5 hip flexion, 4/5 knee flexion and ankle DF, knee extension remains 4/5     Time  4    Period  Weeks    Status  Achieved      PT LONG TERM GOAL #3   Title  The patient will ambulate nonstop x 10 minutes in order to improve community ambulation.     Baseline  Fatigued after 6 minutes of ambulation.    Time  4    Period  Weeks    Status  New    Target Date  08/22/17      PT LONG TERM GOAL #4   Title  Improve FGA from 19/30 up to 22/30.    Baseline  19/30 on 07/17/17    Time  4    Period  Weeks    Status  Revised    Target Date  08/22/17      PT LONG TERM  GOAL #5   Title  The patient will negotiate community surfaces (parking lots, curb, grass) without UE support x 500 ft for improved community negotiation.    Baseline  Patient's mother notes patient holds onto her outdoors.    Time  4    Period  Weeks    Status  New    Target Date  08/22/17            Plan - 07/24/17 1251    Clinical Impression Statement  Completed assessment of LTG with MMT of bilat LE: most impaired mm groups: hip flexors, ankle DF and knee flexors all improved by 1/2 to full muscle grade since evaluation.  Rest of session spent reviewing and revising HEP: changed repeated sit <> stand to taller surface and use of verbal counting for increased eccentric control during descent and split counter exercise into two: regular walking with head turns, nods and then tandem walking with eyes straight ahead (pt was combining two exercises and having significant difficulty performing).  Progressed balance challenge by adding compliant surface and tandem stance with head turns.  Will continue to progress towards LTG.    Rehab Potential  Good    Clinical Impairments Affecting Rehab Potential  1 year history of PEG Tube use for nutrition due to aspiration PNA    PT Frequency  2x / week    PT Duration  4 weeks    PT Treatment/Interventions  ADLs/Self Care Home Management;Gait training;Stair training;Functional mobility training;Therapeutic activities;Therapeutic exercise;Balance training;Neuromuscular re-education;Patient/family education;Energy conservation    PT Next Visit Plan  combine step ups on curb with playing hang man on mirror, balance training on compliant surface (cone taps, balance beam), quadruped/tall kneeling doing puzzle or crossword    Consulted and Agree with Plan of Care  Patient;Family member/caregiver    Family Member Consulted  Mother       Patient will benefit from skilled therapeutic intervention in order to improve the following deficits and impairments:   Impaired flexibility, Decreased balance, Decreased activity tolerance, Decreased endurance, Decreased strength, Postural dysfunction, Abnormal  gait  Visit Diagnosis: Unsteadiness on feet  Muscle weakness (generalized)  Ataxic gait  Ataxic cerebral palsy [facility](HCC)     Problem List Patient Active Problem List   Diagnosis Date Noted  . Asthma with status asthmaticus 07/13/2013  . Hypothyroidism 07/13/2013  . Cerebral palsy (HCC) 07/13/2013  . Schizo affective schizophrenia (HCC) 07/13/2013  . 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 Anxiety 07/13/2013  . Status asthmaticus 07/13/2013   Dierdre HighmanAudra F Potter, PT, DPT 07/24/17    1:02 PM    [facility] [address] [place], [place], [address] Phone: [phone]   Fax:  [phone]  Name: Angela Nicholson MRN: [id number] Date of Birth: 11/01/1970

## 2017-07-29 ENCOUNTER — Encounter: Payer: Self-pay | Admitting: Physical Therapy

## 2017-07-29 ENCOUNTER — Ambulatory Visit: Payer: Medicare Other | Admitting: Physical Therapy

## 2017-07-29 DIAGNOSIS — G804 Ataxic cerebral palsy: Secondary | ICD-10-CM

## 2017-07-29 DIAGNOSIS — R2681 Unsteadiness on feet: Secondary | ICD-10-CM

## 2017-07-29 DIAGNOSIS — R260 Ataxic gait: Secondary | ICD-10-CM

## 2017-07-29 DIAGNOSIS — M6281 Muscle weakness (generalized): Secondary | ICD-10-CM

## 2017-07-29 NOTE — Therapy (Signed)
[facility] [address] [place], [place], [address] Phone: [phone]   Fax:  [phone]  Physical Therapy Treatment  Patient Details  Name: Angela Nicholson MRN: [id number] Date of Birth: 12/18/1970 Referring Provider: Kip A. Corrington, MD [facility]   Encounter Date: 07/29/2017  PT End of Session - 07/29/17 1251    Visit Number  9    Number of Visits  15    Date for PT Re-Evaluation  08/22/17    Authorization Type  Medicare now primary; visits added.      PT Start Time  1105    PT Stop Time  1150    PT Time Calculation (min)  45 min    Activity Tolerance  Patient tolerated treatment well    Behavior During Therapy  WFL for tasks assessed/performed       Past Medical History:  Diagnosis Date  . Asthma   . Barrett esophagus   . Deaf   . GERD (gastroesophageal reflux disease)   . Schizo affective schizophrenia (HCC)     History reviewed. No pertinent surgical history.  There were no vitals filed for this visit.  Subjective Assessment - 07/29/17 1107    Subjective  Has not performed the exercises since reviewing, has been busy with social security appointments.  Is walking up the stairs with alternating sequence.  Is walking outside further distances.    Patient is accompained by:  Family member;Interpreter    Pertinent History  ataxic cerebral palsy, deaf, PEG tube, aspiration PNA, and asthma    Limitations  Walking;Other (comment)    Patient Stated Goals  To get stronger and to eat with her mouth again.    Currently in Pain?  No/denies                      [facility] Adult PT Treatment/Exercise - 07/29/17 1134      Ambulation/Gait   Ramp  5: Supervision    Ramp Details (indicate cue type and reason)  up/down multiple times while playing hang man on mirror at top of platform    Curb  5: Supervision    Curb Details (indicate cue type and reason)  stepping  up/down to and from solid surface and then compliant surface multiple times while playing hang man at mirror on platform    Gait Comments  Also performed up/down ramp with compliant blue foam over top, forwards to ascend and descend, backwards to ascend and descend, L lateral to ascend/descend, R lateral to ascend/descend x 3 reps each with supervision-min A          Balance Exercises - 07/29/17 1136      Balance Exercises: Standing   Other Standing Exercises  Performed forwards, lateral stepping to R and L, braiding on compliant blue mat 2 reps each with alternating foot taps to curb forwards, lateral and across midline with supervision-min A             PT Long Term Goals - 07/24/17 1251      PT LONG TERM GOAL #1   Title  Pt will perform HEP and walking program 3/7 days per week with supervision of mother    Baseline  Intermittently performing due to recent illness and weather.      Time  4    Period  Weeks    Status  Revised    Target Date  08/22/17      PT LONG TERM  GOAL #2   Title  Pt will improve muscle strength by 1/2 mm grade on MMT    Baseline  3/5 hip flexion, 3+/5 knee flexion, ankle DF, 4/5 knee extension; on 07/24/17 improved to 3+/5 hip flexion, 4/5 knee flexion and ankle DF, knee extension remains 4/5     Time  4    Period  Weeks    Status  Achieved      PT LONG TERM GOAL #3   Title  The patient will ambulate nonstop x 10 minutes in order to improve community ambulation.     Baseline  Fatigued after 6 minutes of ambulation.    Time  4    Period  Weeks    Status  New    Target Date  08/22/17      PT LONG TERM GOAL #4   Title  Improve FGA from 19/30 up to 22/30.    Baseline  19/30 on 07/17/17    Time  4    Period  Weeks    Status  Revised    Target Date  08/22/17      PT LONG TERM GOAL #5   Title  The patient will negotiate community surfaces (parking lots, curb, grass) without UE support x 500 ft for improved community negotiation.    Baseline   Patient's mother notes patient holds onto her outdoors.    Time  4    Period  Weeks    Status  New    Target Date  08/22/17            Plan - 07/29/17 1251    Clinical Impression Statement  Continued to focus on dynamic gait and balance challenges on solid and compliant surfaces, inclines and curb combined with cognitive game to improve balance reactions and confidence on variety of surfaces in the community.  Pt tolerated well with minimal fatigue but required intermittent hands on assistance to maintain balance on ramp with compliant foam added.  Will continue to address and progress towards LTG.    Rehab Potential  Good    Clinical Impairments Affecting Rehab Potential  1 year history of PEG Tube use for nutrition due to aspiration PNA    PT Frequency  2x / week    PT Duration  4 weeks    PT Treatment/Interventions  ADLs/Self Care Home Management;Gait training;Stair training;Functional mobility training;Therapeutic activities;Therapeutic exercise;Balance training;Neuromuscular re-education;Patient/family education;Energy conservation    PT Next Visit Plan  Combine balance and LE/core strengthening activities with activities pt enjoys: cross word, hang man, puzzles, scrabble.  stability ball balance and core work.  balance training on compliant surface (cone taps, balance beam), quadruped/tall kneeling doing puzzle or crossword.  Next WED: scrabble!    Consulted and Agree with Plan of Care  Patient;Family member/caregiver    Family Member Consulted  Mother       Patient will benefit from skilled therapeutic intervention in order to improve the following deficits and impairments:  Impaired flexibility, Decreased balance, Decreased activity tolerance, Decreased endurance, Decreased strength, Postural dysfunction, Abnormal gait  Visit Diagnosis: Unsteadiness on feet  Muscle weakness (generalized)  Ataxic gait  Ataxic cerebral palsy [facility])     Problem List Patient Active Problem  List   Diagnosis Date Noted  . Asthma with status asthmaticus 07/13/2013  . Hypothyroidism 07/13/2013  . Cerebral palsy (HCC) 07/13/2013  . Schizo affective schizophrenia (HCC) 07/13/2013  . 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 Anxiety 07/13/2013  . Status asthmaticus 07/13/2013    Dierdre Highman, PT, DPT 07/29/17    12:58 PM    [facility] [address] [place], [place], [address] Phone: [phone]   Fax:  [phone]  Name: Angela Nicholson MRN: [id number] Date of Birth: February 28, 1971

## 2017-07-31 ENCOUNTER — Ambulatory Visit: Payer: Medicare Other | Admitting: Rehabilitative and Restorative Service Providers"

## 2017-07-31 DIAGNOSIS — R2681 Unsteadiness on feet: Secondary | ICD-10-CM

## 2017-07-31 DIAGNOSIS — M6281 Muscle weakness (generalized): Secondary | ICD-10-CM

## 2017-07-31 NOTE — Patient Instructions (Signed)
Neck Side-Bending   We are going to do this motion lying down.  Rest on your right side with a towel roll under the right side of your neck.  Relax for 2-3 minutes as tolerated.  Do 1 time/day or more as needed for neck stretching.

## 2017-07-31 NOTE — Therapy (Signed)
[facility]: Angela Nicholson [address] [place], [place], [address] Phone: [phone]   Fax:  [phone]  Physical Therapy Treatment  Patient Details  Name: Angela Nicholson MRN: [id number] Date of Birth: 1970/12/09 Referring Provider: Kip A. Corrington, MD [facility]   Encounter Date: 07/31/2017  PT End of Session - 07/31/17 1636    Visit Number  10    Number of Visits  15    Date for PT Re-Evaluation  08/22/17    Authorization Type  Medicare now primary; visits added.      PT Start Time  1450    PT Stop Time  1530    PT Time Calculation (min)  40 min    Activity Tolerance  Patient tolerated treatment well    Behavior During Therapy  WFL for tasks assessed/performed       Past Medical History:  Diagnosis Date  . Asthma   . Barrett esophagus   . Deaf   . GERD (gastroesophageal reflux disease)   . Schizo affective schizophrenia (HCC)     No past surgical history on file.  There were no vitals filed for this visit.  Subjective Assessment - 07/31/17 1453    Subjective  The patient reports that she is walking more in the stores.  Her mom notices she is letting go of her more often now in the community and walking more outdoors.  She is going up and down steps step over step.  She is doing HEP intermittently in home.      Pertinent History  ataxic cerebral palsy, deaf, PEG tube, aspiration PNA, and asthma    Patient Stated Goals  To get stronger and to eat with her mouth again.    Currently in Pain?  No/denies                      [facility] Adult PT Treatment/Exercise - 07/31/17 1637      Neuro Re-ed    Neuro Re-ed Details   Quadriped and tall kneeling activities playing connect four with reaching R and L for game pieces for trunk elongation.  Seated physioball trunk/postural strengthening performing connect four with cues on posture via tactile cues. Standing on rocker board working  on balance/postural alignment, then rolling a ball up/down wall for trunk/core elongation and activation with min A for safety.   Stepping up onto 4" surface and reaching overhead to work on trunk elongation during functional tasks.        Exercises   Exercises  Other Exercises    Other Exercises   Standing squats emphasizing technique and slower speed (keeping heels on the floor, sitting back wiht weight through heels), sidestepping for hip abduction strengthening 20 feet x 6 reps adding a ball raise overhead for core engagement.  Cues for slower speed needed.    Sidelying using gravity to perform sustained L cervical stretch by R sidebending over towel roll and holding x minutes (provided for HEP).              PT Education - 07/31/17 1636    Education provided  Yes    Education Details  neck side bending to the right to stretch left side.    Person(s) Educated  Patient;Parent(s)    Methods  Explanation;Demonstration;Handout    Comprehension  Verbalized understanding;Returned demonstration          PT Long Term Goals - 07/24/17 1251      PT LONG  TERM GOAL #1   Title  Pt will perform HEP and walking program 3/7 days per week with supervision of mother    Baseline  Intermittently performing due to recent illness and weather.      Time  4    Period  Weeks    Status  Revised    Target Date  08/22/17      PT LONG TERM GOAL #2   Title  Pt will improve muscle strength by 1/2 mm grade on MMT    Baseline  3/5 hip flexion, 3+/5 knee flexion, ankle DF, 4/5 knee extension; on 07/24/17 improved to 3+/5 hip flexion, 4/5 knee flexion and ankle DF, knee extension remains 4/5     Time  4    Period  Weeks    Status  Achieved      PT LONG TERM GOAL #3   Title  The patient will ambulate nonstop x 10 minutes in order to improve community ambulation.     Baseline  Fatigued after 6 minutes of ambulation.    Time  4    Period  Weeks    Status  New    Target Date  08/22/17      PT LONG TERM  GOAL #4   Title  Improve FGA from 19/30 up to 22/30.    Baseline  19/30 on 07/17/17    Time  4    Period  Weeks    Status  Revised    Target Date  08/22/17      PT LONG TERM GOAL #5   Title  The patient will negotiate community surfaces (parking lots, curb, grass) without UE support x 500 ft for improved community negotiation.    Baseline  Patient's mother notes patient holds onto her outdoors.    Time  4    Period  Weeks    Status  New    Target Date  08/22/17            Plan - 07/31/17 1640    Clinical Impression Statement  The patient is continuing to demonstrate progress with moiblity per family report of not holding onto mom's arm when in the community and longer distance walking in the community.  The patient fatigues with prolonged standing activity.  PT to continue to focus on gait, balance, postural stretching/strengthening, and general conditioning to optimize functional status.     PT Treatment/Interventions  ADLs/Self Care Home Management;Gait training;Stair training;Functional mobility training;Therapeutic activities;Therapeutic exercise;Balance training;Neuromuscular re-education;Patient/family education;Energy conservation    PT Next Visit Plan  Combine balance and LE/core strengthening activities with activities pt enjoys: cross word, hang man, puzzles, scrabble.  stability ball balance and core work.  balance training on compliant surface (cone taps, balance beam), quadruped/tall kneeling doing puzzle or crossword.  Next WED: scrabble!    Consulted and Agree with Plan of Care  Patient;Family member/caregiver    Family Member Consulted  Mother       Patient will benefit from skilled therapeutic intervention in order to improve the following deficits and impairments:  Impaired flexibility, Decreased balance, Decreased activity tolerance, Decreased endurance, Decreased strength, Postural dysfunction, Abnormal gait  Visit Diagnosis: Unsteadiness on feet  Muscle  weakness (generalized)     Problem List Patient Active Problem List   Diagnosis Date Noted  . Asthma with status asthmaticus 07/13/2013  . Hypothyroidism 07/13/2013  . Cerebral palsy (HCC) 07/13/2013  . Schizo affective schizophrenia (HCC) 07/13/2013  . Deafness or hearing loss of type classifiable to 389.0  with type classifiable to 389.1 07/13/2013  . 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 Anxiety 07/13/2013  . Status asthmaticus 07/13/2013    Kamarius Buckbee, PT 07/31/2017, 4:42 PM  [facility] [address] [place], [place], [address] Phone: [phone]   Fax:  [phone]  Name: Breslin Burklow MRN: [id number] Date of Birth: [birth year]

## 2017-08-05 ENCOUNTER — Ambulatory Visit: Payer: Medicare Other | Admitting: Physical Therapy

## 2017-08-05 ENCOUNTER — Encounter: Payer: Self-pay | Admitting: Physical Therapy

## 2017-08-05 DIAGNOSIS — G804 Ataxic cerebral palsy: Secondary | ICD-10-CM

## 2017-08-05 DIAGNOSIS — R260 Ataxic gait: Secondary | ICD-10-CM

## 2017-08-05 DIAGNOSIS — R2681 Unsteadiness on feet: Secondary | ICD-10-CM | POA: Diagnosis not present

## 2017-08-05 DIAGNOSIS — M6281 Muscle weakness (generalized): Secondary | ICD-10-CM

## 2017-08-05 NOTE — Therapy (Signed)
[facility] [address] [place], [place], [address] Phone: [phone]   Fax:  [phone]  Physical Therapy Treatment  Patient Details  Name: Angela Nicholson MRN: [id number] Date of Birth: 03-Dec-1970 Referring Provider: Kip A. Corrington, MD [facility]   Encounter Date: 08/05/2017  PT End of Session - 08/05/17 1601    Visit Number  11    Number of Visits  15    Date for PT Re-Evaluation  08/22/17    Authorization Type  Medicare now primary; visits added.      PT Start Time  1315    PT Stop Time  1400    PT Time Calculation (min)  45 min    Activity Tolerance  Patient tolerated treatment well    Behavior During Therapy  WFL for tasks assessed/performed       Past Medical History:  Diagnosis Date  . Asthma   . Barrett esophagus   . Deaf   . GERD (gastroesophageal reflux disease)   . Schizo affective schizophrenia (HCC)     History reviewed. No pertinent surgical history.  There were no vitals filed for this visit.  Subjective Assessment - 08/05/17 1317    Subjective  Had one day where she was able to perform all of the exercises.  No issues with them.  Still working on stairs and neck stretch.  Pt excited to play Scrabble.    Pertinent History  ataxic cerebral palsy, deaf, PEG tube, aspiration PNA, and asthma    Patient Stated Goals  To get stronger and to eat with her mouth again.    Currently in Pain?  No/denies                      [facility] Adult PT Treatment/Exercise - 08/05/17 1320      Neuro Re-ed    Neuro Re-ed Details   Performed tall kneeling on mat x 30 minutes while playing Scrabble on bench in front of pt with pt requiring intermittent rest break in side sitting or leaning on bench.        Shoulder Exercises: Stretch   Corner Stretch  2 reps;30 seconds    Corner Stretch Limitations  in doorway, bilat UE and then single UE for increased chest  expansion          Balance Exercises - 08/05/17 1404      Balance Exercises: Standing   Other Standing Exercises  Standing first on solid surface, compliant surface and then in supported SLS on each LE while playing zoom ball opening UE up in various directions x 10 reps each condition.  Pt required support of therapist to maintain balance during SLS and standing on compliant pillow.  Pt presents with limited UE ROM into horizontal ABD and flexion.  Pt also fatigues quickly        PT Education - 08/05/17 1559    Education provided  Yes    Education Details  doorway chest stretch    Person(s) Educated  Patient;Parent(s)    Methods  Explanation;Demonstration    Comprehension  Verbalized understanding;Returned demonstration          PT Long Term Goals - 07/24/17 1251      PT LONG TERM GOAL #1   Title  Pt will perform HEP and walking program 3/7 days per week with supervision of mother    Baseline  Intermittently performing due to recent illness and weather.  Time  4    Period  Weeks    Status  Revised    Target Date  08/22/17      PT LONG TERM GOAL #2   Title  Pt will improve muscle strength by 1/2 mm grade on MMT    Baseline  3/5 hip flexion, 3+/5 knee flexion, ankle DF, 4/5 knee extension; on 07/24/17 improved to 3+/5 hip flexion, 4/5 knee flexion and ankle DF, knee extension remains 4/5     Time  4    Period  Weeks    Status  Achieved      PT LONG TERM GOAL #3   Title  The patient will ambulate nonstop x 10 minutes in order to improve community ambulation.     Baseline  Fatigued after 6 minutes of ambulation.    Time  4    Period  Weeks    Status  New    Target Date  08/22/17      PT LONG TERM GOAL #4   Title  Improve FGA from 19/30 up to 22/30.    Baseline  19/30 on 07/17/17    Time  4    Period  Weeks    Status  Revised    Target Date  08/22/17      PT LONG TERM GOAL #5   Title  The patient will negotiate community surfaces (parking lots, curb, grass)  without UE support x 500 ft for improved community negotiation.    Baseline  Patient's mother notes patient holds onto her outdoors.    Time  4    Period  Weeks    Status  New    Target Date  08/22/17            Plan - 08/05/17 1601    Clinical Impression Statement  Continued to focus on muscular endurance, postural control, proximal stability in prolonged tall kneeling position while performing cognitively challenging game (Scrabble) x 30 minutes.  Continued balance, UE strength, ROM and endurance training in standing with Zoom Ball and chest stretch in doorway.  Pt tolerated well but required multiple rest breaks during zoom ball.  Will continue to progress towards LTG.    Rehab Potential  Good    Clinical Impairments Affecting Rehab Potential  1 year history of PEG Tube use for nutrition due to aspiration PNA    PT Frequency  2x / week    PT Duration  4 weeks    PT Treatment/Interventions  ADLs/Self Care Home Management;Gait training;Stair training;Functional mobility training;Therapeutic activities;Therapeutic exercise;Balance training;Neuromuscular re-education;Patient/family education;Energy conservation    PT Next Visit Plan  Combine balance with zoom ball after stretching anterior chest mm (variety of stances, surfaces, tall kneeling, etc); gait outside over variety of surfaces, stability ball, balance on compliant surfaces (cone taps/balance beam), postural exercises    Consulted and Agree with Plan of Care  Patient;Family member/caregiver    Family Member Consulted  Mother       Patient will benefit from skilled therapeutic intervention in order to improve the following deficits and impairments:  Impaired flexibility, Decreased balance, Decreased activity tolerance, Decreased endurance, Decreased strength, Postural dysfunction, Abnormal gait  Visit Diagnosis: Unsteadiness on feet  Muscle weakness (generalized)  Ataxic gait  Ataxic cerebral palsy [facility](HCC)     Problem  List Patient Active Problem List   Diagnosis Date Noted  . Asthma with status asthmaticus 07/13/2013  . Hypothyroidism 07/13/2013  . Cerebral palsy (HCC) 07/13/2013  . Schizo affective schizophrenia (HCC) 07/13/2013  .  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 Anxiety 07/13/2013  . Status asthmaticus 07/13/2013    Dierdre Highman, PT, DPT 08/05/17    4:09 PM    [facility] [address] [place], [place], [address] Phone: [phone]   Fax:  [phone]  Name: Angela Nicholson MRN: [id number] Date of Birth: 01/20/1971

## 2017-08-07 ENCOUNTER — Ambulatory Visit: Payer: Medicare Other | Admitting: Physical Therapy

## 2017-08-07 ENCOUNTER — Telehealth: Payer: Self-pay | Admitting: Physical Therapy

## 2017-08-07 DIAGNOSIS — R260 Ataxic gait: Secondary | ICD-10-CM

## 2017-08-07 DIAGNOSIS — R2681 Unsteadiness on feet: Secondary | ICD-10-CM

## 2017-08-07 DIAGNOSIS — M6281 Muscle weakness (generalized): Secondary | ICD-10-CM

## 2017-08-07 DIAGNOSIS — G804 Ataxic cerebral palsy: Secondary | ICD-10-CM

## 2017-08-07 NOTE — Therapy (Signed)
[facility] [address] [place], [place], [address] Phone: [phone]   Fax:  [phone]  Physical Therapy Treatment  Patient Details  Name: Angela Nicholson MRN: [id number] Date of Birth: Dec 01, 1970 Referring Provider: Kip A. Corrington, MD [facility] Family Medicine   Encounter Date: 08/07/2017  PT End of Session - 08/07/17 1650    Visit Number  12    Number of Visits  15    Date for PT Re-Evaluation  08/22/17    Authorization Type  Medicare now primary; visits added.      PT Start Time  1455 arrived late    PT Stop Time  1530    PT Time Calculation (min)  35 min    Activity Tolerance  Patient limited by fatigue    Behavior During Therapy  [facility] for tasks assessed/performed       Past Medical History:  Diagnosis Date  . Asthma   . Barrett esophagus   . Deaf   . GERD (gastroesophageal reflux disease)   . Schizo affective schizophrenia (HCC)     No past surgical history on file.  There were no vitals filed for this visit.  Subjective Assessment - 08/07/17 1645    Subjective  Had MBSS yesterday and it was recommended that she participate in outpatient SLP.  Pt fatigued today.    Pertinent History  ataxic cerebral palsy, deaf, PEG tube, aspiration PNA, and asthma    Patient Stated Goals  To get stronger and to eat with her mouth again.    Currently in Pain?  No/denies                      [facility] Adult PT Treatment/Exercise - 08/07/17 1521      High Level Balance   High Level Balance Activities  Side stepping;Other (comment)    High Level Balance Comments  began on solid surface progressing to more compliant surface (red mats) forwards walking tapping cones to L and R, forwards walking tapping cones across midline and then side stepping to L and R performing forward taps to cones.  Required min-mod A as challenge progressed.      Shoulder Exercises: Stretch   Corner Stretch  3  reps;30 seconds    Corner Stretch Limitations  single UE and then bilat UE stretch to prepare for zoom ball          Balance Exercises - 08/07/17 1647      Balance Exercises: Standing   Other Standing Exercises  Performed zoom ball in unsupported sitting, without foot support on mat and then on compliant balance disc, standing with partial tandem R then L foot forwards, standing with feet apart on pillow and then briefly in tall kneeling with 10-20 reps UE/chest opening in various directions for UE strengthening, ROM, chest expansion, postural control, endurance and balance training with supervision until tall kneeling when pt required min-mod A to maintain balance.  Pt fatigued quickly             PT Long Term Goals - 07/24/17 1251      PT LONG TERM GOAL #1   Title  Pt will perform HEP and walking program 3/7 days per week with supervision of mother    Baseline  Intermittently performing due to recent illness and weather.      Time  4    Period  Weeks    Status  Revised    Target Date  08/22/17  PT LONG TERM GOAL #2   Title  Pt will improve muscle strength by 1/2 mm grade on MMT    Baseline  3/5 hip flexion, 3+/5 knee flexion, ankle DF, 4/5 knee extension; on 07/24/17 improved to 3+/5 hip flexion, 4/5 knee flexion and ankle DF, knee extension remains 4/5     Time  4    Period  Weeks    Status  Achieved      PT LONG TERM GOAL #3   Title  The patient will ambulate nonstop x 10 minutes in order to improve community ambulation.     Baseline  Fatigued after 6 minutes of ambulation.    Time  4    Period  Weeks    Status  New    Target Date  08/22/17      PT LONG TERM GOAL #4   Title  Improve FGA from 19/30 up to 22/30.    Baseline  19/30 on 07/17/17    Time  4    Period  Weeks    Status  Revised    Target Date  08/22/17      PT LONG TERM GOAL #5   Title  The patient will negotiate community surfaces (parking lots, curb, grass) without UE support x 500 ft for  improved community negotiation.    Baseline  Patient's mother notes patient holds onto her outdoors.    Time  4    Period  Weeks    Status  New    Target Date  08/22/17            Plan - 08/07/17 1650    Clinical Impression Statement  Treatment session continued to focus on dynamic sitting and standing balance on variety of surfaces, postural control and strengthening, and increasing activity tolerance and endurance.  Pt presented with increased fatigue today and some audible secretions during breathing; mother feels it is due to swallow study yesterday.  Advised mother to monitor pt for further symptoms of aspiration PNA.  Will continue to progress towards LTG.    Rehab Potential  Good    Clinical Impairments Affecting Rehab Potential  1 year history of PEG Tube use for nutrition due to aspiration PNA    PT Frequency  2x / week    PT Duration  4 weeks    PT Treatment/Interventions  ADLs/Self Care Home Management;Gait training;Stair training;Functional mobility training;Therapeutic activities;Therapeutic exercise;Balance training;Neuromuscular re-education;Patient/family education;Energy conservation    PT Next Visit Plan  Combine balance with zoom ball after stretching anterior chest mm (variety of stances, surfaces, tall kneeling, etc); gait outside over variety of surfaces, stability ball, balance on compliant surfaces (cone taps/balance beam), postural exercises    Consulted and Agree with Plan of Care  Patient;Family member/caregiver    Family Member Consulted  Mother       Patient will benefit from skilled therapeutic intervention in order to improve the following deficits and impairments:  Impaired flexibility, Decreased balance, Decreased activity tolerance, Decreased endurance, Decreased strength, Postural dysfunction, Abnormal gait  Visit Diagnosis: Unsteadiness on feet  Muscle weakness (generalized)  Ataxic gait  Ataxic cerebral palsy [facility])     Problem  List Patient Active Problem List   Diagnosis Date Noted  . Asthma with status asthmaticus 07/13/2013  . Hypothyroidism 07/13/2013  . Cerebral palsy (HCC) 07/13/2013  . Schizo affective schizophrenia (HCC) 07/13/2013  . 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  Anxiety 07/13/2013  . Status asthmaticus 07/13/2013    Dierdre HighmanAudra F Lynetta Tomczak, PT, DPT 08/07/17    4:55 PM    [facility] [address] [place], [place], [address] Phone: [phone]   Fax:  [phone]  Name: Angela Nicholson MRN: [id number] Date of Birth: 01/14/1971

## 2017-08-10 ENCOUNTER — Ambulatory Visit: Payer: Medicare Other | Admitting: Physical Therapy

## 2017-08-10 ENCOUNTER — Encounter: Payer: Self-pay | Admitting: Physical Therapy

## 2017-08-10 DIAGNOSIS — M6281 Muscle weakness (generalized): Secondary | ICD-10-CM

## 2017-08-10 DIAGNOSIS — G804 Ataxic cerebral palsy: Secondary | ICD-10-CM

## 2017-08-10 DIAGNOSIS — R260 Ataxic gait: Secondary | ICD-10-CM

## 2017-08-10 DIAGNOSIS — R2681 Unsteadiness on feet: Secondary | ICD-10-CM | POA: Diagnosis not present

## 2017-08-10 NOTE — Therapy (Signed)
[facility] [address] [place], [place], [address] Phone: [phone]   Fax:  [phone]  Physical Therapy Treatment  Patient Details  Name: Angela Nicholson MRN: [id number] Date of Birth: [birth year] Referring Provider: Kip A. Corrington, MD [facility] Family Medicine   Encounter Date: 08/10/2017  PT End of Session - 08/10/17 1630    Visit Number  13    Number of Visits  15    Date for PT Re-Evaluation  08/22/17    Authorization Type  Medicare now primary; visits added.      PT Start Time  1531    PT Stop Time  1615    PT Time Calculation (min)  44 min    Activity Tolerance  Patient limited by fatigue    Behavior During Therapy  [facility] for tasks assessed/performed       Past Medical History:  Diagnosis Date  . Asthma   . Barrett esophagus   . Deaf   . GERD (gastroesophageal reflux disease)   . Schizo affective schizophrenia (HCC)     History reviewed. No pertinent surgical history.  There were no vitals filed for this visit.  Subjective Assessment - 08/10/17 1538    Subjective  Scheduled SLP evaluation.  Was very sore in her abdomen from exercises on Friday.  Had to take tylenol and use heating pad.  Better today.     Pertinent History  ataxic cerebral palsy, deaf, PEG tube, aspiration PNA, and asthma    Patient Stated Goals  To get stronger and to eat with her mouth again.    Currently in Pain?  No/denies                      [facility] Adult PT Treatment/Exercise - 08/10/17 1552      Ambulation/Gait   Stairs  Yes    Stairs Assistance  5: Supervision;6: Modified independent (Device/Increase time)    Stairs Assistance Details (indicate cue type and reason)  practice on deeper steps ascending and then descending with a L turn to simulate platform landing at home    Stair Management Technique  One rail Left;Alternating pattern;Forwards    Number of Stairs  8    Height of Stairs  8       Neuro Re-ed    Neuro Re-ed Details   Tall kneeling squats forwards, L and R while reaching forwards or across midline to fling scarves.  Performed full side sit <> tall kneeling to L and R performing scarf fling and side stepping in tall kneeling to L and R x 2 sets while keeping scarf in air      Knee/Hip Exercises: Standing   Lateral Step Up  Right;Left;1 set;10 reps;Hand Hold: 0;Step Height: 2"    Forward Step Up  Right;Left;10 reps;Hand Hold: 1;Hand Hold: 0;Step Height: 2";Step Height: 4"    Step Down  Right;Left;10 reps;Hand Hold: 1;Hand Hold: 0;Step Height: 2";Step Height: 4"             PT Education - 08/10/17 1630    Education provided  Yes    Education Details  stair negotiation training    Person(s) Educated  Patient;Parent(s)    Methods  Explanation;Demonstration    Comprehension  Need further instruction          PT Long Term Goals - 07/24/17 1251      PT LONG TERM GOAL #1   Title  Pt will perform HEP and walking program  3/7 days per week with supervision of mother    Baseline  Intermittently performing due to recent illness and weather.      Time  4    Period  Weeks    Status  Revised    Target Date  08/22/17      PT LONG TERM GOAL #2   Title  Pt will improve muscle strength by 1/2 mm grade on MMT    Baseline  3/5 hip flexion, 3+/5 knee flexion, ankle DF, 4/5 knee extension; on 07/24/17 improved to 3+/5 hip flexion, 4/5 knee flexion and ankle DF, knee extension remains 4/5     Time  4    Period  Weeks    Status  Achieved      PT LONG TERM GOAL #3   Title  The patient will ambulate nonstop x 10 minutes in order to improve community ambulation.     Baseline  Fatigued after 6 minutes of ambulation.    Time  4    Period  Weeks    Status  New    Target Date  08/22/17      PT LONG TERM GOAL #4   Title  Improve FGA from 19/30 up to 22/30.    Baseline  19/30 on 07/17/17    Time  4    Period  Weeks    Status  Revised    Target Date  08/22/17       PT LONG TERM GOAL #5   Title  The patient will negotiate community surfaces (parking lots, curb, grass) without UE support x 500 ft for improved community negotiation.    Baseline  Patient's mother notes patient holds onto her outdoors.    Time  4    Period  Weeks    Status  New    Target Date  08/22/17            Plan - 08/10/17 1630    Clinical Impression Statement  Continued to focus on proximal hip strengthening and trunk control with various activities in tall kneeling with improved tolerance today.  Also continued stair negotiation training as pt reports having continued difficulty with turning at platform landings and descending deeper steps.  Pt continues to have increased difficulty with eccentric control descending without UE support; performed forwards and lateral step ups with and then without UE support but had to decrease to 2" due to significant weakness and imbalance.  Will continue to address to progress towards LTG.    Rehab Potential  Good    Clinical Impairments Affecting Rehab Potential  1 year history of PEG Tube use for nutrition due to aspiration PNA    PT Frequency  2x / week    PT Duration  4 weeks    PT Treatment/Interventions  ADLs/Self Care Home Management;Gait training;Stair training;Functional mobility training;Therapeutic activities;Therapeutic exercise;Balance training;Neuromuscular re-education;Patient/family education;Energy conservation    PT Next Visit Plan  Descending stairs with increased eccentric control (step ups/downs on 2") Combine balance with zoom ball after stretching anterior chest mm (variety of stances, surfaces, tall kneeling, etc); gait outside over variety of surfaces, stability ball, balance on compliant surfaces (cone taps/balance beam), postural exercises    Consulted and Agree with Plan of Care  Patient;Family member/caregiver    Family Member Consulted  Mother       Patient will benefit from skilled therapeutic intervention in  order to improve the following deficits and impairments:  Impaired flexibility, Decreased balance, Decreased activity tolerance, Decreased endurance, Decreased  strength, Postural dysfunction, Abnormal gait  Visit Diagnosis: Unsteadiness on feet  Muscle weakness (generalized)  Ataxic gait  Ataxic cerebral palsy [facility](HCC)     Problem List Patient Active Problem List   Diagnosis Date Noted  . Asthma with status asthmaticus 07/13/2013  . Hypothyroidism 07/13/2013  . Cerebral palsy (HCC) 07/13/2013  . Schizo affective schizophrenia (HCC) 07/13/2013  . 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 Anxiety 07/13/2013  . Status asthmaticus 07/13/2013    Dierdre HighmanAudra F Shanine Kreiger, PT, DPT 08/10/17    4:40 PM    [facility] [address] [place], [place], [address] Phone: [phone]   Fax:  [phone]  Name: Susy ManorVeronica Colburn MRN: [id number] Date of Birth: 07/04/1971

## 2017-08-10 NOTE — Telephone Encounter (Signed)
Entered in error, attempting to contact PCP for new order for Speech therapy eval and treat  Dierdre HighmanAudra F Humberto Addo, PT, DPT 08/10/17    4:29 PM

## 2017-08-14 ENCOUNTER — Ambulatory Visit: Payer: Medicare Other | Admitting: Physical Therapy

## 2017-08-14 ENCOUNTER — Encounter: Payer: Self-pay | Admitting: Physical Therapy

## 2017-08-14 DIAGNOSIS — R260 Ataxic gait: Secondary | ICD-10-CM

## 2017-08-14 DIAGNOSIS — M6281 Muscle weakness (generalized): Secondary | ICD-10-CM

## 2017-08-14 DIAGNOSIS — R2681 Unsteadiness on feet: Secondary | ICD-10-CM

## 2017-08-14 DIAGNOSIS — G804 Ataxic cerebral palsy: Secondary | ICD-10-CM

## 2017-08-14 NOTE — Therapy (Signed)
[facility] [address] [place], [place], [address] Phone: [phone]   Fax:  [phone]  Physical Therapy Treatment  Patient Details  Name: Angela Nicholson MRN: [id number] Date of Birth: August 25, 1970 Referring Provider: Kip A. Corrington, MD [facility]   Encounter Date: 08/14/2017  PT End of Session - 08/14/17 1619    Visit Number  14    Number of Visits  15    Date for PT Re-Evaluation  08/22/17    Authorization Type  Medicare now primary; visits added.      PT Start Time  1535    PT Stop Time  1615    PT Time Calculation (min)  40 min    Activity Tolerance  Patient tolerated treatment well    Behavior During Therapy  WFL for tasks assessed/performed       Past Medical History:  Diagnosis Date  . Asthma   . Barrett esophagus   . Deaf   . GERD (gastroesophageal reflux disease)   . Schizo affective schizophrenia (HCC)     History reviewed. No pertinent surgical history.  There were no vitals filed for this visit.  Subjective Assessment - 08/14/17 1539    Subjective  Received order for SLP; SLP evaluation is Monday.  Would like to continue to work on the stairs today.      Pertinent History  ataxic cerebral palsy, deaf, PEG tube, aspiration PNA, and asthma    Patient Stated Goals  To get stronger and to eat with her mouth again.    Currently in Pain?  No/denies                      [facility] Adult PT Treatment/Exercise - 08/14/17 1547      Knee/Hip Exercises: Aerobic   Nustep  L4 x 10 minutes with bilat UE and LE for endurance and strengthening - recommended use of Nustep at [facility] to patient and mother.      Knee/Hip Exercises: Standing   Forward Step Up  Right;Left;2 sets;10 reps;Hand Hold: 0;Step Height: 2";Step Height: 4"    Step Down  Right;Left;2 sets;10 reps;Hand Hold: 0;Step Height: 2";Step Height: 4"    Other Standing Knee Exercises  Standing on 2" step  and then 4" step -SLS with contralateral LE performing tap downs to R, L, middle.  Requires increased assistance when standing on RLE due to increased weakness.          Balance Exercises - 08/14/17 1618      Balance Exercises: Standing   Balance Beam  blue foam beam; one foot on beam in staggered stance while playing zoom ball, R then L foot on             PT Long Term Goals - 07/24/17 1251      PT LONG TERM GOAL #1   Title  Pt will perform HEP and walking program 3/7 days per week with supervision of mother    Baseline  Intermittently performing due to recent illness and weather.      Time  4    Period  Weeks    Status  Revised    Target Date  08/22/17      PT LONG TERM GOAL #2   Title  Pt will improve muscle strength by 1/2 mm grade on MMT    Baseline  3/5 hip flexion, 3+/5 knee flexion, ankle DF, 4/5 knee extension; on 07/24/17 improved to 3+/5 hip flexion,  4/5 knee flexion and ankle DF, knee extension remains 4/5     Time  4    Period  Weeks    Status  Achieved      PT LONG TERM GOAL #3   Title  The patient will ambulate nonstop x 10 minutes in order to improve community ambulation.     Baseline  Fatigued after 6 minutes of ambulation.    Time  4    Period  Weeks    Status  New    Target Date  08/22/17      PT LONG TERM GOAL #4   Title  Improve FGA from 19/30 up to 22/30.    Baseline  19/30 on 07/17/17    Time  4    Period  Weeks    Status  Revised    Target Date  08/22/17      PT LONG TERM GOAL #5   Title  The patient will negotiate community surfaces (parking lots, curb, grass) without UE support x 500 ft for improved community negotiation.    Baseline  Patient's mother notes patient holds onto her outdoors.    Time  4    Period  Weeks    Status  New    Target Date  08/22/17            Plan - 08/14/17 1620    Clinical Impression Statement  Initiated endurance training on Nustep to begin to explore ways for pt to engage [facility].  Pt able to tolerate 10 minutes with bilat UE and LE and continue on to standing balance and LE strengthening exercises without rest break.  Continued SLS and LE concentric and eccentric strengthening for stair negotiation training and standing balance training in partial SLS while playing continuous zoom ball for endurance and UE strengthening.  Will continue to progress towards LTG.    Rehab Potential  Good    Clinical Impairments Affecting Rehab Potential  1 year history of PEG Tube use for nutrition due to aspiration PNA    PT Frequency  2x / week    PT Duration  4 weeks    PT Treatment/Interventions  ADLs/Self Care Home Management;Gait training;Stair training;Functional mobility training;Therapeutic activities;Therapeutic exercise;Balance training;Neuromuscular re-education;Patient/family education;Energy conservation    PT Next Visit Plan  LTG due by end of week; will likely recertify for 2x/week x 4 more weeks in conjunction with SLP.  Descending stairs with increased eccentric control (step ups/downs on 2") Combine balance with zoom ball after stretching anterior chest mm (variety of stances, surfaces, tall kneeling, etc); gait outside over variety of surfaces, stability ball, balance on compliant surfaces (cone taps/balance beam), postural exercises    Consulted and Agree with Plan of Care  Patient;Family member/caregiver    Family Member Consulted  Mother       Patient will benefit from skilled therapeutic intervention in order to improve the following deficits and impairments:  Impaired flexibility, Decreased balance, Decreased activity tolerance, Decreased endurance, Decreased strength, Postural dysfunction, Abnormal gait  Visit Diagnosis: Unsteadiness on feet  Muscle weakness (generalized)  Ataxic gait  Ataxic cerebral palsy [facility](HCC)     Problem List Patient Active Problem List   Diagnosis Date Noted  . Asthma with status asthmaticus 07/13/2013  . Hypothyroidism  07/13/2013  . Cerebral palsy (HCC) 07/13/2013  . Schizo affective schizophrenia (HCC) 07/13/2013  . 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  Anxiety 07/13/2013  . Status asthmaticus 07/13/2013    Dierdre Highman, PT, DPT 08/14/17    4:25 PM     [facility] [address] [place], [place], [address] Phone: [phone]   Fax:  [phone]  Name: Angela Nicholson MRN: [id number] Date of Birth: 17-Mar-1971

## 2017-08-17 ENCOUNTER — Ambulatory Visit: Payer: Medicare Other | Admitting: Physical Therapy

## 2017-08-17 ENCOUNTER — Ambulatory Visit: Payer: Medicare Other

## 2017-08-17 ENCOUNTER — Encounter: Payer: Self-pay | Admitting: Physical Therapy

## 2017-08-17 DIAGNOSIS — G804 Ataxic cerebral palsy: Secondary | ICD-10-CM

## 2017-08-17 DIAGNOSIS — R260 Ataxic gait: Secondary | ICD-10-CM

## 2017-08-17 DIAGNOSIS — M6281 Muscle weakness (generalized): Secondary | ICD-10-CM

## 2017-08-17 DIAGNOSIS — R2681 Unsteadiness on feet: Secondary | ICD-10-CM

## 2017-08-17 DIAGNOSIS — R1312 Dysphagia, oropharyngeal phase: Secondary | ICD-10-CM

## 2017-08-17 NOTE — Therapy (Signed)
[facility] [address] [place], [place], [address] Phone: [phone]   Fax:  [phone]  Physical Therapy Treatment  Patient Details  Name: Angela Nicholson MRN: [id number] Date of Birth: 1971-03-15 Referring Provider: Kip A. Corrington, [facility]   Encounter Date: 08/17/2017  PT End of Session - 08/17/17 1641    Visit Number  15    Number of Visits  16    Date for PT Re-Evaluation  08/22/17    Authorization Type  Medicare now primary; visits added.      PT Start Time  1540    PT Stop Time  1627    PT Time Calculation (min)  47 min    Activity Tolerance  Patient tolerated treatment well    Behavior During Therapy  WFL for tasks assessed/performed       Past Medical History:  Diagnosis Date  . Asthma   . Barrett esophagus   . Deaf   . GERD (gastroesophageal reflux disease)   . Schizo affective schizophrenia ([place])     History reviewed. No pertinent surgical history.  There were no vitals filed for this visit.  Subjective Assessment - 08/17/17 1545    Subjective  Had SLP evaluation today, has exercises to work on.  Worked on a 1,000 piece puzzle over the weekend.      Pertinent History  ataxic cerebral palsy, deaf, PEG tube, aspiration PNA, and asthma    Patient Stated Goals  To get stronger and to eat with her mouth again.    Currently in Pain?  No/denies                      [facility] Adult PT Treatment/Exercise - 08/17/17 1602      Ambulation/Gait   Ambulation/Gait  Yes    Ambulation/Gait Assistance  4: Min assist    Ambulation/Gait Assistance Details  outside over pavement, blacktop and grass to assess balance and confidence with community mobility.  Pt continues to report feeling fearful and asking to hold onto therapist 3/4 of the distance    Ambulation Distance (Feet)  1000 Feet    Assistive device  None    Gait Pattern  Step-through  pattern;Decreased step length - right;Decreased step length - left;Decreased stride length;Decreased hip/knee flexion - right;Decreased hip/knee flexion - left;Decreased dorsiflexion - right;Decreased dorsiflexion - left;Shuffle;Lateral hip instability;Poor foot clearance - left;Poor foot clearance - right    Ambulation Surface  Unlevel;Outdoor;Paved;Grass    Curb  4: Min assist    Curb Details (indicate cue type and reason)  with one HHA          Balance Exercises - 08/17/17 1618      Balance Exercises: Standing   Tandem Stance  Eyes open;Foam/compliant surface;Upper extremity support 2;5 reps;10 secs    Balance Beam  blue foam beam: 2 trips each: tandem gait, side stepping and cross overs with min-mod A and UE support        PT Education - 08/17/17 1640    Education provided  Yes    Education Details  plan for further visits/recertification; plan to transition to community wellness at [facility] after next certification period    Person(s) Educated  Patient;Parent(s)    Methods  Explanation    Comprehension  Verbalized understanding          PT Long Term Goals - 08/17/17 1548      PT LONG TERM GOAL #1   Title  Pt will perform HEP and walking program 3/7 days per week with supervision of mother    Baseline  Intermittently performing due to recent illness and weather.      Time  4    Period  Weeks    Status  Revised      PT LONG TERM GOAL #2   Title  Pt will improve muscle strength by 1/2 mm grade on MMT    Baseline  3/5 hip flexion, 3+/5 knee flexion, ankle DF, 4/5 knee extension; on 07/24/17 improved to 3+/5 hip flexion, 4/5 knee flexion and ankle DF, knee extension remains 4/5     Time  4    Period  Weeks    Status  Achieved      PT LONG TERM GOAL #3   Title  The patient will ambulate nonstop x 10 minutes in order to improve community ambulation.     Baseline  Fatigued after 6 minutes of ambulation.    Time  4    Period  Weeks    Status  New      PT LONG TERM GOAL #4    Title  Improve FGA from 19/30 up to 22/30.    Baseline  19/30 on 07/17/17    Time  4    Period  Weeks    Status  Revised      PT LONG TERM GOAL #5   Title  The patient will negotiate community surfaces (parking lots, curb, grass) without UE support x 500 ft for improved community negotiation.    Baseline  1,000 outside with one and then two person HHA for most of the distance    Time  4    Period  Weeks    Status  Partially Met            Plan - 08/17/17 1641    Clinical Impression Statement  Initiated assessment of progress towards LTG with gait outside on variety of surfaces; pt able to go double the distance set for goal at eval but continues to require min HHA for balance and confidence.  Continued to perform dynamic standing balance and proximal hip strengthening exercises on more compliant balance beam; continues to require significant UE support and external support from therapist.  Will continue assessment of LTG and recertify next visit    Rehab Potential  Good    Clinical Impairments Affecting Rehab Potential  1 year history of PEG Tube use for nutrition due to aspiration PNA    PT Frequency  1x / week    PT Duration  8 weeks    PT Treatment/Interventions  ADLs/Self Care Home Management;Gait training;Stair training;Functional mobility training;Therapeutic activities;Therapeutic exercise;Balance training;Neuromuscular re-education;Patient/family education;Energy conservation    PT Next Visit Plan  finish LTG and recertify, 1x/week x 8 more weeks.  Descending stairs with increased eccentric control (step ups/downs on 2") Combine balance with zoom ball after stretching anterior chest mm (variety of stances, surfaces, tall kneeling, etc); gait outside over variety of surfaces, stability ball, balance on compliant surfaces (cone taps/balance beam), postural exercises    Consulted and Agree with Plan of Care  Patient;Family member/caregiver    Family Member Consulted  Mother        Patient will benefit from skilled therapeutic intervention in order to improve the following deficits and impairments:  Impaired flexibility, Decreased balance, Decreased activity tolerance, Decreased endurance, Decreased strength, Postural dysfunction, Abnormal gait  Visit Diagnosis: Unsteadiness on feet  Muscle weakness (generalized)  Ataxic gait  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 Asthma with status asthmaticus 07/13/2013  . Hypothyroidism 07/13/2013  . Cerebral palsy ([place]) 07/13/2013  . Schizo affective schizophrenia ([place]) 07/13/2013  . 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 Anxiety 07/13/2013  . Status asthmaticus 07/13/2013    Rico Junker, PT, DPT 08/17/17    4:52 PM    [facility] [address] [place], [place], [address] Phone: [phone]   Fax:  [phone]  Name: Camani Sesay MRN: [id number] Date of Birth: [birth year]

## 2017-08-17 NOTE — Therapy (Signed)
[facility] [address] [place], [place], [address] Phone: [phone]   Fax:  [phone]  Speech Language Pathology Evaluation  Patient Details  Name: Angela Nicholson MRN: [id number] Date of Birth: [birth year] Referring Provider: Candie Chroman, MD   Encounter Date: 08/17/2017  End of Session - 08/17/17 1510    Visit Number  1    Number of Visits  17    Date for SLP Re-Evaluation  10/23/17    SLP Start Time  1403    SLP Stop Time   1448    SLP Time Calculation (min)  45 min    Activity Tolerance  Patient tolerated treatment well       Past Medical History:  Diagnosis Date  . Asthma   . Barrett esophagus   . Deaf   . GERD (gastroesophageal reflux disease)   . Schizo affective schizophrenia (HCC)     No past surgical history on file.  There were no vitals filed for this visit.      SLP Evaluation [facility] - 08/17/17 1424      SLP Visit Information   SLP Received On  08/17/17    Referring Provider  Candie Chroman, MD    Onset Date  at least before 2017    Medical Diagnosis  Ataxic CP      Subjective   Patient/Family Stated Goal  "To be able to swallow"      Pain Assessment   Currently in Pain?  No/denies      General Information   HPI  Pt is 47 y.o. female with chronic dysphagia and aspiration PNA with PEG, who was deconditioned. She has undergone PT and stamina/strength is improving. PT thought since stamina/strength improving pt's swallow may have improved - MBSS completed 08-06-17 with NPO recommended with OP ST. Mild or and mod-severe pharyngeal dysphagia, c/b decr'd laryngeal vestibule closure and decr'd swallow coordination/swallow timing leading to SILENT penetration of all liquid consistencies before the swallow. Decr'd laryngeal elevation and tongue based retraction resulted in vallecular and pyriform residues noted after the swallow that were SILENTLY aspirated. Cued cough did not clear all  aspirates. FEES recommended as f/u exam to visualize events post swallow.       Prior Functional Status   Cognitive/Linguistic Baseline  Within functional limits      Cognition   Overall Cognitive Status  Within Functional Limits for tasks assessed      Auditory Comprehension   Overall Auditory Comprehension  Appears within functional limits for tasks assessed      Verbal Expression   Overall Verbal Expression  Other (comment) expressively communicates via sign language    Non-Verbal Means of Communication  Sign lanquage (ALS or other)      Oral Motor/Sensory Function   Overall Oral Motor/Sensory Function  SLP focused today on education post-modifed, and explanation/demonstration of HEP.        SLP told pt/mother that SLP's goal was for pt to eat something safely, given fair prognosis from modified barium swallow exam.   SLP then developed a HEP for pt and pt was instructed how to perform exercises involving lingual, vocal, and pharyngeal strengthening. Explanation was provided for pt via sign interpreter. SLP performed each exercise and pt return demonstrated each exercise. SLP ensured pt performance was correct prior to moving on to next exercise. Pt had significant difficulty with Clair Gulling and pt was told she and SLP would cont to practice that exercise at therapy sessions.  Pt was instructed  to complete this program with ONE-THREE REPS OF EACH EXERCISE, then MOVE ON TO NEXT EXERCISE, up to the recommended number of reps. She was to complete 2-3 times a day for the next 8-10 weeks.  Pt was told SLP performing MBSS recommended a Fiberendoscopic eval of swallowing (FEES) for follow up exam.                SLP Education - 09/12/17 1509    Education provided  Yes    Education Details  basic swallow anatomy and function of swallow, HEP for dysphagia, goal of swallowing therapy   Person(s) Educated  Patient;Parent(s)    Methods  Explanation;Demonstration;Verbal cues;Handout  in presence of sign interpretor   in presence of sign interpretor   Comprehension  Verbalized understanding;Returned demonstration;Verbal cues required;Need further instruction in presence of sign interpreter   in presence of sign interpreter      SLP Short Term Goals - 2017-09-12 1513      SLP SHORT TERM GOAL #1   Title  pt will demo swallowing HEP with occasional min A over three sessions    Time  4    Period  Weeks    Status  New      SLP SHORT TERM GOAL #2   Title  pt will tell SLP 3 overt s/s aspiration PNA with rare min A over two sessions    Time  4    Period  Weeks    Status  New       SLP Long Term Goals - 2017/09/12 1517      SLP LONG TERM GOAL #1   Title  pt will complete HEP at least 6/7 days/week, reported by mother and/or pt    Time  8    Period  Weeks    Status  New      SLP LONG TERM GOAL #2   Title  pt will complete swallowing HEP with rare min A    Time  8    Period  Weeks    Status  New      SLP LONG TERM GOAL #3   Title  pt will tell SLP 3 overt s/s aspiratoin PNA in 3 therapy sessions    Time  8    Period  Weeks    Status  New       Plan - 09-12-17 1511    Clinical Impression Statement  Pt presetns with mild oral and mod-severe pharyngeal dysphagia as ID'd on modified (MBSS) on 08-06-17. SILENT penetration and aspiration noted with all liquid consistencies. See "HPI" for details.     Speech Therapy Frequency  2x / week    Duration  -- 8 weeks/17 total visits    Treatment/Interventions  Aspiration precaution training;Pharyngeal strengthening exercises;Internal/external aids;Multimodal communcation approach;Patient/family education;Compensatory strategies;SLP instruction and feedback    Potential to Achieve Goals  Fair    Potential Considerations  Severity of impairments;Previous level of function       Patient will benefit from skilled therapeutic intervention in order to improve the following deficits and impairments:   Oropharyngeal  dysphagia  G-Codes - 09-12-2017 1519    Functional Assessment Tool Used  clinical judgment    Functional Limitations  Swallowing    Swallow Current Status (Z6109)  100 percent impaired, limited or restricted    Swallow Goal Status (U0454)  At least 80 percent but less than 100 percent impaired, limited or restricted       Problem List Patient Active Problem List  Diagnosis Date Noted  . Asthma with status asthmaticus 07/13/2013  . Hypothyroidism 07/13/2013  . Cerebral palsy (HCC) 07/13/2013  . Schizo affective schizophrenia (HCC) 07/13/2013  . 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 Anxiety 07/13/2013  . Status asthmaticus 07/13/2013    [facility] CenterCHINKE,Rolan Wrightsman ,MS, CCC-SLP  08/17/2017, 3:22 PM  [facility] [address] [place], [place], [address] Phone: [phone]   Fax:  [phone]  Name: Angela Nicholson MRN: [id number] Date of Birth: 10/09/1970

## 2017-08-17 NOTE — Patient Instructions (Addendum)
SWALLOWING EXERCISES Do one repetition of each exercise (2-3 of gauze hold and straw suck) and then move to the next exercise  1. Effortful Swallows - Press your tongue against the roof of your mouth for 3 seconds, then squeeze the muscles in your neck while you swallow your saliva or a sip of water - Repeat 20 times, 2-3 times a day, and use whenever you eat or drink  2. Masako Swallow - swallow with your tongue sticking out - Stick tongue out past your teeth and gently bite tongue with your teeth - Swallow, while holding your tongue with your teeth - Repeat 10-15 times, 2-3 times a day *use a wet spoon if your mouth gets dry*  3. Gauze hold - hold your tongue with a piece of gauze, pull your tongue in your mouth for 1 second - Repeat 10-15 times, 2-3 times a day  4. Shaker Exercise - head lift - Lie flat on your back in your bed or on a couch without pillows - Raise your head and look at your feet - KEEP YOUR SHOULDERS DOWN - HOLD FOR 45-60 SECONDS, then lower your head back down - Repeat 3 times, 2-3 times a day  5. Mendelsohn Maneuver - "half swallow" exercise - Start to swallow, and keep your Adam's apple up by squeezing hard with the            muscles of the throat - Hold the squeeze for 5-7 seconds and then relax - Repeat 20 times, 2-3 times a day *use a wet spoon if your mouth gets dry*  6. Towel hold - Roll up a towel to about 4" in diameter and hold underneath your chin - press you chin down against the towel for 45-60 seconds - Repeat 3 times, 2-3 times a day        7.  Straw suck  - hold a straw in your mouth  - suck in for 2-3 seconds  - repeat 15-20 times, 2-3 times each day

## 2017-08-21 ENCOUNTER — Encounter: Payer: Self-pay | Admitting: Physical Therapy

## 2017-08-21 ENCOUNTER — Ambulatory Visit: Payer: Medicare Other | Attending: Family Medicine | Admitting: Physical Therapy

## 2017-08-21 DIAGNOSIS — M6281 Muscle weakness (generalized): Secondary | ICD-10-CM

## 2017-08-21 DIAGNOSIS — R2681 Unsteadiness on feet: Secondary | ICD-10-CM

## 2017-08-21 DIAGNOSIS — R260 Ataxic gait: Secondary | ICD-10-CM

## 2017-08-21 DIAGNOSIS — R1312 Dysphagia, oropharyngeal phase: Secondary | ICD-10-CM | POA: Insufficient documentation

## 2017-08-21 DIAGNOSIS — G804 Ataxic cerebral palsy: Secondary | ICD-10-CM

## 2017-08-21 NOTE — Therapy (Signed)
[facility] [address] [place], [place], [address] Phone: [phone]   Fax:  [phone]  Physical Therapy Treatment  Patient Details  Name: Angela Nicholson MRN: [id number] Date of Birth: Sep 28, 1970 Referring Provider: Kip A. Corrington, MD   Encounter Date: 08/21/2017  PT End of Session - 08/21/17 1559    Visit Number  16    Number of Visits  16    Date for PT Re-Evaluation  08/22/17    Authorization Type  Medicare now primary; visits added.      PT Start Time  1450    PT Stop Time  1530    PT Time Calculation (min)  40 min    Activity Tolerance  Patient tolerated treatment well    Behavior During Therapy  WFL for tasks assessed/performed       Past Medical History:  Diagnosis Date  . Asthma   . Barrett esophagus   . Deaf   . GERD (gastroesophageal reflux disease)   . Schizo affective schizophrenia ([place])     History reviewed. No pertinent surgical history.  There were no vitals filed for this visit.  Subjective Assessment - 08/21/17 1452    Subjective  Worked on the Speech exercises on Tuesday; did well with them.  No issues to report.  Mother is checking on how much it would cost her to do an exercise program at [facility]    Pertinent History  ataxic cerebral palsy, deaf, PEG tube, aspiration PNA, and asthma    Patient Stated Goals  To get stronger and to eat with her mouth again.    Currently in Pain?  No/denies         [facility] PT Assessment - 08/21/17 1456      Assessment   Medical Diagnosis  muscular deconditioning; cerebral palsy    Referring Provider  Kip A. Corrington, MD    Onset Date/Surgical Date  05/21/16    Prior Therapy  PT as an infant, SLP recently due to aspiration PNA      Precautions   Precautions  Other (comment)    Precaution Comments  ataxic cerebral palsy, deaf, PEG tube, aspiration PNA, and asthma.        Prior Function   Level of Independence  Independent      6 Minute  Walk- Baseline   6 Minute Walk- Baseline  yes    BP (mmHg)  98/72    HR (bpm)  73    02 Sat (%RA)  96 %    Modified Borg Scale for Dyspnea  0- Nothing at all    Perceived Rate of Exertion (Borg)  6-      6 Minute walk- Post Test   6 Minute Walk Post Test  yes    BP (mmHg)  107/74    HR (bpm)  80    02 Sat (%RA)  97 %    Modified Borg Scale for Dyspnea  0- Nothing at all    Perceived Rate of Exertion (Borg)  12-      6 minute walk test results    Endurance additional comments  distance not assessed this test; more focused on endurance and time: pt ambulated x 10 minutes total with short sitting break at 4 minutes      Functional Gait  Assessment   Gait assessed   Yes    Gait Level Surface  Walks 20 ft, slow speed, abnormal gait pattern, evidence for imbalance or deviates 10-15 in outside  of the 12 in walkway width. Requires more than 7 sec to ambulate 20 ft.    Change in Gait Speed  Able to smoothly change walking speed without loss of balance or gait deviation. Deviate no more than 6 in outside of the 12 in walkway width.    Gait with Horizontal Head Turns  Performs head turns smoothly with no change in gait. Deviates no more than 6 in outside 12 in walkway width    Gait with Vertical Head Turns  Performs head turns with no change in gait. Deviates no more than 6 in outside 12 in walkway width.    Gait and Pivot Turn  Pivot turns safely within 3 sec and stops quickly with no loss of balance.    Step Over Obstacle  Is able to step over 2 stacked shoe boxes taped together (9 in total height) without changing gait speed. No evidence of imbalance.    Gait with Narrow Base of Support  Ambulates 4-7 steps.    Gait with Eyes Closed  Walks 20 ft, no assistive devices, good speed, no evidence of imbalance, normal gait pattern, deviates no more than 6 in outside 12 in walkway width. Ambulates 20 ft in less than 7 sec.    Ambulating Backwards  Walks 20 ft, no assistive devices, good speed, no  evidence for imbalance, normal gait    Steps  Alternating feet, must use rail.    Total Score  25    FGA comment:  25/30                  [facility] Adult PT Treatment/Exercise - 08/21/17 1450      Ambulation/Gait   Ambulation/Gait  Yes    Ambulation/Gait Assistance  5: Supervision    Ambulation Distance (Feet)  2500 Feet ~2500    Assistive device  None    Gait Pattern  Step-through pattern;Decreased step length - right;Decreased step length - left;Decreased stride length;Decreased hip/knee flexion - right;Decreased hip/knee flexion - left;Decreased dorsiflexion - right;Decreased dorsiflexion - left;Shuffle;Lateral hip instability;Poor foot clearance - right;Poor foot clearance - left    Ambulation Surface  Indoor;Level    Stairs  Yes    Stairs Assistance  6: Modified independent (Device/Increase time)    Stair Management Technique  Two rails    Number of Stairs  4    Height of Stairs  6             PT Education - 08/21/17 1557    Education provided  Yes    Education Details  Pt educated on improvements and goals. pt instructed to ambulate more frequently on grass and other uneven surfaces    Person(s) Educated  Patient;Caregiver(s)    Methods  Explanation;Verbal cues    Comprehension  Need further instruction;Other (comment)          PT Long Term Goals - 08/21/17 1601      PT LONG TERM GOAL #1   Title  Pt will perform HEP and walking program 3/7 days per week with supervision of mother    Baseline  --    Time  4    Period  Weeks    Status  Achieved      PT LONG TERM GOAL #2   Title  Pt will improve muscle strength by 1/2 mm grade on MMT    Baseline  3/5 hip flexion, 3+/5 knee flexion, ankle DF, 4/5 knee extension; on 07/24/17 improved to 3+/5 hip flexion, 4/5 knee flexion  and ankle DF, knee extension remains 4/5     Time  4    Period  Weeks    Status  Achieved      PT LONG TERM GOAL #3   Title  The patient will ambulate nonstop x 10 minutes in order to  improve community ambulation.     Baseline  PROGRESSING:(2/1) pt ambulates 10 min with one rest break at 4 min 2/2 fatigue    Time  4    Period  Weeks    Status  Partially Met      PT LONG TERM GOAL #4   Title  Improve FGA from 19/30 up to 22/30.    Baseline  MET  25/30 (2/1)    Time  4    Period  Weeks    Status  Achieved      PT LONG TERM GOAL #5   Title  The patient will negotiate community surfaces (parking lots, curb, grass) without UE support x 500 ft for improved community negotiation.    Baseline  1,000 outside with one and then two person HHA for most of the distance    Time  4    Period  Weeks    Status  Partially Met         08/21/17 1604  Plan  Clinical Impression Statement Treatment session focused on continued assessment of progress towards LTG.  Pt is making good progress and has met 3/5 LTG demonstrating improved dynamic balance and safety during gait, improved activity tolerance and endurance and improved LE strength.  Pt continues to require intermittent rest breaks during longer periods of activity and hands on assistance to prevent fall during community gait, over uneven surfaces or when negotiating obstacles and stairs without UE support.  Pt would benefit from ongoing skilled PT services to continue to address endurance, balance and gait impairments to maximize functional mobility independence and decrease risk for falls.  Pt will benefit from skilled therapeutic intervention in order to improve on the following deficits Impaired flexibility;Decreased balance;Decreased activity tolerance;Decreased endurance;Decreased strength;Postural dysfunction;Abnormal gait  Rehab Potential Good  Clinical Impairments Affecting Rehab Potential 1 year history of PEG Tube use for nutrition due to aspiration PNA  PT Frequency 1x / week  PT Duration 8 weeks  PT Treatment/Interventions ADLs/Self Care Home Management;Gait training;Stair training;Functional mobility training;Therapeutic  activities;Therapeutic exercise;Balance training;Neuromuscular re-education;Patient/family education;Energy conservation  PT Next Visit Plan Continue to progress PT confidance and abilitys with community ambulation. Grass/curbs/ ramps/ outdoors. in order to continue to progress towards meeting all LTGs.   Consulted and Agree with Plan of Care Patient;Family member/caregiver  Family Member Consulted Mother          Patient will benefit from skilled therapeutic intervention in order to improve the following deficits and impairments:  Impaired flexibility, Decreased balance, Decreased activity tolerance, Decreased endurance, Decreased strength, Postural dysfunction, Abnormal gait  Visit Diagnosis: Muscle weakness (generalized)  Ataxic gait  Ataxic cerebral palsy (HCC)  Unsteadiness on feet     Problem List Patient Active Problem List   Diagnosis Date Noted  . Asthma with status asthmaticus 07/13/2013  . Hypothyroidism 07/13/2013  . Cerebral palsy ([place]) 07/13/2013  . Schizo affective schizophrenia ([place]) 07/13/2013  . 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 Anxiety 07/13/2013  . Status asthmaticus 07/13/2013    Waunita Schooner SPT 08/21/2017, 4:06 PM  [facility] [address]  [place], [place], [address] Phone: [phone]   Fax:  [phone]  Name: Kemaya Dorner MRN: [id number] Date of Birth: 12-21-1970

## 2017-08-27 ENCOUNTER — Ambulatory Visit: Payer: Medicare Other | Admitting: Physical Therapy

## 2017-08-27 ENCOUNTER — Ambulatory Visit: Payer: Medicare Other | Admitting: Speech Pathology

## 2017-08-27 ENCOUNTER — Encounter: Payer: Self-pay | Admitting: Physical Therapy

## 2017-08-27 DIAGNOSIS — R2681 Unsteadiness on feet: Secondary | ICD-10-CM

## 2017-08-27 DIAGNOSIS — M6281 Muscle weakness (generalized): Secondary | ICD-10-CM

## 2017-08-27 DIAGNOSIS — R1312 Dysphagia, oropharyngeal phase: Secondary | ICD-10-CM

## 2017-08-27 DIAGNOSIS — R260 Ataxic gait: Secondary | ICD-10-CM

## 2017-08-27 DIAGNOSIS — G804 Ataxic cerebral palsy: Secondary | ICD-10-CM

## 2017-08-27 NOTE — Therapy (Signed)
[facility] [address] [place], [place], [address] Phone: [phone]   Fax:  [phone]  Physical Therapy Treatment  Patient Details  Name: Angela ManorVeronica Ozanich MRN: [id number] Date of Birth: 03/11/1971 Referring Provider: Kip A. Corrington, MD   Encounter Date: 08/27/2017  PT End of Session - 08/27/17 2129    Visit Number  17    Number of Visits  24    Date for PT Re-Evaluation  10/21/17    Authorization Type  Medicare now primary; visits added.      PT Start Time  0935    PT Stop Time  1015    PT Time Calculation (min)  40 min    Activity Tolerance  Patient tolerated treatment well    Behavior During Therapy  WFL for tasks assessed/performed       Past Medical History:  Diagnosis Date  . Asthma   . Barrett esophagus   . Deaf   . GERD (gastroesophageal reflux disease)   . Schizo affective schizophrenia (HCC)     History reviewed. No pertinent surgical history.  There were no vitals filed for this visit.  Subjective Assessment - 08/27/17 0937    Subjective  Pt has new glasses, feels her vision is clearer and easier to see differences between surfaces.  Mother feels pt is more confident.  Has a new bed that has a wedge to keep her more elevated when she sleeps.    Pertinent History  ataxic cerebral palsy, deaf, PEG tube, aspiration PNA, and asthma    Patient Stated Goals  To get stronger and to eat with her mouth again.    Currently in Pain?  No/denies                      [facility] Adult PT Treatment/Exercise - 08/27/17 1014      Ambulation/Gait   Ambulation/Gait  Yes    Ambulation/Gait Assistance  5: Supervision;4: Min assist    Ambulation/Gait Assistance Details  outside with decreased UE support from therapist while performing gait over sidewalk pavement with head turns in various directions, head turns while scanning looking for ball, weaving around cones and weaving with alteranting LE  cone taps.  When performing side stepping R/L and forwards/retro gait on mulch and gravel pt required min HHA for balance.    Ambulation Distance (Feet)  1000 Feet    Assistive device  None    Gait Pattern  Step-through pattern;Decreased step length - right;Decreased step length - left;Decreased stride length;Decreased hip/knee flexion - right;Decreased hip/knee flexion - left;Decreased dorsiflexion - right;Decreased dorsiflexion - left;Shuffle;Poor foot clearance - right;Poor foot clearance - left    Ambulation Surface  Level;Unlevel;Outdoor;Paved;Gravel;Other (comment) mulch, blacktop    Curb  5: Supervision    Curb Details (indicate cue type and reason)  multiple times; pt reports fear with descending curb to blacktop in the parking lot.  Pt fearful of cars hitting her.  Discussed with pt and demonstrated importance of looking up from the ground and scanning environment for safety.             PT Education - 08/27/17 2135    Education provided  Yes    Education Details  safety when ambulating outside    Starwood HotelsPerson(s) Educated  Patient;Parent(s)    Methods  Explanation    Comprehension  Verbalized understanding       PT Short Term Goals - 08/23/17 1432      PT SHORT TERM  GOAL #1   Title  = LTG        PT Long Term Goals - 08/23/17 1433      PT LONG TERM GOAL #1   Title  Pt will participate in HEP, walking program and community wellness at [facility] 3-4/7 days per week with mother    Time  8    Period  Weeks    Status  Revised    Target Date  10/21/17      PT LONG TERM GOAL #3   Title  The patient will ambulate nonstop x 10 minutes in order to improve community ambulation.     Baseline  PROGRESSING:(2/1) pt ambulates 10 min with one rest break at 4 min 2/2 fatigue    Time  8    Period  Weeks    Status  Revised    Target Date  10/21/17      PT LONG TERM GOAL #4   Title  Patient will negotiate 8 stairs without use of rails and supervision    Time  8    Period  Weeks     Status  New    Target Date  10/21/17      PT LONG TERM GOAL #5   Title  The patient will negotiate community surfaces (parking lots, curb, grass) without UE support x 1000 ft for improved community negotiation.    Baseline  1,000 outside with one and then two person HHA for most of the distance    Time  8    Period  Weeks    Status  Revised    Target Date  10/21/17            Plan - 08/27/17 2136    Clinical Impression Statement  Due to patient having new glasses, continued gait training outside over variety of surfaces focusing on visual scanning, safety and balance in community setting.  Also performed balance on softer/more compliant surfaces.  Pt demonstrated significantly improved balance and confidence, only grabbing therapist's hands during ambulation on blacktop in parking lot due to fear of cars, during cone taps and on compliant surfaces.  Will continue to progress towards    Rehab Potential  Good    Clinical Impairments Affecting Rehab Potential  1 year history of PEG Tube use for nutrition due to aspiration PNA    PT Frequency  1x / week    PT Duration  8 weeks    PT Treatment/Interventions  ADLs/Self Care Home Management;Gait training;Stair training;Functional mobility training;Therapeutic activities;Therapeutic exercise;Balance training;Neuromuscular re-education;Patient/family education;Energy conservation    PT Next Visit Plan  endurance, gait outside with visual scanning, balance on compliant surfaces, stair negotiation alternating sequence    Consulted and Agree with Plan of Care  Patient;Family member/caregiver    Family Member Consulted  Mother       Patient will benefit from skilled therapeutic intervention in order to improve the following deficits and impairments:  Impaired flexibility, Decreased balance, Decreased activity tolerance, Decreased endurance, Decreased strength, Postural dysfunction, Abnormal gait  Visit Diagnosis: Muscle weakness  (generalized)  Ataxic gait  Ataxic cerebral palsy (HCC)  Unsteadiness on feet     Problem List Patient Active Problem List   Diagnosis Date Noted  . Asthma with status asthmaticus 07/13/2013  . Hypothyroidism 07/13/2013  . Cerebral palsy (HCC) 07/13/2013  . Schizo affective schizophrenia (HCC) 07/13/2013  . 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  Anxiety 07/13/2013  . Status asthmaticus 07/13/2013    Dierdre Highman, PT, DPT 08/27/17    9:42 PM    [facility] [address] [place], [place], [address] Phone: [phone]   Fax:  [phone]  Name: Saudia Smyser MRN: [id number] Date of Birth: 09/14/1970

## 2017-08-27 NOTE — Patient Instructions (Signed)
  Youtube - FEES (Fiberoptic evaluation of swallowing) and Modified Barium Swallow  Focus this week, instead of holding up the voice box/Adam's apple, just feeling it rise and fall with each swallow  Focus also on getting good strong blows through the straw  You can also watch some therapists do the swallow exercises on youtube  Do the tug of war with your gauze and tongue - if mom is not sure if she is doing that correctly, she can gently tug on the tongue and see if she is pulling a little with her tongue   Do exercises 3x each, twice a day

## 2017-08-27 NOTE — Therapy (Signed)
[facility] [address] [place], [place], [address] Phone: [phone]   Fax:  [phone]  Speech Language Pathology Treatment  Patient Details  Name: Angela Nicholson MRN: [id number] Date of Birth: 01/10/1971 Referring Provider: Candie ChromanKorrington, Kip, MD   Encounter Date: 08/27/2017  End of Session - 08/27/17 1207    Visit Number  2    Number of Visits  17    Date for SLP Re-Evaluation  10/23/17    SLP Start Time  1107    SLP Stop Time   1150    SLP Time Calculation (min)  43 min    Activity Tolerance  Patient tolerated treatment well       Past Medical History:  Diagnosis Date  . Asthma   . Barrett esophagus   . Deaf   . GERD (gastroesophageal reflux disease)   . Schizo affective schizophrenia (HCC)     No past surgical history on file.  There were no vitals filed for this visit.  Subjective Assessment - 08/27/17 1157    Subjective  "I did them 3 days" re: swallow exercises    Patient is accompained by:  Family member;Interpreter mom, ASL interpreter Felicia    Currently in Pain?  No/denies            ADULT SLP TREATMENT - 08/27/17 1158      General Information   Behavior/Cognition  Alert;Cooperative;Pleasant mood    Patient Positioning  Upright in chair      Treatment Provided   Treatment provided  Dysphagia      Dysphagia Treatment   Patient observed directly with PO's  No    Feeding  -- NPO due to h/o of chronic aspiration pna    Type of cueing  Verbal;Tactile;Visual    Amount of cueing  Moderate    Other treatment/comments  Pt perfromed HEP for dysphagia with occasional min A for most excercises. Exceptions were straw suck, despite max tactile, visual (suck in tissue and instruction,) pt unbale to coordinate this. I brought it down to blowing out with mod A - pt to focus on blowing air this week, to lead up to coordination of such. Pt did not complete Mendelson despite max A. Pt to focus on  using whole had to feel her larynx rise and drop with each swallow to help train for Mendelson. Pt required extended time and encourgagement to complete [place]ako 1x this session.      Dysphagia Recommendations   Diet recommendations  NPO      Progression Toward Goals   Progression toward goals  Progressing toward goals      General Information   Reason PO's not observed  Other (comment) NPO       SLP Education - 08/27/17 1204    Education provided  Yes    Education Details  HEP for dysphagia; some modifications to HEP to facilitate success     Person(s) Educated  Patient;Caregiver(s)    Methods  Explanation;Demonstration;Tactile cues;Verbal cues;Handout    Comprehension  Verbalized understanding;Returned demonstration;Verbal cues required;Tactile cues required;Need further instruction       SLP Short Term Goals - 08/27/17 1206      SLP SHORT TERM GOAL #1   Title  pt will demo swallowing HEP with occasional min A over three sessions    Time  4    Period  Weeks    Status  On-going      SLP SHORT TERM GOAL #2   Title  pt will tell SLP 3 overt s/s aspiration PNA with rare min A over two sessions    Time  4    Period  Weeks    Status  On-going       SLP Long Term Goals - 08/27/17 1207      SLP LONG TERM GOAL #1   Title  pt will complete HEP at least 6/7 days/week, reported by mother and/or pt    Time  8    Period  Weeks    Status  On-going      SLP LONG TERM GOAL #2   Title  pt will complete swallowing HEP with rare min A    Time  8    Period  Weeks    Status  On-going      SLP LONG TERM GOAL #3   Title  pt will tell SLP 3 overt s/s aspiratoin PNA in 3 therapy sessions    Time  8    Period  Weeks    Status  On-going       Plan - 08/27/17 1204    Clinical Impression Statement  Pt requires min to max A for HEP for dysphagia - continue skilled ST for possible, eventual pleasure feeds safely    Speech Therapy Frequency  2x / week    Treatment/Interventions   Aspiration precaution training;Pharyngeal strengthening exercises;Internal/external aids;Multimodal communcation approach;Patient/family education;Compensatory strategies;SLP instruction and feedback    Potential Considerations  Severity of impairments;Previous level of function       Patient will benefit from skilled therapeutic intervention in order to improve the following deficits and impairments:   Oropharyngeal dysphagia    Problem List Patient Active Problem List   Diagnosis Date Noted  . Asthma with status asthmaticus 07/13/2013  . Hypothyroidism 07/13/2013  . Cerebral palsy (HCC) 07/13/2013  . Schizo affective schizophrenia (HCC) 07/13/2013  . 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 Anxiety 07/13/2013  . Status asthmaticus 07/13/2013    Angela Nicholson, CCC-SLP 08/27/2017, 12:08 PM  [facility] [address] [place], [place], [address] Phone: [phone]   Fax:  [phone]   Name: Angela Nicholson MRN: [id number] Date of Birth: 1971-05-13

## 2017-09-03 ENCOUNTER — Ambulatory Visit: Payer: Medicare Other | Admitting: Speech Pathology

## 2017-09-03 ENCOUNTER — Ambulatory Visit: Payer: Medicare Other | Admitting: Rehabilitative and Restorative Service Providers"

## 2017-09-03 DIAGNOSIS — M6281 Muscle weakness (generalized): Secondary | ICD-10-CM | POA: Diagnosis not present

## 2017-09-03 DIAGNOSIS — R260 Ataxic gait: Secondary | ICD-10-CM

## 2017-09-03 DIAGNOSIS — R1312 Dysphagia, oropharyngeal phase: Secondary | ICD-10-CM

## 2017-09-03 DIAGNOSIS — R2681 Unsteadiness on feet: Secondary | ICD-10-CM

## 2017-09-03 NOTE — Therapy (Signed)
[facility] [address] [place], [place], [address] Phone: [phone]   Fax:  [phone]  Physical Therapy Treatment  Patient Details  Name: Angela Nicholson MRN: [id number] Date of Birth: 1970/11/28 Referring Provider: Kip A. Corrington, MD   Encounter Date: 09/03/2017  PT End of Session - 09/03/17 1502    Visit Number  18    Number of Visits  24    Date for PT Re-Evaluation  10/21/17    Authorization Type  Medicare now primary; visits added.      PT Start Time  1408    PT Stop Time  1448    PT Time Calculation (min)  40 min    Equipment Utilized During Treatment  Gait belt only during rocker board tasks    Activity Tolerance  Patient tolerated treatment well    Behavior During Therapy  WFL for tasks assessed/performed       Past Medical History:  Diagnosis Date  . Asthma   . Barrett esophagus   . Deaf   . GERD (gastroesophageal reflux disease)   . Schizo affective schizophrenia (HCC)     No past surgical history on file.  There were no vitals filed for this visit.  Subjective Assessment - 09/03/17 1412    Subjective  Patient is walking faster per her mother.  She feels she is getting used to her new glasses.     Patient is accompained by:  Family member;Interpreter    Patient Stated Goals  To get stronger and to eat with her mouth again.    Currently in Pain?  No/denies           [facility] Adult PT Treatment/Exercise - 09/03/17 1504      Ambulation/Gait   Ambulation/Gait  Yes    Ambulation/Gait Assistance  5: Supervision;4: Min guard    Ambulation/Gait Assistance Details  Community gait activities performing negotiation of grass, rubber multch, pinestraw and paved surfaces.  Performed curbs x 3 reps.  The patient begins to reach for therapist's arm while in parking lot noting fear of unlevel surfaces.  She declined walking over grassy hills and requested to stay on more level grassy surface.  PT  encouraged dec'd UE support during gait.  The patient walked on outdoor surfaces x 10 minutes-- with one rest break at 4.5 minutes and 2 instances of signing "I need to rest", then continuing to walk.      Assistive device  None    Ambulation Surface  Level;Unlevel;Indoor;Paved;Outdoor;Grass    Stairs  Yes    Stairs Assistance  6: Modified independent (Device/Increase time)    Stair Management Technique  Two rails    Number of Stairs  8    Height of Stairs  6    Curb  5: Supervision    Curb Details (indicate cue type and reason)  On outdoor surfaces, the patient initially reached for PT--encouraged less holding of therapist's arm.  Worked indoors on [employer]or with more distant supervision.      Self-Care   Self-Care  Other Self-Care Comments    Other Self-Care Comments   PT, patient and mother discussed PT plan currently established thru 10/21/17.  Discussed overall therapy goal of being able to transition to community activities to maintain strength gained in physical therapy.  Patient discussed that she wanted to continue to come to our clinic for PT.  We discussed that therapy is here when needed, but overall, our goal is to improve function and  then help her find a way to maintain progress.      Neuro Re-ed    Neuro Re-ed Details   Standing zoom ball with CGA. working on postural stengthening and balance.  Rocker board adding head motion, UE alternating movements and eyes closed.      Exercises   Exercises  Other Exercises    Other Exercises   Step ups x 10 reps R and L sides dec'ing reliance on UEs.               PT Education - 09/03/17 1501    Education provided  Yes    Education Details  patient's mother discussed d/c plans for therapy and inquired about potential for continuing.  PT discussed transitioning to community exercises at [facility] and patient's mother notes she is working with [facility] to discuss interpreter services for [facility] visits.    Person(s) Educated   Patient;Parent(s)    Methods  Explanation    Comprehension  Verbalized understanding       PT Short Term Goals - 08/23/17 1432      PT SHORT TERM GOAL #1   Title  = LTG        PT Long Term Goals - 08/23/17 1433      PT LONG TERM GOAL #1   Title  Pt will participate in HEP, walking program and community wellness at [facility] 3-4/7 days per week with mother    Time  8    Period  Weeks    Status  Revised    Target Date  10/21/17      PT LONG TERM GOAL #3   Title  The patient will ambulate nonstop x 10 minutes in order to improve community ambulation.     Baseline  PROGRESSING:(2/1) pt ambulates 10 min with one rest break at 4 min 2/2 fatigue    Time  8    Period  Weeks    Status  Revised    Target Date  10/21/17      PT LONG TERM GOAL #4   Title  Patient will negotiate 8 stairs without use of rails and supervision    Time  8    Period  Weeks    Status  New    Target Date  10/21/17      PT LONG TERM GOAL #5   Title  The patient will negotiate community surfaces (parking lots, curb, grass) without UE support x 1000 ft for improved community negotiation.    Baseline  1,000 outside with one and then two person HHA for most of the distance    Time  8    Period  Weeks    Status  Revised    Target Date  10/21/17            Plan - 09/03/17 1510    Clinical Impression Statement  The patient continues with some fear on outdoor surfaces with reluctance to walk away from building.  Patient's mother reports this fear has been present x years.  PT encouraging continued mobility and discussed slowly adding distance to current walking program.  PT's role in recovery discussed today due to patient/family questions about long term plan.  Discussed current LTGs thru 10/21/17.  PT to continue working on endurance, balance, community gait and transition to community exercise plan.    PT Treatment/Interventions  ADLs/Self Care Home Management;Gait training;Stair training;Functional mobility  training;Therapeutic activities;Therapeutic exercise;Balance training;Neuromuscular re-education;Patient/family education;Energy conservation    PT Next Visit Plan  endurance, gait outside with visual scanning, balance on compliant surfaces, stair negotiation alternating sequence    Consulted and Agree with Plan of Care  Patient;Family member/caregiver    Family Member Consulted  Mother       Patient will benefit from skilled therapeutic intervention in order to improve the following deficits and impairments:  Impaired flexibility, Decreased balance, Decreased activity tolerance, Decreased endurance, Decreased strength, Postural dysfunction, Abnormal gait  Visit Diagnosis: Muscle weakness (generalized)  Unsteadiness on feet  Ataxic gait     Problem List Patient Active Problem List   Diagnosis Date Noted  . Asthma with status asthmaticus 07/13/2013  . Hypothyroidism 07/13/2013  . Cerebral palsy (HCC) 07/13/2013  . Schizo affective schizophrenia (HCC) 07/13/2013  . 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 Anxiety 07/13/2013  . Status asthmaticus 07/13/2013    Devarion Mcclanahan, PT 09/03/2017, 3:13 PM  [facility] [address] [place], [place], [address] Phone: [phone]   Fax:  [phone]  Name: Susy ManorVeronica Hodges MRN: [id number] Date of Birth: 03/03/1971

## 2017-09-03 NOTE — Patient Instructions (Signed)
Adjustments to your head lift exercise:  Shaker Exercise - head lift - Lie flat on your back in your bed or on a couch; you can put a pillow under your knees - Raise your head and look at your feet - KEEP YOUR SHOULDERS DOWN - HOLD FOR 30 SECONDS, then lower your head back down -You can rest for 30 seconds in between - Repeat 3 times, 2-3 times a day -Afterwards, lift your head and hold for 1 second, then lower. Do as many quick pulses as you can (try for 10-30)   Continue your other exercises as instructed. Continue brushing your teeth, at least 3x per day AND before you have anything like ice or water

## 2017-09-03 NOTE — Therapy (Signed)
[facility] [address] [place], [place], [address] Phone: [phone]   Fax:  [phone]  Speech Language Pathology Treatment  Patient Details  Name: Angela Nicholson MRN: [id number] Date of Birth: 11/29/1970 Referring Provider: Candie ChromanKorrington, Kip, MD   Encounter Date: 09/03/2017  End of Session - 09/03/17 1741    Visit Number  3    Number of Visits  17    Date for SLP Re-Evaluation  10/23/17    SLP Start Time  1327    SLP Stop Time   1405    SLP Time Calculation (min)  38 min    Activity Tolerance  Patient tolerated treatment well       Past Medical History:  Diagnosis Date  . Asthma   . Barrett esophagus   . Deaf   . GERD (gastroesophageal reflux disease)   . Schizo affective schizophrenia (HCC)     No past surgical history on file.  There were no vitals filed for this visit.  Subjective Assessment - 09/03/17 1331    Subjective  Pt tells SLP she is having trouble with aspiration    Patient is accompained by:  Family member;Interpreter    Currently in Pain?  No/denies            ADULT SLP TREATMENT - 09/03/17 1331      General Information   Behavior/Cognition  Alert;Cooperative;Pleasant mood    Patient Positioning  Upright in chair      Treatment Provided   Treatment provided  Dysphagia      Dysphagia Treatment   Patient observed directly with PO's  Yes    Type of PO's observed  -- ice chips    Feeding  Able to feed self    Liquids provided via  Teaspoon    Pharyngeal Phase Signs & Symptoms  Immediate throat clear    Type of cueing  Verbal;Tactile;Visual    Amount of cueing  Moderate    Other treatment/comments  Pt  reports she has been completing exercises at home, dipping a teaspoon in water. She has increased her frequency of oral care and reports performing prior to this appointment. Pt demo'd effortful swallow with rare min A. She completed 2x 5 repetitions with 3 small ice chips  used to moisten oral cavity. SLP instructed pt to increase repetitions/frequency as able, with aim to work up to 25 repetitions without significant rest period. Pt reports she has been palpating her swallow and is able to discern up and down movement of her voice box. Attempted training in [facility] however she remains unable to complete even with max A. SLP took pt to therapy mat for training in Shaker exercise. Pt able to complete 3x 30 second holds with occasional min-mod A to keep shoulders down. Added 1 second head lifts after long holds; pt completed 2 sets of 5 prior to fatiguing. SLP provided updated instructions for Shaker exercise (3x 30 second holds or up to 45 seconds if able, then add 1 second holds (start with 10 repetitions, add up to 30 as able). Advised oral care a minimum of 3x per day and prior to swallowing exercises if using spoon dipped in water or ice chips.        Pain Assessment   Pain Assessment  No/denies pain      Assessment / Recommendations / Plan   Plan  Continue with current plan of care      Dysphagia Recommendations   Diet recommendations  NPO ice chips after thorough oral care    Medication Administration  Via alternative means      Progression Toward Goals   Progression toward goals  Progressing toward goals       SLP Education - 09/03/17 1741    Education provided  Yes    Education Details  adjustments to Shaker exercise    Person(s) Educated  Patient;Parent(s)    Methods  Explanation;Demonstration;Tactile cues;Verbal cues;Handout    Comprehension  Verbalized understanding       SLP Short Term Goals - 09/03/17 1742      SLP SHORT TERM GOAL #1   Title  pt will demo swallowing HEP with occasional min A over three sessions    Time  3    Period  Weeks    Status  On-going      SLP SHORT TERM GOAL #2   Title  pt will tell SLP 3 overt s/s aspiration PNA with rare min A over two sessions    Time  3    Period  Weeks    Status  On-going        SLP Long Term Goals - 09/03/17 1743      SLP LONG TERM GOAL #1   Title  pt will complete HEP at least 6/7 days/week, reported by mother and/or pt    Time  7    Period  Weeks    Status  On-going      SLP LONG TERM GOAL #2   Title  pt will complete swallowing HEP with rare min A    Time  7    Period  Weeks    Status  On-going      SLP LONG TERM GOAL #3   Title  pt will tell SLP 3 overt s/s aspiration PNA in 3 therapy sessions    Time  7    Period  Weeks    Status  On-going       Plan - 09/03/17 1742    Clinical Impression Statement  Pt requires min to max A for HEP for dysphagia - continue skilled ST for possible, eventual pleasure feeds safely    Speech Therapy Frequency  2x / week    Treatment/Interventions  Aspiration precaution training;Pharyngeal strengthening exercises;Internal/external aids;Multimodal communcation approach;Patient/family education;Compensatory strategies;SLP instruction and feedback    Potential to Achieve Goals  Fair    Potential Considerations  Severity of impairments;Previous level of function    Consulted and Agree with Plan of Care  Patient;Family member/caregiver    Family Member Consulted  mom       Patient will benefit from skilled therapeutic intervention in order to improve the following deficits and impairments:   Oropharyngeal dysphagia    Problem List Patient Active Problem List   Diagnosis Date Noted  . Asthma with status asthmaticus 07/13/2013  . Hypothyroidism 07/13/2013  . Cerebral palsy (HCC) 07/13/2013  . Schizo affective schizophrenia (HCC) 07/13/2013  . 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 Anxiety 07/13/2013  . Status asthmaticus 07/13/2013   Rondel Baton, MS, CCC-SLP Speech-Language Pathologist  Arlana Lindau 09/03/2017, 5:44 PM  [facility] [address] [place], [place], [address] Phone: [phone]   Fax:  [phone]   Name: Angela Nicholson MRN: [id number] Date of Birth: 06/09/1971

## 2017-09-04 ENCOUNTER — Ambulatory Visit: Payer: Medicare Other

## 2017-09-04 DIAGNOSIS — R1312 Dysphagia, oropharyngeal phase: Secondary | ICD-10-CM

## 2017-09-04 DIAGNOSIS — M6281 Muscle weakness (generalized): Secondary | ICD-10-CM | POA: Diagnosis not present

## 2017-09-04 NOTE — Patient Instructions (Signed)
Continue with your exercises. Use ice chips when you need to moisten your mouth, except on the hard swallow exercise. Then use a drop or two of water.

## 2017-09-04 NOTE — Therapy (Signed)
[facility] [address] [place], [place], [address] Phone: [phone]   Fax:  [phone]  Speech Language Pathology Treatment  Patient Details  Name: Angela Nicholson MRN: [id number] Date of Birth: [birth year] Referring Provider: Candie Chroman, MD   Encounter Date: 09/04/2017  End of Session - 09/04/17 1317    Visit Number  4    Number of Visits  17    Date for SLP Re-Evaluation  10/23/17    SLP Start Time  1150    SLP Stop Time   1230    SLP Time Calculation (min)  40 min    Activity Tolerance  Patient tolerated treatment well       Past Medical History:  Diagnosis Date  . Asthma   . Barrett esophagus   . Deaf   . GERD (gastroesophageal reflux disease)   . Schizo affective schizophrenia (HCC)     History reviewed. No pertinent surgical history.  There were no vitals filed for this visit.  Subjective Assessment - 09/03/17 1331    Subjective  Pt tells SLP she is having trouble with aspiration    Patient is accompained by:  Family member;Interpreter    Currently in Pain?  No/denies            ADULT SLP TREATMENT - 09/04/17 1158      General Information   Behavior/Cognition  Alert;Cooperative;Pleasant mood      Treatment Provided   Treatment provided  Dysphagia      Dysphagia Treatment   Temperature Spikes Noted  No    Patient observed directly with PO's  Yes    Type of PO's observed  Ice chips    Other treatment/comments  Sign interpreter Angela Nicholson here with pt/mother. SLP facilitated strong exhale from pt to bridge to possible use of EMT, for approx 10 minutes. Pt success approx 40% with consistent cueing for full breath and stronger blowing. SLP most salient cue was for pt to "blow out your birthday candles." Max A necessary for [name] even despite SLP breaking down individual steps. Pt mother had pt palpate her swallow - pt with success 1/5. "I will keep practicing," pt stated via  interpreter. SLP  reiterated pt should complete oral care x3/day and upon pt question why, explained rationale. Pt remarked she was going to dentist next week so she will have her plaque cleaned. SLP again stated that TID oral cleaning was not related to how much plaque pt has, but to pulmonary health due to danger of aspiration PNA from bacteria, etc aspirated into lungs.       Assessment / Recommendations / Plan   Plan  Continue with current plan of care      Progression Toward Goals   Progression toward goals  Progressing toward goals       SLP Education - 09/04/17 1317    Education provided  Yes    Education Details  rationale for oral care TID, use ice chips for moistening mouth (except for effortful swallow)    Person(s) Educated  Patient;Parent(s)    Methods  Explanation    Comprehension  Verbalized understanding;Returned demonstration       SLP Short Term Goals - 09/04/17 1318      SLP SHORT TERM GOAL #1   Title  pt will demo swallowing HEP with occasional min A over three sessions    Time  3    Period  Weeks    Status  On-going  SLP SHORT TERM GOAL #2   Title  pt will tell SLP 3 overt s/s aspiration PNA with rare min A over two sessions    Time  3    Period  Weeks    Status  On-going       SLP Long Term Goals - 09/04/17 1318      SLP LONG TERM GOAL #1   Title  pt will complete HEP at least 6/7 days/week, reported by mother and/or pt    Time  7    Period  Weeks    Status  On-going      SLP LONG TERM GOAL #2   Title  pt will complete swallowing HEP with rare min A    Time  7    Period  Weeks    Status  On-going      SLP LONG TERM GOAL #3   Title  pt will tell SLP 3 overt s/s aspiration PNA in 3 therapy sessions    Time  7    Period  Weeks    Status  On-going       Plan - 09/04/17 1317    Clinical Impression Statement  Pt requires min to max A for HEP for dysphagia - continue skilled ST for possible, eventual pleasure feeds safely. See "skilled  interventions" for more details.    Speech Therapy Frequency  2x / week    Treatment/Interventions  Aspiration precaution training;Pharyngeal strengthening exercises;Internal/external aids;Multimodal communcation approach;Patient/family education;Compensatory strategies;SLP instruction and feedback    Potential to Achieve Goals  Fair    Potential Considerations  Severity of impairments;Previous level of function    Consulted and Agree with Plan of Care  Patient;Family member/caregiver    Family Member Consulted  mom       Patient will benefit from skilled therapeutic intervention in order to improve the following deficits and impairments:   Oropharyngeal dysphagia    Problem List Patient Active Problem List   Diagnosis Date Noted  . Asthma with status asthmaticus 07/13/2013  . Hypothyroidism 07/13/2013  . Cerebral palsy (HCC) 07/13/2013  . Schizo affective schizophrenia (HCC) 07/13/2013  . 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 Anxiety 07/13/2013  . Status asthmaticus 07/13/2013    [facility],CARL ,MS, CCC-SLP  09/04/2017, 1:18 PM  [facility] [address] [place], [place], [address] Phone: [phone]   Fax:  [phone]   Name: Angela Nicholson MRN: [id number] Date of Birth: 10/01/1970

## 2017-09-07 ENCOUNTER — Encounter: Payer: Self-pay | Admitting: Rehabilitative and Restorative Service Providers"

## 2017-09-07 ENCOUNTER — Ambulatory Visit: Payer: Medicare Other | Admitting: Speech Pathology

## 2017-09-07 ENCOUNTER — Ambulatory Visit: Payer: Medicare Other | Admitting: Rehabilitative and Restorative Service Providers"

## 2017-09-07 DIAGNOSIS — R1312 Dysphagia, oropharyngeal phase: Secondary | ICD-10-CM

## 2017-09-07 DIAGNOSIS — M6281 Muscle weakness (generalized): Secondary | ICD-10-CM | POA: Diagnosis not present

## 2017-09-07 DIAGNOSIS — R2681 Unsteadiness on feet: Secondary | ICD-10-CM

## 2017-09-07 NOTE — Patient Instructions (Signed)
Side Stretch - IN SITTING    In SITTING PLACE A PILLOW BESIDE YOUR RIGHT SIDE: while bending directly to  RIGHT side OVER PILLOW - DOWN TO ELBOW Keeping LEFT ARM UP AND REACH OVER HEAD.  HOLD 4-5 SECONDS.  REPEAT 2-3 TIMES  Side Waist Stretch from Child's Pose    From child's pose, walk hands to left. Reach right hand out on diagonal. Reach hips back toward heels making a C with torso. Breathe into right side waist. Hold for ___2_ breaths. Repeat _2___ times each side.  Bridge    Lying on back, legs bent 90, feet flat on floor. Press up hips and torso, PRESSING ARMS DOWN IN THE FLOOR, SQUEEZE SHOULDER BLADES TOGETHER Hold for _2___ breaths.  REPEAT 10 TIMES Functional Quadriceps: Sit to Stand    Scoot forward so you are sitting on edge of chair, feet flat on floor. Stand upright, extending knees fully. Then SLOWLY sit back down (count to 7 while going to sit down) Repeat __10__ times per set. Do __3__ sets per session. Do __3__ sessions per week.  http://orth.exer.us/735   Copyright  VHI. All rights reserved.   Functional Quadriceps: Chair Squat    Keeping feet flat on floor, shoulder width apart, squat as low as is comfortable. Use support as necessary. Repeat _10___ times per set. Do __2__ sets per session. Do __3__ sessions per week.  http://orth.exer.us/737   Copyright  VHI. All rights reserved.   Up / Down Head Motion    Perform at counter with hand support. Walking on solid surface, move head and eyes toward ceiling for _2___ steps. Then, move head and eyes toward floor for __2__ steps. Repeat __4__ times per session. Do __1__ sessions per day.  Copyright  VHI. All rights reserved.  Side to Side Head Motion    Perform at counter with hand support. Walking on solid surface, turn head and eyes to left for __2__ steps. Then, turn head and eyes to opposite side for __2__ steps. Repeat sequence __4__ times per session. Do __1__ sessions per  day.   Copyright  VHI. All rights reserved.      Neck Side-Bending   We are going to do this motion lying down.  Rest on your right side with a towel roll under the right side of your neck.  Relax for 2-3 minutes as tolerated.  Do 1 time/day or more as needed for neck stretching.  Hamstring Stretch    Sitting with operated leg straight on bed, and foot of other leg on floor, lean forward toward toes of straight leg. Hold __30__ seconds. Repeat __2__ times on each leg. Do __1-2__ sessions per day.  http://gt2.exer.us/303   Copyright  VHI. All rights reserved.   Heel Cord Stretch    Place one leg forward, bent, other leg behind and straight. Lean forward keeping back heel flat. Hold __30__ seconds while counting out loud. Repeat with other leg. Repeat __2__ times on each leg. Do __1-2__ sessions per day.  http://gt2.exer.us/512   Copyright  VHI. All rights reserved.

## 2017-09-07 NOTE — Therapy (Signed)
[facility] [address] [place], [place], [address] Phone: [phone]   Fax:  [phone]  Speech Language Pathology Treatment  Patient Details  Name: Angela Nicholson MRN: [id number] Date of Birth: 12/20/1970 Referring Provider: Candie ChromanKorrington, Kip, MD   Encounter Date: 09/07/2017  End of Session - 09/07/17 1401    Visit Number  5    Number of Visits  17    Date for SLP Re-Evaluation  10/23/17    SLP Start Time  1145    SLP Stop Time   1225    SLP Time Calculation (min)  40 min    Activity Tolerance  Patient tolerated treatment well       Past Medical History:  Diagnosis Date  . Asthma   . Barrett esophagus   . Deaf   . GERD (gastroesophageal reflux disease)   . Schizo affective schizophrenia (HCC)     No past surgical history on file.  There were no vitals filed for this visit.         ADULT SLP TREATMENT - 09/07/17 1146      General Information   Behavior/Cognition  Alert;Cooperative;Pleasant mood    Patient Positioning  Upright in chair    Oral care provided  -- pt performed prior to session      Treatment Provided   Treatment provided  Dysphagia      Dysphagia Treatment   Temperature Spikes Noted  No    Respiratory Status  Room air    Oral Cavity - Dentition  Adequate natural dentition    Treatment Methods  Therapeutic exercise;Patient/caregiver education    Patient observed directly with PO's  Yes    Type of PO's observed  -- spoon dipped in H20    Liquids provided via  Teaspoon    Pharyngeal Phase Signs & Symptoms  Immediate throat clear    Type of cueing  Verbal;Tactile;Visual    Amount of cueing  Moderate    Other treatment/comments  Pt's mother had questions about e-stimulation for swallowing therapy; SLP educated re: mixed empirical evidence for use of e-stim however will consider if clinically appropriate. Educated re: purpose of swallowing exercises pt is currently completing are  designed to target areas of weakness identified on pt's MBS. Pt demo'd 19 repetitions of effortful swallow with rare min A for increased effort. Pt/mother report they are encouraged as pt has been able to increase her repetitions at home. SLP worked with pt on expiratory effort both with and without straw and use of a tissue for biofeedback. Pt required frequent min-mod A to slow her rate for maximum inhalation prior to blowing. SLP assessed pt's maximum inspiratory and expiratory pressures using manometer. Maximum inhalation is 16 cm H20, while maximum expiratory pressure measures 32 cm H20. Norms for age/gender are 38-79 cm H20 for inspiratory, 68-100 cm H20 for expiratory.       Pain Assessment   Pain Assessment  No/denies pain      Assessment / Recommendations / Plan   Plan  Continue with current plan of care      Dysphagia Recommendations   Diet recommendations  -- NPO       SLP Education - 09/07/17 1400    Education provided  Yes    Education Details  mixed on e-stim for dysphagia, however will consider if clinically indicated; focus on one exercise at a time vs combining PT exercise with Shaker    Person(s) Educated  Parent(s)    Methods  Explanation    Comprehension  Verbalized understanding       SLP Short Term Goals - 09/07/17 1401      SLP SHORT TERM GOAL #1   Title  pt will demo swallowing HEP with occasional min A over three sessions    Time  2    Period  Weeks    Status  On-going      SLP SHORT TERM GOAL #2   Title  pt will tell SLP 3 overt s/s aspiration PNA with rare min A over two sessions    Time  2    Period  Weeks    Status  On-going       SLP Long Term Goals - 09/07/17 1402      SLP LONG TERM GOAL #1   Title  pt will complete HEP at least 6/7 days/week, reported by mother and/or pt    Time  6    Period  Weeks    Status  On-going      SLP LONG TERM GOAL #2   Title  pt will complete swallowing HEP with rare min A    Time  6    Period  Weeks     Status  On-going      SLP LONG TERM GOAL #3   Title  pt will tell SLP 3 overt s/s aspiration PNA in 3 therapy sessions    Time  6    Period  Weeks    Status  On-going       Plan - 09/07/17 1401    Clinical Impression Statement  Pt requires min to max A for HEP for dysphagia - continue skilled ST for possible, eventual pleasure feeds safely. See "skilled interventions" for more details.    Speech Therapy Frequency  2x / week    Treatment/Interventions  Aspiration precaution training;Pharyngeal strengthening exercises;Internal/external aids;Multimodal communcation approach;Patient/family education;Compensatory strategies;SLP instruction and feedback    Potential to Achieve Goals  Fair    Potential Considerations  Severity of impairments;Previous level of function    Consulted and Agree with Plan of Care  Patient;Family member/caregiver    Family Member Consulted  mom       Patient will benefit from skilled therapeutic intervention in order to improve the following deficits and impairments:   Oropharyngeal dysphagia    Problem List Patient Active Problem List   Diagnosis Date Noted  . Asthma with status asthmaticus 07/13/2013  . Hypothyroidism 07/13/2013  . Cerebral palsy (HCC) 07/13/2013  . Schizo affective schizophrenia (HCC) 07/13/2013  . 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 Anxiety 07/13/2013  . Status asthmaticus 07/13/2013   Angela Baton, MS, CCC-SLP Speech-Language Pathologist  Angela Nicholson 09/07/2017, 2:03 PM  [facility] [address] [place], [place], [address] Phone: [phone]   Fax:  [phone]   Name: Angela Nicholson MRN: [id number] Date of Birth: [birth year]

## 2017-09-07 NOTE — Therapy (Signed)
[facility] [address] [place], [place], [address] Phone: [phone]   Fax:  [phone]  Physical Therapy Treatment  Patient Details  Name: Angela Nicholson MRN: [id number] Date of Birth: 02/07/1971 Referring Provider: Kip A. Corrington, MD   Encounter Date: 09/07/2017  PT End of Session - 09/07/17 1230    Visit Number  19    Number of Visits  24    Date for PT Re-Evaluation  10/21/17    Authorization Type  Medicare now primary; visits added.      PT Start Time  1230    PT Stop Time  1315    PT Time Calculation (min)  45 min    Equipment Utilized During Treatment  Gait belt only during rocker board tasks    Activity Tolerance  Patient tolerated treatment well    Behavior During Therapy  WFL for tasks assessed/performed       Past Medical History:  Diagnosis Date  . Asthma   . Barrett esophagus   . Deaf   . GERD (gastroesophageal reflux disease)   . Schizo affective schizophrenia (HCC)     History reviewed. No pertinent surgical history.  There were no vitals filed for this visit.  Subjective Assessment - 09/07/17 1231    Subjective  The patient brought her sneakers to show physical therapist other shoes.     Patient is accompained by:  Family member;Interpreter    Patient Stated Goals  To get stronger and to eat with her mouth again.    Currently in Pain?  No/denies                      [facility] Treatment/Exercise - 09/07/17 1237      Ambulation/Gait   Ambulation/Gait  Yes    Ambulation/Gait Assistance  6: Modified independent (Device/Increase time)    Ambulation/Gait Assistance Details  Patient declined going outside noting through interpreter "it needs to be a little warmer"    Ambulation Distance (Feet)  400 Feet    Assistive device  None    Ambulation Surface  Level;Indoor    Stairs  Yes    Stairs Assistance  6: Modified independent (Device/Increase time)    Stair  Management Technique  Two rails;Alternating pattern    Number of Stairs  8    Gait Comments  Gait activities performing marcing with ball overhead to engage core; marching on ramp for balance and core engagement.      Neuro Re-ed    Neuro Re-ed Details   Standing on compliant surfaces with head motion, eyes closed, and UE movements for proactive balance.  Performed rocker board and foam surfaces.    Side-stepping with ball overhead encouraging slower/more deliberate pace x 20 feet x 2 reps.  Reviewed walking with head motion for home exercise program.      Exercises   Exercises  Other Exercises    Other Exercises   Lateral step ups x 10 reps each side, alternating LE foot taps to 12" steps dec'ing UE support.   Seated hamstring stretch with cues.  Standing heel cord stretch with tactile cues, Reviewed prior HEP including bridges, sit<>stand, and deep squats.  REcommended patient continue--PT to work with her further on consolidating HEP for long term.             PT Education - 09/07/17 1328    Education provided  Yes    Education Details  Added 2 new HEP (hamstring  and heel cord stretch).  Provided printout including all prior HEP.    Person(s) Educated  Patient;Parent(s)    Methods  Explanation;Demonstration;Handout    Comprehension  Verbalized understanding;Returned demonstration       PT Short Term Goals - 08/23/17 1432      PT SHORT TERM GOAL #1   Title  = LTG        PT Long Term Goals - 08/23/17 1433      PT LONG TERM GOAL #1   Title  Pt will participate in HEP, walking program and community wellness at [facility] 3-4/7 days per week with mother    Time  8    Period  Weeks    Status  Revised    Target Date  10/21/17      PT LONG TERM GOAL #3   Title  The patient will ambulate nonstop x 10 minutes in order to improve community ambulation.     Baseline  PROGRESSING:(2/1) pt ambulates 10 min with one rest break at 4 min 2/2 fatigue    Time  8    Period  Weeks    Status   Revised    Target Date  10/21/17      PT LONG TERM GOAL #4   Title  Patient will negotiate 8 stairs without use of rails and supervision    Time  8    Period  Weeks    Status  New    Target Date  10/21/17      PT LONG TERM GOAL #5   Title  The patient will negotiate community surfaces (parking lots, curb, grass) without UE support x 1000 ft for improved community negotiation.    Baseline  1,000 outside with one and then two person HHA for most of the distance    Time  8    Period  Weeks    Status  Revised    Target Date  10/21/17            Plan - 09/07/17 1333    Clinical Impression Statement  The patient fatigues in therapy with LE strengthening.  She needs more work on deep squat and sit<>stand exercise with cues on maintaining weight anteriorly to control descent into chair.  PT to continue working to [employer].     PT Treatment/Interventions  ADLs/Self Care Home Management;Gait training;Stair training;Functional mobility training;Therapeutic activities;Therapeutic exercise;Balance training;Neuromuscular re-education;Patient/family education;Energy conservation    PT Next Visit Plan  *INQUIRED ABOUT an exercise at home that she does lying supine and raising head off of floor--wants to review, endurance, gait outside with visual scanning, balance on compliant surfaces, stair negotiation alternating sequence    Consulted and Agree with Plan of Care  Patient;Family member/caregiver    Family Member Consulted  Mother       Patient will benefit from skilled therapeutic intervention in order to improve the following deficits and impairments:  Impaired flexibility, Decreased balance, Decreased activity tolerance, Decreased endurance, Decreased strength, Postural dysfunction, Abnormal gait  Visit Diagnosis: Muscle weakness (generalized)  Unsteadiness on feet     Problem List Patient Active Problem List   Diagnosis Date Noted  . Asthma with status asthmaticus 07/13/2013  .  Hypothyroidism 07/13/2013  . Cerebral palsy (HCC) 07/13/2013  . Schizo affective schizophrenia (HCC) 07/13/2013  . 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 Anxiety 07/13/2013  . Status asthmaticus 07/13/2013    Angela Nicholson, PT 09/07/2017,  1:37 PM  [facility] [address] [place], [place], [address] Phone: [phone]   Fax:  [phone]  Name: Angela Nicholson MRN: [id number] Date of Birth: 08/03/1970

## 2017-09-09 ENCOUNTER — Ambulatory Visit: Payer: Medicare Other

## 2017-09-09 DIAGNOSIS — R1312 Dysphagia, oropharyngeal phase: Secondary | ICD-10-CM

## 2017-09-09 DIAGNOSIS — M6281 Muscle weakness (generalized): Secondary | ICD-10-CM | POA: Diagnosis not present

## 2017-09-09 NOTE — Therapy (Signed)
[facility] [address] [place], [place], [address] Phone: [phone]   Fax:  [phone]  Speech Language Pathology Treatment  Patient Details  Name: Angela Nicholson MRN: [id number] Date of Birth: 09-Nov-1970 Referring Provider: Candie Chroman, MD   Encounter Date: 09/09/2017  End of Session - 09/09/17 1021    Visit Number  6    Number of Visits  17    Date for SLP Re-Evaluation  10/23/17    SLP Start Time  0850    SLP Stop Time   0930    SLP Time Calculation (min)  40 min    Activity Tolerance  Patient tolerated treatment well       Past Medical History:  Diagnosis Date  . Asthma   . Barrett esophagus   . Deaf   . GERD (gastroesophageal reflux disease)   . Schizo affective schizophrenia (HCC)     History reviewed. No pertinent surgical history.  There were no vitals filed for this visit.  Subjective Assessment - 09/09/17 0852    Subjective  "I'm feeling stronger."    Patient is accompained by:  -- mom and TWO interpreters    Currently in Pain?  No/denies            ADULT SLP TREATMENT - 09/09/17 0853      General Information   Behavior/Cognition  Alert;Cooperative;Pleasant mood      Treatment Provided   Treatment provided  Dysphagia      Dysphagia Treatment   Temperature Spikes Noted  No    Respiratory Status  Room air    Oral Cavity - Dentition  Adequate natural dentition    Treatment Methods  Therapeutic exercise;Patient/caregiver education    Patient observed directly with PO's  Yes    Type of PO's observed  -- drops H2O    Liquids provided via  Teaspoon "wet spoon"    Other treatment/comments  Pt completed HEP with good (not excellent) success re: procedure, except for Mendelsohn, which SLP educated pt and mother for 10 minutes, and pt still unable to definitively demonstrate it correctly. Will cont to educate and encourage pt to complete at home. Pt also with difficulty with  tongue protrusion with Masako, so SLP modified it to have her press tongue to back of top teeth and swallow.       Assessment / Recommendations / Plan   Plan  Continue with current plan of care      Progression Toward Goals   Progression toward goals  Progressing toward goals       SLP Education - 09/09/17 1020    Education provided  Yes    Education Details  Mendelsohn procedure, Masako procedure modification    Person(s) Educated  Patient;Parent(s)    Methods  Explanation;Demonstration;Verbal cues;Tactile cues    Comprehension  Verbalized understanding;Need further instruction;Verbal cues required;Tactile cues required       SLP Short Term Goals - 09/09/17 1022      SLP SHORT TERM GOAL #1   Title  pt will demo swallowing HEP with occasional min A over three sessions    Time  2    Period  Weeks    Status  On-going      SLP SHORT TERM GOAL #2   Title  pt will tell SLP 3 overt s/s aspiration PNA with rare min A over two sessions    Time  2    Period  Weeks    Status  On-going  SLP Long Term Goals - 09/09/17 1022      SLP LONG TERM GOAL #1   Title  pt will complete HEP at least 6/7 days/week, reported by mother and/or pt    Time  6    Period  Weeks    Status  On-going      SLP LONG TERM GOAL #2   Title  pt will complete swallowing HEP with rare min A    Time  6    Period  Weeks    Status  On-going      SLP LONG TERM GOAL #3   Title  pt will tell SLP 3 overt s/s aspiration PNA in 3 therapy sessions    Time  6    Period  Weeks    Status  On-going       Plan - 09/09/17 1021    Clinical Impression Statement  Pt requires cont'd A for HEP for dysphagia specifically for Mendelsohn and Masako- continue skilled ST for possible, eventual pleasure feeds safely. See "skilled interventions" for more details.    Speech Therapy Frequency  2x / week    Treatment/Interventions  Aspiration precaution training;Pharyngeal strengthening exercises;Internal/external  aids;Multimodal communcation approach;Patient/family education;Compensatory strategies;SLP instruction and feedback    Potential to Achieve Goals  Fair    Potential Considerations  Severity of impairments;Previous level of function    Consulted and Agree with Plan of Care  Patient;Family member/caregiver    Family Member Consulted  mom       Patient will benefit from skilled therapeutic intervention in order to improve the following deficits and impairments:   Oropharyngeal dysphagia    Problem List Patient Active Problem List   Diagnosis Date Noted  . Asthma with status asthmaticus 07/13/2013  . Hypothyroidism 07/13/2013  . Cerebral palsy (HCC) 07/13/2013  . Schizo affective schizophrenia (HCC) 07/13/2013  . 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 Anxiety 07/13/2013  . Status asthmaticus 07/13/2013    [facility],Lathon Adan ,MS, CCC-SLP  09/09/2017, 10:22 AM  [facility] [address] [place], [place], [address] Phone: [phone]   Fax:  [phone]   Name: Angela Nicholson MRN: [id number] Date of Birth: 11/20/1970

## 2017-09-14 ENCOUNTER — Ambulatory Visit: Payer: Medicare Other | Admitting: Physical Therapy

## 2017-09-14 ENCOUNTER — Ambulatory Visit: Payer: Medicare Other

## 2017-09-14 DIAGNOSIS — M6281 Muscle weakness (generalized): Secondary | ICD-10-CM | POA: Diagnosis not present

## 2017-09-14 DIAGNOSIS — R1312 Dysphagia, oropharyngeal phase: Secondary | ICD-10-CM

## 2017-09-14 NOTE — Patient Instructions (Signed)
Blue=Blow  5 times, 5 sets (30 seconds rest between sets)  5 times, 5 sets of the clear device (30 seconds rest between sets)  Make sure to blow all the air out of your lungs and then suck in HARD for the clear device  Get as much air into your lungs as you can and then BLOW out HARD for the blue device

## 2017-09-14 NOTE — Therapy (Signed)
[facility] [address] [place], [place], [address] Phone: [phone]   Fax:  [phone]  Speech Language Pathology Treatment  Patient Details  Name: Angela Nicholson MRN: [id number] Date of Birth: 08/30/1970 Referring Provider: Candie ChromanKorrington, Kip, MD   Encounter Date: 09/14/2017    Past Medical History:  Diagnosis Date  . Asthma   . Barrett esophagus   . Deaf   . GERD (gastroesophageal reflux disease)   . Schizo affective schizophrenia (HCC)     History reviewed. No pertinent surgical history.  There were no vitals filed for this visit.  Subjective Assessment - 09/14/17 1155    Subjective  "The 60-60-60 is going better" (Shaker)    Patient is accompained by:  Family member;Interpreter mom    Currently in Pain?  No/denies            ADULT SLP TREATMENT - 09/14/17 1216      General Information   Behavior/Cognition  Alert;Cooperative;Pleasant mood      Treatment Provided   Treatment provided  Dysphagia      Dysphagia Treatment   Temperature Spikes Noted  No    Respiratory Status  Room air    Oral Cavity - Dentition  Adequate natural dentition    Treatment Methods  Therapeutic exercise;Patient/caregiver education    Patient observed directly with PO's  No    Other treatment/comments  SLP trained pt/mother wiht expiratory muscle and inspiratory muscle trainers. Mod cues for both primarily due to extra steps in communication with interpreter, but pt eventually return demonstrated correctly. SLP cued mother that she may need to assist with pt for either full inhalation or full exhalation prior to performing the corresponding exercise. Told pt 5 reps x5 sets daily. SLP then cont'd to work with pt on [place] with limited success, so SLP focused on pt tactile cues of thyroid movement, thinking that once she understands this she could be cued to hold at the apex of thyroid movement. Lingual gauze hold was  also practiced with the pt telling SLP she was completing the hold improperly. SLP encouraged mother to assist pt PRN.      Assessment / Recommendations / Plan   Plan  Continue with current plan of care      Progression Toward Goals   Progression toward goals  Progressing toward goals       SLP Education - 09/14/17 1316    Education provided  Yes    Education Details  respiratory muscle trainers HEP, mendelsohn, lingual gauze hold    Person(s) Educated  Patient;Parent(s)    Methods  Explanation;Demonstration;Verbal cues;Tactile cues    Comprehension  Verbalized understanding;Need further instruction;Returned demonstration;Verbal cues required;Tactile cues required       SLP Short Term Goals - 09/14/17 1317      SLP SHORT TERM GOAL #1   Title  pt will demo swallowing HEP with occasional min A over three sessions    Time  1    Period  Weeks    Status  On-going      SLP SHORT TERM GOAL #2   Title  pt will tell SLP 3 overt s/s aspiration PNA with rare min A over two sessions    Time  1    Period  Weeks    Status  On-going       SLP Long Term Goals - 09/14/17 1318      SLP LONG TERM GOAL #1   Title  pt will complete HEP at  least 6/7 days/week, reported by mother and/or pt    Time  5    Period  Weeks    Status  On-going      SLP LONG TERM GOAL #2   Title  pt will complete swallowing HEP with rare min A    Time  5    Period  Weeks    Status  On-going      SLP LONG TERM GOAL #3   Title  pt will tell SLP 3 overt s/s aspiration PNA in 3 therapy sessions    Time  5    Period  Weeks    Status  On-going       Plan - 09/14/17 1317    Clinical Impression Statement  Pt requires cont'd A for HEP for dysphagia specifically for Mendelsohn and Masako- continue skilled ST for possible, eventual pleasure feeds safely. See "skilled interventions" for more details.    Speech Therapy Frequency  2x / week    Duration  -- 8 weeks/17 total visits    Treatment/Interventions   Aspiration precaution training;Pharyngeal strengthening exercises;Internal/external aids;Multimodal communcation approach;Patient/family education;Compensatory strategies;SLP instruction and feedback    Potential to Achieve Goals  Fair    Potential Considerations  Severity of impairments;Previous level of function       Patient will benefit from skilled therapeutic intervention in order to improve the following deficits and impairments:   Oropharyngeal dysphagia    Problem List Patient Active Problem List   Diagnosis Date Noted  . Asthma with status asthmaticus 07/13/2013  . Hypothyroidism 07/13/2013  . Cerebral palsy (HCC) 07/13/2013  . Schizo affective schizophrenia (HCC) 07/13/2013  . 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 Anxiety 07/13/2013  . Status asthmaticus 07/13/2013    [facility] ,MS, CCC-SLP  09/14/2017, 1:18 PM  [facility] [address] [place], [place], [address] Phone: [phone]   Fax:  [phone]   Name: Angela Nicholson MRN: [id number] Date of Birth: 06/25/1971

## 2017-09-18 ENCOUNTER — Ambulatory Visit: Payer: Medicare Other | Admitting: Physical Therapy

## 2017-09-18 ENCOUNTER — Ambulatory Visit: Payer: Medicare Other | Attending: Family Medicine

## 2017-09-18 DIAGNOSIS — G804 Ataxic cerebral palsy: Secondary | ICD-10-CM | POA: Insufficient documentation

## 2017-09-18 DIAGNOSIS — R2681 Unsteadiness on feet: Secondary | ICD-10-CM | POA: Insufficient documentation

## 2017-09-18 DIAGNOSIS — R260 Ataxic gait: Secondary | ICD-10-CM | POA: Diagnosis present

## 2017-09-18 DIAGNOSIS — M6281 Muscle weakness (generalized): Secondary | ICD-10-CM | POA: Insufficient documentation

## 2017-09-18 DIAGNOSIS — R1312 Dysphagia, oropharyngeal phase: Secondary | ICD-10-CM | POA: Diagnosis not present

## 2017-09-18 NOTE — Therapy (Signed)
[facility] [address] [place], [place], [address] Phone: [phone]   Fax:  [phone]  Speech Language Pathology Treatment  Patient Details  Name: Angela Nicholson MRN: [id number] Date of Birth: 1970/07/26 Referring Provider: Kate Sable, MD   Encounter Date: 09/18/2017  End of Session - 09/18/17 1717    Visit Number  7    Number of Visits  17    Date for SLP Re-Evaluation  10/23/17    SLP Start Time  1533    SLP Stop Time   1615    SLP Time Calculation (min)  42 min    Activity Tolerance  Patient tolerated treatment well       Past Medical History:  Diagnosis Date  . Asthma   . Barrett esophagus   . Deaf   . GERD (gastroesophageal reflux disease)   . Schizo affective schizophrenia ([place])     History reviewed. No pertinent surgical history.  There were no vitals filed for this visit.  Subjective Assessment - 09/18/17 1539    Subjective  Pt enters with             ADULT SLP TREATMENT - 09/18/17 1707      General Information   Behavior/Cognition  Alert;Cooperative;Pleasant mood      Dysphagia Treatment   Temperature Spikes Noted  No    Respiratory Status  Room air    Oral Cavity - Dentition  Adequate natural dentition    Treatment Methods  Therapeutic exercise;Patient/caregiver education    Patient observed directly with PO's  No    Type of cueing  Verbal;Tactile;Visual    Amount of cueing  Maximal    Other treatment/comments  SLP worked with pt in trying to achieve full breath to work with EMT, however pt appeared to use shallow breaths with EMT and also with practicing strong exhale. SLP attempted to explain concept of full lungs with plastic bag and her lungs with 1/2 full of air plastic bag. Despite multiple attempts pt cont'd to display only minimal air intake with inhalation resulting in minmal volume of air exhaled. Pt also worked with pt with gauze/tongue hold exercise, as pt was  holding protruded tongue with gauze but was pulling tongue into her mouth and then protruding again. SLP used max cues and explanations and after approx 8 minutes pt correctly demonstrated exercise. What is puzzling to SLP is that pt performed tihs exercise correctly approx 2-3 sessions ago and now completes it incorrectly. SLP ensured mother knew what exercise should look like, and could explain it to SLP sucessfully.  Pt to ONLY work on EMT this weekend, in order to simplfy and narrow down instruction for pt.      Assessment / Recommendations / Plan   Plan  Continue with current plan of care      Progression Toward Goals   Progression toward goals  Not progressing toward goals (comment) pt demos misunderstandings about procedure of HEP       SLP Education - 09/18/17 1715    Education provided  Yes    Education Details  inhalation to full lung capacity, blow hard and fast with EMT, HEP (tongue/gauze exercise)    Person(s) Educated  Patient;Parent(s)    Methods  Explanation;Demonstration;Tactile cues;Verbal cues    Comprehension  Verbalized understanding;Need further instruction;Returned demonstration;Verbal cues required;Tactile cues required;Other (comment) model of oral /pharyngeal cavities   model of oral /pharyngeal cavities      SLP Short Term Goals - 09/18/17  [place] #1   Title  pt will demo swallowing HEP with occasional min A over three sessions    Status  Not Met      SLP SHORT TERM GOAL #2   Title  pt will tell SLP 3 overt s/s aspiration PNA with rare min A over two sessions    Time  1    Status  Deferred       SLP Long Term Goals - 09/18/17 1718      SLP LONG TERM GOAL #1   Title  pt will complete HEP at least 6/7 days/week, reported by mother and/or pt    Time  5    Period  Weeks    Status  On-going      SLP LONG TERM GOAL #2   Title  pt will complete swallowing HEP with rare min A    Time  5    Period  Weeks    Status  On-going       SLP LONG TERM GOAL #3   Title  pt will tell SLP 3 overt s/s aspiration PNA in 3 therapy sessions    Time  5    Period  Weeks    Status  On-going       Plan - 09/18/17 1717    Clinical Impression Statement  Pt requires cont'd A for HEP for dysphagia - continue skilled ST for possible, eventual pleasure feeds safely. See "skilled interventions" for more details.    Speech Therapy Frequency  2x / week    Duration  -- 8 weeks/17 total visits    Treatment/Interventions  Aspiration precaution training;Pharyngeal strengthening exercises;Internal/external aids;Multimodal communcation approach;Patient/family education;Compensatory strategies;SLP instruction and feedback    Potential to Achieve Goals  Fair    Potential Considerations  Severity of impairments;Previous level of function       Patient will benefit from skilled therapeutic intervention in order to improve the following deficits and impairments:   Oropharyngeal dysphagia    Problem List Patient Active Problem List   Diagnosis Date Noted  . Asthma with status asthmaticus 07/13/2013  . Hypothyroidism 07/13/2013  . Cerebral palsy ([place]) 07/13/2013  . Schizo affective schizophrenia ([place]) 07/13/2013  . 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 Anxiety 07/13/2013  . Status asthmaticus 07/13/2013    [id number] ,MS, CCC-SLP  09/18/2017, 5:18 PM  [facility] [address] [place], [place], [address] Phone: [phone]   Fax:  [phone]   Name: Angela Nicholson MRN: [id number] Date of Birth: 04/13/1971

## 2017-09-18 NOTE — Patient Instructions (Signed)
Take a BIG breath and make your lungs be full, like that bag I showed you. Then blow hard and quick!

## 2017-09-21 ENCOUNTER — Ambulatory Visit: Payer: Medicare Other | Admitting: Speech Pathology

## 2017-09-21 DIAGNOSIS — R1312 Dysphagia, oropharyngeal phase: Secondary | ICD-10-CM | POA: Diagnosis not present

## 2017-09-21 NOTE — Therapy (Signed)
[facility] [address] [place], [place], [address] Phone: [phone]   Fax:  [phone]  Speech Language Pathology Treatment  Patient Details  Name: Angela Nicholson MRN: [id number] Date of Birth: February 17, 1971 Referring Provider: Kate Sable, MD   Encounter Date: 09/21/2017  End of Session - 09/21/17 1328    Visit Number  8    Number of Visits  17    Date for SLP Re-Evaluation  10/23/17    SLP Start Time  1101    SLP Stop Time   6803    SLP Time Calculation (min)  43 min    Activity Tolerance  Patient tolerated treatment well       Past Medical History:  Diagnosis Date  . Asthma   . Barrett esophagus   . Deaf   . GERD (gastroesophageal reflux disease)   . Schizo affective schizophrenia ([place])     No past surgical history on file.  There were no vitals filed for this visit.  Subjective Assessment - 09/21/17 1104    Subjective  "This has improved."    Patient is accompained by:  Family member;Interpreter    Currently in Pain?  No/denies            ADULT SLP TREATMENT - 09/21/17 1101      General Information   Behavior/Cognition  Alert;Cooperative;Pleasant mood    Patient Positioning  Upright in chair      Treatment Provided   Treatment provided  Dysphagia      Dysphagia Treatment   Temperature Spikes Noted  No    Respiratory Status  Room air    Oral Cavity - Dentition  Adequate natural dentition    Treatment Methods  Therapeutic exercise;Patient/caregiver education    Patient observed directly with PO's  No    Type of cueing  Tactile;Visual    Amount of cueing  Maximal    Other treatment/comments  SLP focused today's session on RMT. Pt reports completing at home, but appears to have little awareness of when she has performed the exercise correctly. Pt had greater success with IMT vs EMT this session. Pt struggled with coordination and timing of inhales, exhales, as well as taking full  inhales/exhales. Best success was with max cues for "fast, big inhale" and slow exhale with SLP counting (visually) to time exhale for at least 2 seconds. With max cues, pt completes 5 sets of 5 on lowest setting for IMT, with approximately 60% accuracy for technique. First 2 repetitions are typically strongest, with shallower, more rapid attempts as the exercise progressed necessitating usual max A. When similar cuing used for EMT, pt was not able to perform accurately. SLP instructed pt/mother to work only on IMT this week, and mother recorded notes to help cue pt for slower, full exhale.      Pain Assessment   Pain Assessment  No/denies pain      Assessment / Recommendations / Plan   Plan  Continue with current plan of care      Dysphagia Recommendations   Diet recommendations  -- NPO      Progression Toward Goals   Progression toward goals  Progressing toward goals slow progress, limited by communication and cognition      General Information   Reason PO's not observed  Other (comment) therapeutic exercise       SLP Education - 09/21/17 1327    Education provided  Yes    Education Details  pt making slow progress;  will continue to monitor progress over the next few sessions to determine if skilled ST remains warranted    Person(s) Educated  Patient;Parent(s)    Methods  Explanation    Comprehension  Verbalized understanding       SLP Short Term Goals - 09/21/17 1334      SLP SHORT TERM GOAL #1   Title  pt will demo swallowing HEP with occasional min A over three sessions    Status  Not Met      SLP SHORT TERM GOAL #2   Title  pt will tell SLP 3 overt s/s aspiration PNA with rare min A over two sessions    Status  Deferred       SLP Long Term Goals - 09/21/17 1334      SLP LONG TERM GOAL #1   Title  pt will complete HEP at least 6/7 days/week, reported by mother and/or pt    Time  4    Period  Weeks    Status  On-going      SLP LONG TERM GOAL #2   Title  pt will  complete swallowing HEP with rare min A    Time  4    Period  Weeks    Status  On-going      SLP LONG TERM GOAL #3   Title  pt will tell SLP 3 overt s/s aspiration PNA in 3 therapy sessions    Time  4    Period  Weeks    Status  On-going       Plan - 09/21/17 1329    Clinical Impression Statement  Pt requires cont'd max A for HEP for dysphagia. See "skilled interventions" for more details. SLP questions fatigue vs body awareness vs understanding of exercises resulting in inconsistent performance. Continue skilled ST for possible, eventual pleasure feeds safely.     Speech Therapy Frequency  2x / week    Treatment/Interventions  Aspiration precaution training;Pharyngeal strengthening exercises;Internal/external aids;Multimodal communcation approach;Patient/family education;Compensatory strategies;SLP instruction and feedback    Potential to Achieve Goals  Fair    Potential Considerations  Severity of impairments;Previous level of function;Ability to learn/carryover information    Consulted and Agree with Plan of Care  Patient;Family member/caregiver    Family Member Consulted  mom       Patient will benefit from skilled therapeutic intervention in order to improve the following deficits and impairments:   Oropharyngeal dysphagia    Problem List Patient Active Problem List   Diagnosis Date Noted  . Asthma with status asthmaticus 07/13/2013  . Hypothyroidism 07/13/2013  . Cerebral palsy ([place]) 07/13/2013  . Schizo affective schizophrenia ([place]) 07/13/2013  . 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 Anxiety 07/13/2013  . Status asthmaticus 07/13/2013   Deneise Lever, [place], [place] 09/21/2017, 1:35 PM  [facility] [address] [place], [place], [address] Phone: [phone]   Fax:   [phone]   Name: Angela Nicholson MRN: [id number] Date of Birth: April 18, 1971

## 2017-09-25 ENCOUNTER — Encounter: Payer: Self-pay | Admitting: Rehabilitative and Restorative Service Providers"

## 2017-09-25 ENCOUNTER — Ambulatory Visit: Payer: Medicare Other | Admitting: Rehabilitative and Restorative Service Providers"

## 2017-09-25 ENCOUNTER — Ambulatory Visit: Payer: Medicare Other

## 2017-09-25 DIAGNOSIS — M6281 Muscle weakness (generalized): Secondary | ICD-10-CM

## 2017-09-25 DIAGNOSIS — R1312 Dysphagia, oropharyngeal phase: Secondary | ICD-10-CM | POA: Diagnosis not present

## 2017-09-25 DIAGNOSIS — R2681 Unsteadiness on feet: Secondary | ICD-10-CM

## 2017-09-25 NOTE — Therapy (Signed)
[facility] [address] [place], [place], [address] Phone: [phone]   Fax:  [phone]  Speech Language Pathology Treatment  Patient Details  Name: Angela Nicholson MRN: [id number] Date of Birth: 02/21/1971 Referring Provider: Kate Sable, MD   Encounter Date: 09/25/2017  End of Session - 09/25/17 1720    Visit Number  9    Number of Visits  17    Date for SLP Re-Evaluation  10/23/17    SLP Start Time  1149    SLP Stop Time   1230    SLP Time Calculation (min)  41 min    Activity Tolerance  Patient tolerated treatment well       Past Medical History:  Diagnosis Date  . Asthma   . Barrett esophagus   . Deaf   . GERD (gastroesophageal reflux disease)   . Schizo affective schizophrenia ([place])     History reviewed. No pertinent surgical history.  There were no vitals filed for this visit.  Subjective Assessment - 09/25/17 1154    Subjective  Pt takes out IMT device upon entry into ST room.    Currently in Pain?  No/denies            ADULT SLP TREATMENT - 09/25/17 1301      General Information   Behavior/Cognition  Alert;Cooperative;Pleasant mood      Dysphagia Treatment   Temperature Spikes Noted  No    Respiratory Status  Room air    Oral Cavity - Dentition  Adequate natural dentition    Treatment Methods  Therapeutic exercise;Patient/caregiver education    Patient observed directly with PO's  No    Type of cueing  Tactile;Visual    Amount of cueing  Maximal    Other treatment/comments  SLP provided max cues to pt for performance with inspiratory muscle device for 30 minutes of session today, with minimal but definite improvement with her procedure. Initially pt inhaled and exhaled with approx 1 second duration each. After max tactile and visual cues pt was exhaling air completely and inhaling quickly approx 20% of the time (compared to 0%). SLP deleted straw suck exercise from pt's HEP due  to inhalation device. Pt's procedure with other HEP appears WFL/WNL.      Assessment / Recommendations / Plan   Plan  Continue with current plan of care      Progression Toward Goals   Progression toward goals  Progressing toward goals         SLP Short Term Goals - 09/21/17 1334      SLP SHORT TERM GOAL #1   Title  pt will demo swallowing HEP with occasional min A over three sessions    Status  Not Met      SLP SHORT TERM GOAL #2   Title  pt will tell SLP 3 overt s/s aspiration PNA with rare min A over two sessions    Status  Deferred       SLP Long Term Goals - 09/25/17 1722      SLP LONG TERM GOAL #1   Title  pt will complete HEP at least 6/7 days/week, reported by mother and/or pt    Time  4    Period  Weeks    Status  On-going      SLP LONG TERM GOAL #2   Title  pt will complete swallowing HEP with rare min A    Time  4    Period  Weeks  Status  On-going      SLP LONG TERM GOAL #3   Title  pt will tell SLP 3 overt s/s aspiration PNA in 3 therapy sessions    Time  4    Period  Weeks    Status  On-going       Plan - 09/25/17 1720    Clinical Impression Statement  Pt requires cont'd max A for inspiratory muscle trainer (IMT). See "skilled interventions" for more details. SLPs still mindful of whether fatigue, and/or body awareness, and/or understanding of exercises resulting in inconsistent performance woith IMT. Continue skilled ST for possible, eventual pleasure feeds safely.     Speech Therapy Frequency  2x / week    Duration  -- 8 weeks/17 visits    Treatment/Interventions  Aspiration precaution training;Pharyngeal strengthening exercises;Internal/external aids;Multimodal communcation approach;Patient/family education;Compensatory strategies;SLP instruction and feedback    Potential to Achieve Goals  Fair    Potential Considerations  Severity of impairments;Previous level of function;Ability to learn/carryover information    Consulted and Agree with Plan  of Care  Patient;Family member/caregiver    Family Member Consulted  mom       Patient will benefit from skilled therapeutic intervention in order to improve the following deficits and impairments:   Oropharyngeal dysphagia    Problem List Patient Active Problem List   Diagnosis Date Noted  . Asthma with status asthmaticus 07/13/2013  . Hypothyroidism 07/13/2013  . Cerebral palsy ([place]) 07/13/2013  . Schizo affective schizophrenia ([place]) 07/13/2013  . 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 Anxiety 07/13/2013  . Status asthmaticus 07/13/2013    [facility] ,[place], CCC-SLP  09/25/2017, 5:23 PM  [facility] [address] [place], [place], [address] Phone: [phone]   Fax:  [phone]   Name: Gilda Abboud MRN: [id number] Date of Birth: 1971-03-16

## 2017-09-25 NOTE — Therapy (Signed)
[facility] [address] [place], [place], [address] Phone: [phone]   Fax:  [phone]  Physical Therapy Treatment  Patient Details  Name: Angela Nicholson MRN: [id number] Date of Birth: [birth year] Referring Provider: Kip A. Corrington, MD   Encounter Date: 09/25/2017  PT End of Session - 09/25/17 1203    Visit Number  20    Number of Visits  24    Date for PT Re-Evaluation  10/21/17    Authorization Type  Medicare now primary; visits added.      PT Start Time  1103    PT Stop Time  1145    PT Time Calculation (min)  42 min    Equipment Utilized During Treatment  Gait belt only during rocker board tasks    Activity Tolerance  Patient tolerated treatment well    Behavior During Therapy  WFL for tasks assessed/performed       Past Medical History:  Diagnosis Date  . Asthma   . Barrett esophagus   . Deaf   . GERD (gastroesophageal reflux disease)   . Schizo affective schizophrenia ([facility])     History reviewed. No pertinent surgical history.  There were no vitals filed for this visit.  Subjective Assessment - 09/25/17 1102    Subjective  The patient brought her exercises today to review.  She notes     Pertinent History  ataxic cerebral palsy, deaf, PEG tube, aspiration PNA, and asthma    Patient Stated Goals  To get stronger and to eat with her mouth again.    Currently in Pain?  No/denies                      [facility] Adult PT Treatment/Exercise - 09/25/17 1204      Ambulation/Gait   Ambulation/Gait  Yes    Ambulation/Gait Assistance  6: Modified independent (Device/Increase time)    Ambulation/Gait Assistance Details  PT emphasizing greater endurance with ambulation encouraging patient to walk beyond her initial requested break.  PT emphasized upright posture, longer stride.  Attempted to facilitate arm swing, however this became overly exaggerated, therefore recomended she walk with her  typical arm swing.    Ambulation Distance (Feet)  450 Feet    Assistive device  None    Ambulation Surface  Level;Indoor    Stairs  Yes    Stairs Assistance  6: Modified independent (Device/Increase time);4: Min guard    Stairs Assistance Details (indicate cue type and reason)  Patient mod indep with rails and reciprocal pattern.  Without rails and reciprocal pattern, PT provided CGA for safety.  Pt used intermittent UE support through therapist's hands.    Gait Comments  Gait activities performing dual tasks of ball toss, walking wiht passing ball laterally for trunk roation (to facilitate arm swing),      Self-Care   Self-Care  Other Self-Care Comments    Other Self-Care Comments   Discussed d/c date and encouraged patient's mother to check into [employer] scholarship program and [facility].        Neuro Re-ed    Neuro Re-ed Details   Standing on balance bean/foam surface emphasizing static standing, reaching overhead alternating UEs, standing on foam with eyes closed.  Also performed sidestepping along foam surface.  Alternating foot taps to cones with close supervision.  Single leg stance activities rolling ball with contralateral LE with min A for trunk upright.  Marching with ball overhead.  Exercises   Other Exercises   Reviewed stand>sitting working on eccentric control for HEP.               PT Education - 09/25/17 1201    Education provided  Yes    Education Details  Patient's mother and PT discussed current plan of care ending 10/21/2017.  We discussed need to begin looking into [employer] and other community programs.  PT and interpreter discussed interpreter services and printed out local resoures to discuss option in the community.  PT also reviewed HEP stand>sit with the patient.    Person(s) Educated  Patient;Parent(s)    Methods  Explanation;Demonstration    Comprehension  Verbalized understanding;Returned demonstration       PT Short Term Goals - 08/23/17 1432       PT SHORT TERM GOAL #1   Title  = LTG        PT Long Term Goals - 08/23/17 1433      PT LONG TERM GOAL #1   Title  Pt will participate in HEP, walking program and [facility]-4/7 days per week with mother    Time  8    Period  Weeks    Status  Revised    Target Date  10/21/17      PT LONG TERM GOAL #3   Title  The patient will ambulate nonstop x 10 minutes in order to improve community ambulation.     Baseline  PROGRESSING:(2/1) pt ambulates 10 min with one rest break at 4 min 2/2 fatigue    Time  8    Period  Weeks    Status  Revised    Target Date  10/21/17      PT LONG TERM GOAL #4   Title  Patient will negotiate 8 stairs without use of rails and supervision    Time  8    Period  Weeks    Status  New    Target Date  10/21/17      PT LONG TERM GOAL #5   Title  The patient will negotiate community surfaces (parking lots, curb, grass) without UE support x 1000 ft for improved community negotiation.    Baseline  1,000 outside with one and then two person HHA for most of the distance    Time  8    Period  Weeks    Status  Revised    Target Date  10/21/17            Plan - 09/25/17 1208    Clinical Impression Statement  The patient is progressing with with general exercise/activity tolerance and mobility in PT.  Encouraging transition to community services for exercise in preparation for discharge from physical therapy.  Goal of community activities will be to maintain strength and endurance gains made in PT.      PT Treatment/Interventions  ADLs/Self Care Home Management;Gait training;Stair training;Functional mobility training;Therapeutic activities;Therapeutic exercise;Balance training;Neuromuscular re-education;Patient/family education;Energy conservation    PT Next Visit Plan  Focus on community exercise transition, begin checking LTGs, progress endurance, gait outdoors, stair negotiation with alternating pattern.    Consulted and Agree with  Plan of Care  Patient;Family member/caregiver       Patient will benefit from skilled therapeutic intervention in order to improve the following deficits and impairments:  Impaired flexibility, Decreased balance, Decreased activity tolerance, Decreased endurance, Decreased strength, Postural dysfunction, Abnormal gait  Visit Diagnosis: Muscle weakness (generalized)  Unsteadiness on feet  Problem List Patient Active Problem List   Diagnosis Date Noted  . Asthma with status asthmaticus 07/13/2013  . Hypothyroidism 07/13/2013  . Cerebral palsy ([facility]) 07/13/2013  . Schizo affective schizophrenia ([facility]) 07/13/2013  . 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 Anxiety 07/13/2013  . Status asthmaticus 07/13/2013    Verline Kong, PT 09/25/2017, 12:10 PM  [facility] [place] [address] [place], [place], [address] Phone: [phone]   Fax:  [phone]  Name: Angela Nicholson MRN: [id number] Date of Birth: [birth year]

## 2017-09-25 NOTE — Patient Instructions (Addendum)
[facility] [address] [place], [place] [address]  Voice [phone]  Toll Free Voice [phone] Video Phone [phone] TTY [phone] Fax [phone]    Counties Served: [place], [place], [place], [place], [place], [place], [place], [place], [place] and [place].  Erroll Lunauben Leon [email or web address] Manager  [facility] Toole [email or web address] .[email or web address]://[email or web address]/[place].gov Deaf-Blind Services Specialist  Eldridge Dacehristina Moore Zahria [email or web address] .[email or web address]://[email or web address]/[place].gov Deaf Services Specialist  Katie ErathFranklin [email or web address] .https://hunt-[email or web address]/[place].gov Hard of Hearing Services Specialist  Erica L. Eliseo SquiresHarris [email or web address] .[email or web address]://[email or web address]/[place].gov Interpreting Services Specialist  Kelli ChurnJohn Sherwood [email or web address] .https://hunt-bailey.com/[place].gov ImmunologistCommunications Accessibility Consultant  Beth Coleman [email or web address] .https://hunt-bailey.com/[place].gov Telecommunications Consultant  Jaye Ray [email or web address] .[email or web address]://[email or web address].com/[place].gov Office Assistant    [facility] [address], [place] [address] - Phone: [phone]  2016 Services for the Deaf and Hard of Hearing of [place]. ?All Right Reserved.   [facility] [employer] [facility] [address] [place], [place] [address] Phone: [phone] Fax: [phone] [email or web address]

## 2017-09-28 ENCOUNTER — Ambulatory Visit: Payer: Medicare Other

## 2017-09-28 DIAGNOSIS — R1312 Dysphagia, oropharyngeal phase: Secondary | ICD-10-CM | POA: Diagnosis not present

## 2017-09-28 NOTE — Therapy (Signed)
[place] [address] [place], [place], [address] Phone: [phone]   Fax:  [phone]  Speech Language Pathology Treatment  Patient Details  Name: Angela Nicholson MRN: [id number] Date of Birth: [birth year] Referring Provider: Kate Sable, MD   Encounter Date: 09/28/2017  End of Session - 09/28/17 1158    Visit Number  10    Number of Visits  17    Date for SLP Re-Evaluation  10/23/17    SLP Start Time  1103    SLP Stop Time   1139    SLP Time Calculation (min)  36 min       Past Medical History:  Diagnosis Date  . Asthma   . Barrett esophagus   . Deaf   . GERD (gastroesophageal reflux disease)   . Schizo affective schizophrenia ([place])     No past surgical history on file.  There were no vitals filed for this visit.  Subjective Assessment - 09/28/17 1106    Subjective  Pt arrives with [name], new interpreter for this SLP.    Patient is accompained by:  Family member;Interpreter mom    Currently in Pain?  No/denies            ADULT SLP TREATMENT - 09/28/17 0001      General Information   Behavior/Cognition  Alert;Cooperative;Pleasant mood      Dysphagia Treatment   Temperature Spikes Noted  No    Respiratory Status  Room air    Oral Cavity - Dentition  Adequate natural dentition    Treatment Methods  Therapeutic exercise;Patient/caregiver education    Patient observed directly with PO's  Yes    Type of PO's observed  Ice chips    Type of cueing  Tactile;Visual    Amount of cueing  Maximal    Other treatment/comments  SLP used maximal cueing with MEndelsohn using small (approx 1/8 - 1/4 teaspoon) ice chips - pt unable to correctly generate the exercise with verbal cueing, tactile cueing, visual cueing, and modeling- SLP believes due to reduced strength and body awareness. Min assist rarely with other exercises. Approx 70% of the session today spent on [place]. AT home, pt to  familiarize herself with how the thyroid moves during the swallow.      Assessment / Recommendations / Plan   Plan  Continue with current plan of care      Progression Toward Goals   Progression toward goals  Progressing toward goals       SLP Education - 09/28/17 1157    Education provided  Yes    Education Details  [employer]) Educated  Patient;Parent(s)    Methods  Explanation;Demonstration;Tactile cues;Verbal cues;Other (comment) modeling   modeling   Comprehension  Verbalized understanding;Verbal cues required;Tactile cues required;Need further instruction modeling necessary   modeling necessary      SLP Short Term Goals - 09/21/17 1334      SLP SHORT TERM GOAL #1   Title  pt will demo swallowing HEP with occasional min A over three sessions    Status  Not Met      SLP SHORT TERM GOAL #2   Title  pt will tell SLP 3 overt s/s aspiration PNA with rare min A over two sessions    Status  Deferred       SLP Long Term Goals - 09/28/17 1201      SLP LONG TERM GOAL #1   Title  pt will complete  HEP at least 6/7 days/week, reported by mother and/or pt    Time  3    Period  Weeks    Status  On-going      SLP LONG TERM GOAL #2   Title  pt will complete swallowing HEP with rare min A    Time  3    Period  Weeks    Status  On-going      SLP LONG TERM GOAL #3   Title  pt will tell SLP 3 overt s/s aspiration PNA in 3 therapy sessions    Time  3    Period  Weeks    Status  On-going       Plan - 09/28/17 1159    Clinical Impression Statement  Pt requires cont'd max A for HEP for dysphagia. See "skilled interventions" for more details. SLP cont to question pt fatigue and/or pt body awareness resulting in inability to achieve [facility]. Continue skilled ST for possible, eventual pleasure feeds safely.     Speech Therapy Frequency  2x / week    Treatment/Interventions  Aspiration precaution training;Pharyngeal strengthening exercises;Internal/external  aids;Multimodal communcation approach;Patient/family education;Compensatory strategies;SLP instruction and feedback    Potential to Achieve Goals  Fair    Potential Considerations  Severity of impairments;Previous level of function;Ability to learn/carryover information    Consulted and Agree with Plan of Care  Patient;Family member/caregiver    Family Member Consulted  mom       Patient will benefit from skilled therapeutic intervention in order to improve the following deficits and impairments:   Oropharyngeal dysphagia    Problem List Patient Active Problem List   Diagnosis Date Noted  . Asthma with status asthmaticus 07/13/2013  . Hypothyroidism 07/13/2013  . Cerebral palsy ([place]) 07/13/2013  . Schizo affective schizophrenia ([place]) 07/13/2013  . 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 Anxiety 07/13/2013  . Status asthmaticus 07/13/2013    [facility] ,MS, CCC-SLP  09/28/2017, 12:01 PM  [place] [address] [place], [place], [address] Phone: [phone]   Fax:  [phone]   Name: Angela Nicholson MRN: [id number] Date of Birth: 04-Jun-1971

## 2017-10-02 ENCOUNTER — Ambulatory Visit: Payer: Medicare Other | Admitting: Physical Therapy

## 2017-10-02 ENCOUNTER — Ambulatory Visit: Payer: Medicare Other

## 2017-10-02 ENCOUNTER — Encounter: Payer: Self-pay | Admitting: Physical Therapy

## 2017-10-02 DIAGNOSIS — R1312 Dysphagia, oropharyngeal phase: Secondary | ICD-10-CM

## 2017-10-02 DIAGNOSIS — M6281 Muscle weakness (generalized): Secondary | ICD-10-CM

## 2017-10-02 DIAGNOSIS — R2681 Unsteadiness on feet: Secondary | ICD-10-CM

## 2017-10-02 DIAGNOSIS — R260 Ataxic gait: Secondary | ICD-10-CM

## 2017-10-02 DIAGNOSIS — G804 Ataxic cerebral palsy: Secondary | ICD-10-CM

## 2017-10-02 NOTE — Therapy (Signed)
[facility] [address] [place], [place], [address] Phone: [phone]   Fax:  [phone]  Physical Therapy Treatment  Patient Details  Name: Angela Nicholson MRN: [id number] Date of Birth: 08-Apr-1971 Referring Provider: Kip A. Corrington, MD   Encounter Date: 10/02/2017  PT End of Session - 10/02/17 1300    Visit Number  21    Number of Visits  24    Date for PT Re-Evaluation  10/21/17    Authorization Type  Medicare now primary; visits added.      PT Start Time  1108    PT Stop Time  1150    PT Time Calculation (min)  42 min    Equipment Utilized During Treatment  Gait belt only during rocker board tasks    Activity Tolerance  Patient tolerated treatment well    Behavior During Therapy  WFL for tasks assessed/performed       Past Medical History:  Diagnosis Date  . Asthma   . Barrett esophagus   . Deaf   . GERD (gastroesophageal reflux disease)   . Schizo affective schizophrenia (HCC)     History reviewed. No pertinent surgical history.  There were no vitals filed for this visit.  Subjective Assessment - 10/02/17 1121    Subjective  No complaints from patient, "I haven't seen you in a while".  Asking about how many visits left.    Pertinent History  ataxic cerebral palsy, deaf, PEG tube, aspiration PNA, and asthma    Patient Stated Goals  To get stronger and to eat with her mouth again.    Currently in Pain?  No/denies                      [facility] Adult PT Treatment/Exercise - 10/02/17 1130      Ambulation/Gait   Stairs  Yes    Stairs Assistance  4: Min guard    Stairs Assistance Details (indicate cue type and reason)  focus on control during descent and decreasing UE support on therapist; pt cued to pause in standing on descending LE before taking contralateral LE down to next step.  With repetition pt able to perform without rails and only having to slightly touch therapist.    Stair  Management Technique  No rails;Alternating pattern;Forwards    Number of Stairs  16    Height of Stairs  6      Knee/Hip Exercises: Aerobic   Nustep  L2 x 10 minutes with bilat UE and LE for endurance and strengthening           Balance Exercises - 10/02/17 1142      Balance Exercises: Standing   Rockerboard  Anterior/posterior;Lateral;UE support stepping over, forward/retro step back, lateral step     Other Standing Exercises  one LE on rockerboard performed step overs forwards and backwards 10 reps each LE; standing on rockerboard performed alternating forwards, backwards and lateral tap downs for ankle strengthening and control x 10 reps each direction; all with min A of therapist, no UE support and cues for upright trunk        PT Education - 10/02/17 1259    Education provided  Yes    Education Details  exercise classes at [facility].  Transition to community wellness    Person(s) Educated  Patient;Parent(s)    Methods  Explanation    Comprehension  Verbalized understanding       PT Short Term Goals - 08/23/17 1432  PT SHORT TERM GOAL #1   Title  = LTG        PT Long Term Goals - 08/23/17 1433      PT LONG TERM GOAL #1   Title  Pt will participate in HEP, walking program and [facility] wellness [facility] 3-4/7 days per week with mother    Time  8    Period  Weeks    Status  Revised    Target Date  10/21/17      PT LONG TERM GOAL #3   Title  The patient will ambulate nonstop x 10 minutes in order to improve community ambulation.     Baseline  PROGRESSING:(2/1) pt ambulates 10 min with one rest break at 4 min 2/2 fatigue    Time  8    Period  Weeks    Status  Revised    Target Date  10/21/17      PT LONG TERM GOAL #4   Title  Patient will negotiate 8 stairs without use of rails and supervision    Time  8    Period  Weeks    Status  New    Target Date  10/21/17      PT LONG TERM GOAL #5   Title  The patient will negotiate community surfaces (parking lots,  curb, grass) without UE support x 1000 ft for improved community negotiation.    Baseline  1,000 outside with one and then two person HHA for most of the distance    Time  8    Period  Weeks    Status  Revised    Target Date  10/21/17            Plan - 10/02/17 1300    Clinical Impression Statement  Continued discussion regarding community wellness options, classess and exercise equipment, at [facility] pt can utilize at D/C to maintain functional gains. Provided mother with list of [employer] classes to observe or investigate while pt performed endurance on Nustep; pt's endurance continues to improve as pt performed 10 minutes easily with no SOB.  Continued dynamic balance and weight shift training on unstable surface with focus on posture, SLS and control of weight shift.  Continued stair negotiation training with focus on increasing control during descent.  Will continue to address and progress towards LTG.    PT Treatment/Interventions  ADLs/Self Care Home Management;Gait training;Stair training;Functional mobility training;Therapeutic activities;Therapeutic exercise;Balance training;Neuromuscular re-education;Patient/family education;Energy conservation    PT Next Visit Plan  try treadmill for [employer] use, progress endurance, gait outdoors, stair negotiation with alternating pattern without UE support - SLS and slow descent.    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 Asthma  with status asthmaticus 07/13/2013  . Hypothyroidism 07/13/2013  . Cerebral palsy (HCC) 07/13/2013  . Schizo affective schizophrenia (HCC) 07/13/2013  . 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 Anxiety 07/13/2013  . Status asthmaticus 07/13/2013   Bufford LopeAudra  Georgena Spurling, PT, DPT 10/02/17    1:09 PM    [facility] [address] [place], [place], [address] Phone: [phone]   Fax:  [phone]  Name: Angela Nicholson MRN: [id number] Date of Birth: 08/30/1970

## 2017-10-05 ENCOUNTER — Ambulatory Visit: Payer: Medicare Other | Admitting: Physical Therapy

## 2017-10-05 DIAGNOSIS — R2681 Unsteadiness on feet: Secondary | ICD-10-CM

## 2017-10-05 DIAGNOSIS — M6281 Muscle weakness (generalized): Secondary | ICD-10-CM

## 2017-10-05 DIAGNOSIS — R260 Ataxic gait: Secondary | ICD-10-CM

## 2017-10-05 DIAGNOSIS — G804 Ataxic cerebral palsy: Secondary | ICD-10-CM

## 2017-10-05 DIAGNOSIS — R1312 Dysphagia, oropharyngeal phase: Secondary | ICD-10-CM | POA: Diagnosis not present

## 2017-10-05 NOTE — Therapy (Signed)
[facility] [address] [place], [place], [address] Phone: [phone]   Fax:  [phone]  Physical Therapy Treatment  Patient Details  Name: Angela Nicholson MRN: [id number] Date of Birth: 03/20/1971 Referring Provider: Kip A. Corrington, MD   Encounter Date: 10/05/2017  PT End of Session - 10/05/17 1627    Visit Number  22    Number of Visits  24    Date for PT Re-Evaluation  10/21/17    Authorization Type  Medicare now primary; visits added.      PT Start Time  1542 pt arrived late    PT Stop Time  1616    PT Time Calculation (min)  34 min    Equipment Utilized During Treatment  -- only during rocker board tasks    Activity Tolerance  Patient tolerated treatment well    Behavior During Therapy  WFL for tasks assessed/performed       Past Medical History:  Diagnosis Date  . Asthma   . Barrett esophagus   . Deaf   . GERD (gastroesophageal reflux disease)   . Schizo affective schizophrenia (HCC)     No past surgical history on file.  There were no vitals filed for this visit.  Subjective Assessment - 10/05/17 1545    Subjective  "When is my last visit with you?  I will miss you."  No issues to report.  Worked on puzzles this weekend.  Did not get to walk outside this weekend; [facility] to check out classes.    Patient is accompained by:  Family member;Interpreter    Pertinent History  ataxic cerebral palsy, deaf, PEG tube, aspiration PNA, and asthma    Limitations  Walking;Other (comment)    Patient Stated Goals  To get stronger and to eat with her mouth again.    Currently in Pain?  No/denies                      [facility] Adult PT Treatment/Exercise - 10/05/17 1548      Neuro Re-ed    Neuro Re-ed Details   postural control and sitting balance on physioball while performing 10 reps each with 1lb hand weight: bicep curl, overhead press, chest fly and D2 flex <> extension with  trunk rotation.  Pt able to perform with supervision for balance; verbal cues to keep heels on floor and decrease pushing through toes.  Also performed without hand weights: alternating UE and LE lifts x 12 reps with min A at trunk for stability and cues for increased control and decreasing speed of movement      Knee/Hip Exercises: Aerobic   Tread Mill  attempted at slow speeds: 0.2 - 0.5 mph with visual cues for increased stance time and step length; pt requested to cease due to discomfort    Nustep  L4 x 10 minutes with bilat UE and LE for endurance and strengthening             PT Education - 10/05/17 1627    Education provided  Yes    Education Details  [facility]ing to [facility] more visits    Person(s) Educated  Patient;Parent(s)    Methods  Explanation    Comprehension  Verbalized understanding       PT Short Term Goals - 08/23/17 1432      PT SHORT TERM GOAL #1   Title  = LTG  PT Long Term Goals - 08/23/17 1433      PT LONG TERM GOAL #1   Title  Pt will participate in HEP, walking program and community wellness at [facility] 3-4/7 days per week with mother    Time  8    Period  Weeks    Status  Revised    Target Date  10/21/17      PT LONG TERM GOAL #3   Title  The patient will ambulate nonstop x 10 minutes in order to improve community ambulation.     Baseline  PROGRESSING:(2/1) pt ambulates 10 min with one rest break at 4 min 2/2 fatigue    Time  8    Period  Weeks    Status  Revised    Target Date  10/21/17      PT LONG TERM GOAL #4   Title  Patient will negotiate 8 stairs without use of rails and supervision    Time  8    Period  Weeks    Status  New    Target Date  10/21/17      PT LONG TERM GOAL #5   Title  The patient will negotiate community surfaces (parking lots, curb, grass) without UE support x 1000 ft for improved community negotiation.    Baseline  1,000 outside with one and then two person HHA for most of the distance    Time  8     Period  Weeks    Status  Revised    Target Date  10/21/17            Plan - 10/05/17 1628    Clinical Impression Statement  Treatment session today focused on various exercise machines and types of exercises pt could use at [facility].  Attempted to perform gait and endurance on treadmill but pt did not feel safe on treadmill so transitioned to Nustep for endurance.  Pt is demonstrated improved endurance as pt did not experience SOB or fatigue after treadmill + 10 minutes on Nustep at level 4.  Performed combination of UE strengthening and postural control training on physioball - pt's mother has a physioball at home and pt may be able to use with supervision.  Will continue to address and transition to community wellness and home exercises over next two sessions      PT Treatment/Interventions  ADLs/Self Care Home Management;Gait training;Stair training;Functional mobility training;Therapeutic activities;Therapeutic exercise;Balance training;Neuromuscular re-education;Patient/family education;Energy conservation    PT Next Visit Plan  start checking LTG: finalize HEP and [employer] program, stair negotiation with alternating pattern without UE support - SLS and slow descent.    Consulted and Agree with Plan of Care  Patient;Family member/caregiver    Family Member Consulted  Mother       Patient will benefit from skilled therapeutic intervention in order to improve the following deficits and impairments:  Impaired flexibility, Decreased balance, Decreased activity tolerance, Decreased endurance, Decreased strength, Postural dysfunction, Abnormal gait  Visit Diagnosis: Muscle weakness (generalized)  Unsteadiness on feet  Ataxic cerebral palsy (HCC)  Ataxic gait     Problem List Patient Active Problem List   Diagnosis Date Noted  . Asthma with status asthmaticus 07/13/2013  . Hypothyroidism 07/13/2013  . Cerebral palsy (HCC) 07/13/2013  . Schizo affective schizophrenia (HCC) 07/13/2013  .  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 Anxiety 07/13/2013  . Status asthmaticus 07/13/2013    Bufford Lope  Georgena Spurling, PT, DPT 10/05/17    4:55 PM    [place] [address] [place], [place], [address] Phone: [phone]   Fax:  [phone]  Name: Angela Nicholson MRN: [id number] Date of Birth: [birth year]

## 2017-10-05 NOTE — Therapy (Signed)
[place] [address] [place], [place], [address] Phone: [phone]   Fax:  [phone]  Speech Language Pathology Treatment  Patient Details  Name: Angela Nicholson MRN: [id number] Date of Birth: Jan 17, 1971 Referring Provider: Kate Sable, MD   Encounter Date: 10/02/2017  End of Session - 10/05/17 1220    Visit Number  11    Number of Visits  17    Date for SLP Re-Evaluation  10/23/17    SLP Start Time  1147    SLP Stop Time   1227    SLP Time Calculation (min)  40 min    Activity Tolerance  Patient tolerated treatment well       Past Medical History:  Diagnosis Date  . Asthma   . Barrett esophagus   . Deaf   . GERD (gastroesophageal reflux disease)   . Schizo affective schizophrenia ([place])     History reviewed. No pertinent surgical history.  There were no vitals filed for this visit.         ADULT SLP TREATMENT - 10/05/17 0001      General Information   Behavior/Cognition  Alert;Cooperative;Pleasant mood      Dysphagia Treatment   Temperature Spikes Noted  No    Respiratory Status  Room air    Oral Cavity - Dentition  Adequate natural dentition    Treatment Methods  Therapeutic exercise;Patient/caregiver education    Patient observed directly with PO's  Yes    Type of PO's observed  Ice chips    Type of cueing  Tactile;Visual    Amount of cueing  Maximal    Other treatment/comments  Max cues consistently to teach Mendelsohn and Masako; 90% of session spent on these two exercises, necessitating long explanations and demos for pt to understand. Pt with 1/5 correct reps with Caryl Ada with 75% drop in thyroid cartilage but held at approx 25% lift for approx 2 seconds. Pt performed Masako with smaller than desired protrusion but SLP told pt to cont to perform with this technique and to attempt at larger protrusion intermittently. Pt now performing tongue press with entire tongue and not  just tip.       Assessment / Recommendations / Plan   Plan  Continue with current plan of care      Progression Toward Goals   Progression toward goals  Progressing toward goals         SLP Short Term Goals - 09/21/17 1334      SLP SHORT TERM GOAL #1   Title  pt will demo swallowing HEP with occasional min A over three sessions    Status  Not Met      SLP SHORT TERM GOAL #2   Title  pt will tell SLP 3 overt s/s aspiration PNA with rare min A over two sessions    Status  Deferred       SLP Long Term Goals - 10/02/17 1451      SLP LONG TERM GOAL #1   Title  pt will complete HEP at least 6/7 days/week, reported by mother and/or pt    Baseline  10-02-17    Time  3    Period  Weeks    Status  On-going      SLP LONG TERM GOAL #2   Title  pt will complete swallowing HEP with occasional mod A    Time  3    Period  Weeks    Status  Revised  SLP LONG TERM GOAL #3   Title  pt will tell SLP 3 overt s/s aspiration PNA in 3 therapy sessions    Time  3    Period  Weeks    Status  On-going         Patient will benefit from skilled therapeutic intervention in order to improve the following deficits and impairments:   Oropharyngeal dysphagia    Problem List Patient Active Problem List   Diagnosis Date Noted  . Asthma with status asthmaticus 07/13/2013  . Hypothyroidism 07/13/2013  . Cerebral palsy ([place]) 07/13/2013  . Schizo affective schizophrenia ([place]) 07/13/2013  . 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 Anxiety 07/13/2013  . Status asthmaticus 07/13/2013    [facility] ,[place], CCC-SLP  10/05/2017, 12:20 PM  [place] [address] [place], [place], [address] Phone: [phone]   Fax:  [phone]   Name: Aidah Forquer MRN: [id number] Date of Birth: [birth year]

## 2017-10-12 ENCOUNTER — Encounter: Payer: Self-pay | Admitting: Physical Therapy

## 2017-10-12 ENCOUNTER — Ambulatory Visit: Payer: Medicare Other | Admitting: Physical Therapy

## 2017-10-12 DIAGNOSIS — R260 Ataxic gait: Secondary | ICD-10-CM

## 2017-10-12 DIAGNOSIS — G804 Ataxic cerebral palsy: Secondary | ICD-10-CM

## 2017-10-12 DIAGNOSIS — R2681 Unsteadiness on feet: Secondary | ICD-10-CM

## 2017-10-12 DIAGNOSIS — R1312 Dysphagia, oropharyngeal phase: Secondary | ICD-10-CM | POA: Diagnosis not present

## 2017-10-12 DIAGNOSIS — M6281 Muscle weakness (generalized): Secondary | ICD-10-CM

## 2017-10-12 NOTE — Therapy (Signed)
[facility] [address] [place], [place], [address] Phone: [phone]   Fax:  [phone]  Physical Therapy Treatment  Patient Details  Name: Angela Nicholson MRN: [id number] Date of Birth: 1970/11/09 Referring Provider: Kip A. Corrington, MD   Encounter Date: 10/12/2017  PT End of Session - 10/12/17 1635    Visit Number  23    Number of Visits  24    Date for PT Re-Evaluation  10/21/17    Authorization Type  Medicare now primary; visits added.      PT Start Time  1535    PT Stop Time  1615    PT Time Calculation (min)  40 min    Equipment Utilized During Treatment  -- only during rocker board tasks    Activity Tolerance  Patient tolerated treatment well    Behavior During Therapy  WFL for tasks assessed/performed       Past Medical History:  Diagnosis Date  . Asthma   . Barrett esophagus   . Deaf   . GERD (gastroesophageal reflux disease)   . Schizo affective schizophrenia (HCC)     History reviewed. No pertinent surgical history.  There were no vitals filed for this visit.  Subjective Assessment - 10/12/17 1540    Subjective  Pt's mother was able to go to the [facility] and observe classes.  Aide present to go over exercises and machines to use at the [facility] so that aide can assist her.    Patient is accompained by:  Family member;Interpreter    Pertinent History  ataxic cerebral palsy, deaf, PEG tube, aspiration PNA, and asthma    Limitations  Walking;Other (comment)    Patient Stated Goals  To get stronger and to eat with her mouth again.    Currently in Pain?  No/denies                No data recorded       [facility] Adult PT Treatment/Exercise - 10/12/17 1556      Knee/Hip Exercises: Aerobic   Nustep  L4 x 10 minutes with bilat UE and LE for strengthening and endurance; discussed with mother and aide ways to progress time and resistance and how to set pt up on Nustep      Reviewed and had  pt return demonstrate the following UE exercises seated in chair with feet supported but without back support, focusing on upright posture.   Arm Exercises:   PERFORM ALL WEIGHT EXERCISES IN SITTING; NO BACK SUPPORT.  1 POUND WEIGHT  Elbow Curl: Palm Up    Arm at side, palm up, with weight in hand. Bend elbow. Hold momentarily. Return slowly. Keep wrist stable. Repeat _10__ times each arm. Weight __1_ lbs.  ELBOW: Tricep Kickback    Sit sideways on seat; holding your weight: Hold arm bent at chest level. Straighten elbow. Keep body straight. _10__ reps per side.   Side Raise: Single Arm    Hold weight in hand with hand at side of body, palm down. Raise arm out to side, elbow straight but not locked, to 90 position with body. Hold momentarily. Return slowly. Repeat _10__ times each arm. Weight _1__ lbs.  Press: Single Arm Over Head    Hold weights, elbows bent, palms facing each other, at shoulder level. ALTERNATELY press each arm over head. Hold momentarily. Return slowly. Repeat _10__ times each arm. Weight _1__ lbs.   Shoulder Punch    In wide stance, holding _1___ pound weights  at waist, twist trunk, punching toward opposite side and pull the arm back.  Quickly repeat with other arm in opposite direction. Punch __10__ times. Do _2___ sessions per day.  Balance Exercises - 10/12/17 1639      Balance Exercises: Standing   Standing Eyes Opened  Narrow base of support (BOS);Solid surface;Other (comment) feet together, feet staggered playing zoom ball x 30-60 sec        PT Education - 10/12/17 1635    Education provided  Yes    Education Details  activities to perform at the [facility] and at home    Person(s) Educated  Patient;Parent(s);Caregiver(s)    Methods  Explanation;Demonstration;Handout    Comprehension  Verbalized understanding;Returned demonstration       PT Short Term Goals - 08/23/17 1432      PT SHORT TERM GOAL #1   Title  = LTG        PT  Long Term Goals - 08/23/17 1433      PT LONG TERM GOAL #1   Title  Pt will participate in HEP, walking program and community wellness at [facility] 3-4/7 days per week with mother    Time  8    Period  Weeks    Status  Revised    Target Date  10/21/17      PT LONG TERM GOAL #3   Title  The patient will ambulate nonstop x 10 minutes in order to improve community ambulation.     Baseline  PROGRESSING:(2/1) pt ambulates 10 min with one rest break at 4 min 2/2 fatigue    Time  8    Period  Weeks    Status  Revised    Target Date  10/21/17      PT LONG TERM GOAL #4   Title  Patient will negotiate 8 stairs without use of rails and supervision    Time  8    Period  Weeks    Status  New    Target Date  10/21/17      PT LONG TERM GOAL #5   Title  The patient will negotiate community surfaces (parking lots, curb, grass) without UE support x 1000 ft for improved community negotiation.    Baseline  1,000 outside with one and then two person HHA for most of the distance    Time  8    Period  Weeks    Status  Revised    Target Date  10/21/17            Plan - 10/12/17 1636    Clinical Impression Statement  Continued to review exercises and activities pt can perform at home and at [facility] for continued endurance, strength, balance and postural control training with supervision and assistance of mother or aide.  Pt encouraged to also participate in [facility] chair exercise classes.  Provided pt with instructions for use of Nustep, zoom ball and UE hand weight exercises.  Will check LTG and D/C at next session.    PT Treatment/Interventions  ADLs/Self Care Home Management;Gait training;Stair training;Functional mobility training;Therapeutic activities;Therapeutic exercise;Balance training;Neuromuscular re-education;Patient/family education;Energy conservation    PT Next Visit Plan  LTG and D/C    Consulted and Agree with Plan of Care  Patient;Family member/caregiver    Family Member Consulted  Mother         Patient will benefit from skilled therapeutic intervention in order to improve the following deficits and impairments:  Impaired flexibility, Decreased balance, Decreased activity tolerance, Decreased endurance, Decreased  strength, Postural dysfunction, Abnormal gait  Visit Diagnosis: Muscle weakness (generalized)  Unsteadiness on feet  Ataxic cerebral palsy (HCC)  Ataxic gait     Problem List Patient Active Problem List   Diagnosis Date Noted  . Asthma with status asthmaticus 07/13/2013  . Hypothyroidism 07/13/2013  . Cerebral palsy (HCC) 07/13/2013  . Schizo affective schizophrenia (HCC) 07/13/2013  . 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 Anxiety 07/13/2013  . Status asthmaticus 07/13/2013   Dierdre Highman, PT, DPT 10/12/17    4:41 PM    [facility] [address] [place], [place], [address] Phone: [phone]   Fax:  [phone]  Name: Angela Nicholson MRN: [id number] Date of Birth: [birth year]

## 2017-10-12 NOTE — Patient Instructions (Signed)
NuStep: seat setting #4, arm length #4, resistance level 4 for 10 minutes.  When [place] rates it as "easy" can increase time by 2 minutes  When she reaches 15 minutes, can increase resistance to level 5 but decrease time back down to 10 minutes  Zoom Ball:  Can increase the time she plays.  Can make it a balance activity by having [place] stand with feet together, feet staggered with right or left foot forwards, standing on a softer surface (foam or pillow).   Arm Exercises:   PERFORM ALL WEIGHT EXERCISES IN SITTING; NO BACK SUPPORT.  1 POUND WEIGHT  Elbow Curl: Palm Up    Arm at side, palm up, with weight in hand. Bend elbow. Hold momentarily. Return slowly. Keep wrist stable. Repeat _10__ times each arm. Weight __1_ lbs.  ELBOW: Tricep Kickback    Sit sideways on seat; holding your weight: Hold arm bent at chest level. Straighten elbow. Keep body straight. _10__ reps per side.   Side Raise: Single Arm    Hold weight in hand with hand at side of body, palm down. Raise arm out to side, elbow straight but not locked, to 90 position with body. Hold momentarily. Return slowly. Repeat _10__ times each arm. Weight _1__ lbs.  Press: Single Arm Over Head    Hold weights, elbows bent, palms facing each other, at shoulder level. ALTERNATELY press each arm over head. Hold momentarily. Return slowly. Repeat _10__ times each arm. Weight _1__ lbs.   Shoulder Punch    In wide stance, holding _1___ pound weights at waist, twist trunk, punching toward opposite side and pull the arm back.  Quickly repeat with other arm in opposite direction. Punch __10__ times. Do _2___ sessions per day.

## 2017-10-15 ENCOUNTER — Ambulatory Visit: Payer: Medicare Other | Admitting: Speech Pathology

## 2017-10-15 DIAGNOSIS — R1312 Dysphagia, oropharyngeal phase: Secondary | ICD-10-CM | POA: Diagnosis not present

## 2017-10-15 NOTE — Therapy (Signed)
[facility] [address] [place], [place], [address] Phone: [phone]   Fax:  [phone]  Speech Language Pathology Treatment  Patient Details  Name: Angela Nicholson MRN: [id number] Date of Birth: Jan 20, 1971 Referring Provider: Kate Sable, MD   Encounter Date: 10/15/2017  End of Session - 10/15/17 1247    Visit Number  12    Number of Visits  17    Date for SLP Re-Evaluation  10/23/17    SLP Start Time  4388    SLP Stop Time   1145    SLP Time Calculation (min)  43 min    Activity Tolerance  Patient tolerated treatment well       Past Medical History:  Diagnosis Date  . Asthma   . Barrett esophagus   . Deaf   . GERD (gastroesophageal reflux disease)   . Schizo affective schizophrenia ([place])     No past surgical history on file.  There were no vitals filed for this visit.  Subjective Assessment - 10/15/17 1103    Subjective  Mom reports pt practiced HEP 2 or 3 times so far this week.    Patient is accompained by:  Family member;Interpreter    Currently in Pain?  No/denies            ADULT SLP TREATMENT - 10/15/17 1102      General Information   Behavior/Cognition  Alert;Cooperative;Pleasant mood    Patient Positioning  Upright in chair      Treatment Provided   Treatment provided  Dysphagia      Dysphagia Treatment   Temperature Spikes Noted  No    Respiratory Status  Room air    Oral Cavity - Dentition  Adequate natural dentition    Treatment Methods  Therapeutic exercise;Patient/caregiver education    Patient observed directly with PO's  No    Type of cueing  Tactile;Visual    Amount of cueing  Maximal    Other treatment/comments  SLP worked with pt on [place], [place], Effortful swallow and tongue retraction. SLP provided max verbal and tactile cues for [facility]; pt successfully identified laryngeal elevation but was unable to initiate hold. SLP allowed pt to feel clinician's  throat during [facility] and pt able to explain the difference between a "normal swallow" and the maneuver, but she had trouble initiating a swallow in subsequent attempts; suspect she may have been fatigued due to effort. SLP allowed pt to rest briefly, provided educational videos to assist pt with completing exercises at home. Pt is able to perform Masako with minimal lingual protrusion; completed 3 repetitions. Pt began asking about effortful swallow (tongue press), questioning if she is supposed to "curl her tongue." Pt demo'd tongue press using tongue tip only; SLP provided tactile cues (tongue depressor to cue posterior lingual elevation) and visual cues (drawing, image of tongue) and pt completed 10 repetitions successfully. With tongue pull-backs, pt noted to pulse her tongue vs pull and hold. To aid pt's proprioception in this exercise, SLP demo'd with pt providing resistance to clinician's tongue, which did appear to improve her understanding of the technique.       Pain Assessment   Pain Assessment  No/denies pain      Assessment / Recommendations / Plan   Plan  Continue with current plan of care      Progression Toward Goals   Progression toward goals  Progressing toward goals       SLP Education - 10/15/17 1246  Education provided  Yes    Education Details  videos for [employer]) Educated  Patient;Parent(s)    Methods  Explanation;Demonstration;Tactile cues;Handout    Comprehension  Verbalized understanding;Tactile cues required;Need further instruction       SLP Short Term Goals - 10/15/17 1247      SLP SHORT TERM GOAL #1   Title  pt will demo swallowing HEP with occasional min A over three sessions    Status  Not Met      SLP SHORT TERM GOAL #2   Title  pt will tell SLP 3 overt s/s aspiration PNA with rare min A over two sessions    Status  Deferred       SLP Long Term Goals - 10/15/17 1247      SLP LONG TERM GOAL #1   Title  pt will  complete HEP at least 6/7 days/week, reported by mother and/or pt    Time  2    Period  Weeks    Status  On-going      SLP LONG TERM GOAL #2   Title  pt will complete swallowing HEP with occasional mod A    Time  2    Period  Weeks    Status  On-going      SLP LONG TERM GOAL #3   Title  pt will tell SLP 3 overt s/s aspiration PNA in 3 therapy sessions    Time  2    Period  Weeks    Status  On-going       Plan - 10/15/17 1247    Clinical Impression Statement  Pt requires cont'd max A for HEP for dysphagia. See "skilled interventions" for more details. SLP cont to question pt fatigue and/or pt body awareness resulting in inability to achieve [facility]. Continue skilled ST for possible, eventual pleasure feeds safely.     Speech Therapy Frequency  2x / week    Treatment/Interventions  Aspiration precaution training;Pharyngeal strengthening exercises;Internal/external aids;Multimodal communcation approach;Patient/family education;Compensatory strategies;SLP instruction and feedback    Potential to Achieve Goals  Fair    Potential Considerations  Severity of impairments;Previous level of function;Ability to learn/carryover information    Consulted and Agree with Plan of Care  Patient;Family member/caregiver    Family Member Consulted  mom       Patient will benefit from skilled therapeutic intervention in order to improve the following deficits and impairments:   Oropharyngeal dysphagia    Problem List Patient Active Problem List   Diagnosis Date Noted  . Asthma with status asthmaticus 07/13/2013  . Hypothyroidism 07/13/2013  . Cerebral palsy ([place]) 07/13/2013  . Schizo affective schizophrenia ([place]) 07/13/2013  . 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 Anxiety 07/13/2013  . Status asthmaticus 07/13/2013   Deneise Lever, [place], [place] Speech-Language Pathologist  Aliene Altes 10/15/2017, 12:48 PM  [facility] [address] [place], [place], [address] Phone: [phone]   Fax:  [phone]   Name: Angela Nicholson MRN: [id number] Date of Birth: 07/16/1971

## 2017-10-15 NOTE — Patient Instructions (Signed)
[place] Maneuver video:   [email or web address].[email or web address]://www.youtube.com/watch?v=[email or web address]_A   Masako Swallow video:   Https://youtu.be/[email or web address]    Effortful swallow    Press whole tongue. Hold 3 seconds. Then swallow HARD.

## 2017-10-16 ENCOUNTER — Ambulatory Visit: Payer: Medicare Other

## 2017-10-16 DIAGNOSIS — R1312 Dysphagia, oropharyngeal phase: Secondary | ICD-10-CM | POA: Diagnosis not present

## 2017-10-16 NOTE — Patient Instructions (Signed)
Signs of Aspiration Pneumonia   . Chest pain/tightness . Fever (can be low grade) . Cough  o With foul-smelling phlegm (sputum) o With sputum containing pus or blood o With greenish sputum . Fatigue  . Shortness of breath  . Wheezing   **IF YOU HAVE THESE SIGNS, CONTACT YOUR DOCTOR OR GO TO THE EMERGENCY DEPARTMENT OR URGENT CARE AS SOON AS POSSIBLE**      

## 2017-10-16 NOTE — Therapy (Signed)
[facility] [address] [place], [place], [address] Phone: [phone]   Fax:  [phone]  Speech Language Pathology Treatment  Patient Details  Name: Angela Nicholson MRN: [id number] Date of Birth: Nov 01, 1970 Referring Provider: Kate Sable, MD   Encounter Date: 10/16/2017  End of Session - 10/16/17 1717    Visit Number  13    Number of Visits  17    Date for SLP Re-Evaluation  10/23/17    SLP Start Time  68    SLP Stop Time   1055    SLP Time Calculation (min)  36 min    Activity Tolerance  Patient tolerated treatment well       Past Medical History:  Diagnosis Date  . Asthma   . Barrett esophagus   . Deaf   . GERD (gastroesophageal reflux disease)   . Schizo affective schizophrenia ([place])     History reviewed. No pertinent surgical history.  There were no vitals filed for this visit.  Subjective Assessment - 10/15/17 1103    Subjective  Mom reports pt practiced HEP 2 or 3 times so far this week.    Patient is accompained by:  Family member;Interpreter    Currently in Pain?  No/denies            ADULT SLP TREATMENT - 10/16/17 1712      General Information   Behavior/Cognition  Alert;Cooperative;Pleasant mood      Treatment Provided   Treatment provided  Dysphagia      Dysphagia Treatment   Temperature Spikes Noted  No    Respiratory Status  Room air    Oral Cavity - Dentition  Adequate natural dentition    Treatment Methods  Therapeutic exercise;Patient/caregiver education    Patient observed directly with PO's  No    Type of cueing  Visual;Verbal    Amount of cueing  Moderate to maximal    Other treatment/comments  SLP worked with pt via interpreter with her swallow HEP. Effortful swallow was copmleted WNL - pt demo'd more than just tongue tip to hard palate. Lingual retraction with gauze was initially done with SLP demo, and then SLP palpating pt's tongue with gauze as she  completed exercise - SLP provided guidance with hold time and then cued pt to relax before retracting again. Pt was successful with this after this guidance, however her times were still confused, but pt with definite retract time and relax time, repeatedly. SLP referred to [employer] provided yesterday by SLP Camie Patience) and mother stated she had not had time to view with pt yet but would do so tonight. SLP told pt/mother that another MBSS would likely be the recommendation after another approx 3 weeks of therapy.SLP provided pt/mother overt s/s aspiration PNA.      Assessment / Recommendations / Plan   Plan  Continue with current plan of care      Progression Toward Goals   Progression toward goals  Progressing toward goals       SLP Education - 10/16/17 1717    Education provided  Yes    Education Details  HEP procedure    Person(s) Educated  Patient;Parent(s)    Methods  Explanation;Demonstration;Verbal cues    Comprehension  Verbalized understanding;Returned demonstration;Need further instruction;Verbal cues required       SLP Short Term Goals - 10/15/17 1247      SLP SHORT TERM GOAL #1   Title  pt will demo swallowing HEP with occasional min  A over three sessions    Status  Not Met      SLP SHORT TERM GOAL #2   Title  pt will tell SLP 3 overt s/s aspiration PNA with rare min A over two sessions    Status  Deferred       SLP Long Term Goals - 10/16/17 1719      SLP LONG TERM GOAL #1   Title  pt will complete HEP at least 6/7 days/week, reported by mother and/or pt    Status  Achieved      SLP LONG TERM GOAL #2   Title  pt will complete swallowing HEP with occasional mod A    Time  2    Period  Weeks    Status  On-going      SLP LONG TERM GOAL #3   Title  pt will tell SLP 3 overt s/s aspiration PNA in 3 therapy sessions    Baseline  provided 10-16-17    Time  2    Period  Weeks    Status  On-going       Plan - 10/16/17 1718    Clinical Impression Statement  Pt  requires cont'd max A for HEP for dysphagia. See "skilled interventions" for more details. Pt will require follow up MBSS in the future, after d/c from [place] in (likely) approx 3 weeks. Continue skilled ST for possible, eventual pleasure feeds safely.     Speech Therapy Frequency  2x / week    Duration  -- 8 weeks/17 visits    Treatment/Interventions  Aspiration precaution training;Pharyngeal strengthening exercises;Internal/external aids;Multimodal communcation approach;Patient/family education;Compensatory strategies;SLP instruction and feedback    Potential to Achieve Goals  Fair    Potential Considerations  Severity of impairments;Previous level of function;Ability to learn/carryover information    Consulted and Agree with Plan of Care  Patient;Family member/caregiver    Family Member Consulted  mom       Patient will benefit from skilled therapeutic intervention in order to improve the following deficits and impairments:   Oropharyngeal dysphagia    Problem List Patient Active Problem List   Diagnosis Date Noted  . Asthma with status asthmaticus 07/13/2013  . Hypothyroidism 07/13/2013  . Cerebral palsy ([place]) 07/13/2013  . Schizo affective schizophrenia ([place]) 07/13/2013  . 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 Anxiety 07/13/2013  . Status asthmaticus 07/13/2013    [facility] ,MS, CCC-SLP  10/16/2017, 5:24 PM  [facility] [address] [place], [place], [address] Phone: [phone]   Fax:  [phone]   Name: Angela Nicholson MRN: [id number] Date of Birth: 08/03/1970

## 2017-10-23 ENCOUNTER — Encounter: Payer: Self-pay | Admitting: Physical Therapy

## 2017-10-23 ENCOUNTER — Ambulatory Visit: Payer: Medicare Other | Admitting: Physical Therapy

## 2017-10-23 ENCOUNTER — Ambulatory Visit: Payer: Medicare Other | Attending: Family Medicine

## 2017-10-23 DIAGNOSIS — G804 Ataxic cerebral palsy: Secondary | ICD-10-CM | POA: Diagnosis present

## 2017-10-23 DIAGNOSIS — R2681 Unsteadiness on feet: Secondary | ICD-10-CM | POA: Insufficient documentation

## 2017-10-23 DIAGNOSIS — R1312 Dysphagia, oropharyngeal phase: Secondary | ICD-10-CM | POA: Insufficient documentation

## 2017-10-23 DIAGNOSIS — M6281 Muscle weakness (generalized): Secondary | ICD-10-CM | POA: Diagnosis present

## 2017-10-23 DIAGNOSIS — R260 Ataxic gait: Secondary | ICD-10-CM | POA: Insufficient documentation

## 2017-10-23 NOTE — Therapy (Signed)
[facility] [address] [place], [place], [address] Phone: [phone]   Fax:  [phone]  Speech Language Pathology Treatment  Patient Details  Name: Angela Nicholson MRN: [id number] Date of Birth: Nov 01, 1970 Referring Provider: Kate Sable, MD   Encounter Date: 10/23/2017  End of Session - 10/23/17 1553    Visit Number  14    Number of Visits  17    Date for SLP Re-Evaluation  10/23/17    SLP Start Time  1447    SLP Stop Time   1516    SLP Time Calculation (min)  29 min    Activity Tolerance  Patient tolerated treatment well       Past Medical History:  Diagnosis Date  . Asthma   . Barrett esophagus   . Deaf   . GERD (gastroesophageal reflux disease)   . Schizo affective schizophrenia ([place])     History reviewed. No pertinent surgical history.  There were no vitals filed for this visit.  Subjective Assessment - 10/23/17 1443    Patient is accompained by:  Family member;Interpreter mom    Currently in Pain?  No/denies            ADULT SLP TREATMENT - 10/23/17 1545      General Information   Behavior/Cognition  Alert;Cooperative;Pleasant mood    Patient Positioning  Upright in chair      Dysphagia Treatment   Temperature Spikes Noted  No    Respiratory Status  Room air    Oral Cavity - Dentition  Adequate natural dentition    Treatment Methods  Therapeutic exercise;Patient/caregiver education    Patient observed directly with PO's  No    Other treatment/comments  Pt performed HEP for dysaphagia with rare min A for Masako; pt achieved once on three attempts with extended time. Mendelsohn completed correctly today x3. Pt performed three of all other exercises except towel hold with independence. SLP explained how Masako and gauze hold affected the base of tongue and explained base of tongue role in swallowing using pharyngeal model. Told pt/mother that expect to d/c after a couple/few more  visits, adn then MBSS recommendation in approx 6-8 weeks due to correct MEndelsohn at this time.      Assessment / Recommendations / Plan   Plan  Continue with current plan of care      Dysphagia Recommendations   Diet recommendations  NPO      Progression Toward Goals   Progression toward goals  Progressing toward goals       SLP Education - 10/23/17 1553    Education provided  Yes    Education Details  base of tongue role in swallowing, overt s/s aspiration PNA    Person(s) Educated  Patient;Parent(s)    Methods  Explanation;Demonstration;Verbal cues    Comprehension  Verbalized understanding       SLP Short Term Goals - 10/15/17 1247      SLP SHORT TERM GOAL #1   Title  pt will demo swallowing HEP with occasional min A over three sessions    Status  Not Met      SLP SHORT TERM GOAL #2   Title  pt will tell SLP 3 overt s/s aspiration PNA with rare min A over two sessions    Status  Deferred       SLP Long Term Goals - 10/23/17 1444      SLP LONG TERM GOAL #1   Title  pt will complete  HEP at least 6/7 days/week, reported by mother and/or pt    Status  Achieved      SLP LONG TERM GOAL #2   Title  pt will complete swallowing HEP with occasional mod A    Time  --    Period  --    Status  Achieved      SLP LONG TERM GOAL #3   Title  pt will tell SLP 3 overt s/s aspiration PNA in 3 therapy sessions    Baseline  provided 10-16-17    Time  2    Period  Weeks    Status  On-going      SLP LONG TERM GOAL #4   Title  pt will require rare min A for HEP in 3 sessions    Time  2    Period  Weeks or visits    Status  New       Plan - 10/23/17 1554    Clinical Impression Statement  Pt requires cont'd SLP A rarely for HEP. See "skilled interventions" for more details. Pt will require follow up MBSS in the future, after d/c from [place] in (likely) approx 2 visits. Continue skilled ST for possible, eventual pleasure feeds safely.     Speech Therapy Frequency  1x /week     Duration  -- 8 weeks /17 visits    Treatment/Interventions  Aspiration precaution training;Pharyngeal strengthening exercises;Internal/external aids;Multimodal communcation approach;Patient/family education;Compensatory strategies;SLP instruction and feedback    Potential to Achieve Goals  Fair    Potential Considerations  Severity of impairments;Previous level of function;Ability to learn/carryover information    Consulted and Agree with Plan of Care  Patient;Family member/caregiver       Patient will benefit from skilled therapeutic intervention in order to improve the following deficits and impairments:   Oropharyngeal dysphagia    Problem List Patient Active Problem List   Diagnosis Date Noted  . Asthma with status asthmaticus 07/13/2013  . Hypothyroidism 07/13/2013  . Cerebral palsy ([place]) 07/13/2013  . Schizo affective schizophrenia ([place]) 07/13/2013  . 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 Anxiety 07/13/2013  . Status asthmaticus 07/13/2013    [facility] ,MS, CCC-SLP  10/23/2017, 3:58 PM  [facility] [address] [place], [place], [address] Phone: [phone]   Fax:  [phone]   Name: Angela Nicholson MRN: [id number] Date of Birth: [birth year]

## 2017-10-23 NOTE — Therapy (Signed)
[facility] [address] [place], [place], [address] Phone: [phone]   Fax:  [phone]  Physical Therapy Treatment and D/C Summary  Patient Details  Name: Angela Nicholson MRN: [id number] Date of Birth: [birth year] Referring Provider: Kip A. Corrington, MD   Encounter Date: 10/23/2017  PT End of Session - 10/23/17 1441    Visit Number  24    Number of Visits  24    Date for PT Re-Evaluation  10/21/17    Authorization Type  Medicare now primary; visits added.      PT Start Time  1400    PT Stop Time  1441    PT Time Calculation (min)  41 min    Equipment Utilized During Treatment  -- only during rocker board tasks    Activity Tolerance  Patient tolerated treatment well    Behavior During Therapy  WFL for tasks assessed/performed       Past Medical History:  Diagnosis Date  . Asthma   . Barrett esophagus   . Deaf   . GERD (gastroesophageal reflux disease)   . Schizo affective schizophrenia ([place])     History reviewed. No pertinent surgical history.  There were no vitals filed for this visit.  Subjective Assessment - 10/23/17 1404    Subjective  Pt crying, "this is my last day, I'm going to miss you."  She did a Silver Sneakers class and really like it.  Also did the Nustep for warm up!    Patient is accompained by:  Family member;Interpreter    Pertinent History  ataxic cerebral palsy, deaf, PEG tube, aspiration PNA, and asthma    Limitations  Walking;Other (comment)    Patient Stated Goals  To get stronger and to eat with her mouth again.    Currently in Pain?  No/denies         [facility] PT Assessment - 10/23/17 1415      Transfers   Transfers  Sit to Stand;Stand to Sit    Stand to Sit  5: Supervision    Stand to Sit Details  focus on isometric hold to improve eccentric control when lowering to low seat; use of counting outloud for increased eccentric control    Number of Reps  10 reps       Ambulation/Gait   Stairs  Yes    Stairs Assistance  4: Min guard    Stair Management Technique  Two rails;Alternating pattern;No rails;Forwards    Number of Stairs  20    Height of Stairs  6      6 Minute Walk- Baseline   6 Minute Walk- Baseline  yes    BP (mmHg)  102/65    HR (bpm)  68    02 Sat (%RA)  96 %    Modified Borg Scale for Dyspnea  0- Nothing at all    Perceived Rate of Exertion (Borg)  6-      6 Minute walk- Post Test   6 Minute Walk Post Test  yes    BP (mmHg)  97/65    HR (bpm)  70    02 Sat (%RA)  90 %    Modified Borg Scale for Dyspnea  0.5- Very, very slight shortness of breath    Perceived Rate of Exertion (Borg)  13- Somewhat hard      6 minute walk test results    Aerobic Endurance Distance Walked  [id number]    Endurance additional comments  in 10  minutes                             PT Short Term Goals - 08/23/17 1432      PT SHORT TERM GOAL #1   Title  = LTG        PT Long Term Goals - 10/23/17 1407      PT LONG TERM GOAL #1   Title  Pt will participate in HEP, walking program and community wellness at [facility] 3-4/7 days per week with mother    Time  8    Period  Weeks    Status  Achieved      PT LONG TERM GOAL #3   Title  The patient will ambulate nonstop x 10 minutes in order to improve community ambulation.     Baseline  --    Time  8    Period  Weeks    Status  Achieved      PT LONG TERM GOAL #4   Title  Patient will negotiate 8 stairs without use of rails and supervision    Time  8    Period  Weeks    Status  Partially Met      PT LONG TERM GOAL #5   Title  The patient will negotiate community surfaces (parking lots, curb, grass) without UE support x 1000 ft for improved community negotiation.    Time  8    Period  Weeks    Status  Achieved            Plan - 10/23/17 1441    Clinical Impression Statement  Completed assessment of LTG and reviewed exercises on HEP that pt continues to have difficulty with.   Pt met 3/4 LTG with pt demonstrating significant improvement in strength, posture, endurance and balance.  Pt partially met stair goal; able to descend without UE support but with decreased stability and safety; recommending pt continue to use rail when descending alternating sequence.  Mother reports pt is now able to walk outside and in the community without holding on to her.  Pt agreeable to D/C today; will continue with community wellness at [facility]    PT Treatment/Interventions  ADLs/Self Care Home Management;Gait training;Stair training;Functional mobility training;Therapeutic activities;Therapeutic exercise;Balance training;Neuromuscular re-education;Patient/family education;Energy conservation    PT Next Visit Plan  LTG and D/C    Consulted and Agree with Plan of Care  Patient;Family member/caregiver    Family Member Consulted  Mother       Patient will benefit from skilled therapeutic intervention in order to improve the following deficits and impairments:  Impaired flexibility, Decreased balance, Decreased activity tolerance, Decreased endurance, Decreased strength, Postural dysfunction, Abnormal gait  Visit Diagnosis: Muscle weakness (generalized)  Unsteadiness on feet  Ataxic cerebral palsy (HCC)  Ataxic gait     Problem List Patient Active Problem List   Diagnosis Date Noted  . Asthma with status asthmaticus 07/13/2013  . Hypothyroidism 07/13/2013  . Cerebral palsy ([place]) 07/13/2013  . Schizo affective schizophrenia ([place]) 07/13/2013  . 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 Anxiety 07/13/2013  . Status asthmaticus 07/13/2013    PHYSICAL THERAPY DISCHARGE SUMMARY  Visits from Start of Care: 24  Current functional level related to goals / functional outcomes: See LTG and impression statement above   Remaining deficits: Impaired balance, weakness, gait   Education /  Equipment: HEP  Plan: Patient agrees to discharge.  Patient goals were met. Patient is being discharged due to meeting the stated rehab goals.  ?????    Rico Junker, PT, DPT 10/23/17    2:46 PM     [facility] [address] [place], [place], [address] Phone: [phone]   Fax:  [phone]  Name: Angela Nicholson MRN: [id number] Date of Birth: [birth year]

## 2017-10-23 NOTE — Patient Instructions (Signed)
Signs of Aspiration Pneumonia   . Chest pain/tightness . Fever (can be low grade) . Cough  o With foul-smelling phlegm (sputum) o With sputum containing pus or blood o With greenish sputum . Fatigue  . Shortness of breath  . Wheezing   **IF YOU HAVE THESE SIGNS, CONTACT YOUR DOCTOR OR GO TO THE EMERGENCY DEPARTMENT OR URGENT CARE AS SOON AS POSSIBLE**      

## 2017-10-26 ENCOUNTER — Ambulatory Visit: Payer: Medicare Other | Admitting: Speech Pathology

## 2017-10-26 DIAGNOSIS — R1312 Dysphagia, oropharyngeal phase: Secondary | ICD-10-CM

## 2017-10-26 NOTE — Patient Instructions (Signed)
SWALLOWING EXERCISES Do one repetition of each exercise (2-3 of gauze hold and straw suck) and then move to the next exercise  1. Effortful Swallows - Press your tongue against the roof of your mouth for 3 seconds, then squeeze the muscles in your neck while you swallow your saliva or a sip of water - Repeat 20 times, 2-3 times a day, and use whenever you eat or drink  2. Masako Swallow - swallow with your tongue sticking out - Stick tongue out past your teeth and gently bite tongue with your teeth - Swallow, while holding your tongue with your teeth - Repeat 10-15 times, 2-3 times a day *use a wet spoon if your mouth gets dry*  3. Gauze hold - hold your tongue with a piece of gauze, pull your tongue in your mouth for 1 second - Repeat 10-15 times, 2-3 times a day  4. Shaker Exercise - head lift - Lie flat on your back in your bed or on a couch without pillows - Raise your head and look at your feet - KEEP YOUR SHOULDERS DOWN - HOLD FOR 45-60 SECONDS, then lower your head back down - Repeat 3 times, 2-3 times a day  5. Mendelsohn Maneuver - "half swallow" exercise - Start to swallow, and keep your Adam's apple up by squeezing hard with the            muscles of the throat - Hold the squeeze for 5-7 seconds and then relax - Repeat 20 times, 2-3 times a day *use a wet spoon if your mouth gets dry*  6. Towel hold - Roll up a towel to about 4" in diameter and hold underneath your chin - press you chin down against the towel for 45-60 seconds - Repeat 3 times, 2-3 times a day

## 2017-10-26 NOTE — Therapy (Signed)
[facility] [address] [place], [place], [address] Phone: [phone]   Fax:  [phone]  Speech Language Pathology Treatment  Patient Details  Name: Angela Nicholson MRN: [id number] Date of Birth: [birth year] Referring Provider: Kate Sable, MD   Encounter Date: 10/26/2017  End of Session - 10/26/17 1529    Visit Number  15    Number of Visits  17    Date for SLP Re-Evaluation  11/20/17    SLP Start Time  0850    SLP Stop Time   0930    SLP Time Calculation (min)  40 min    Activity Tolerance  Patient tolerated treatment well       Past Medical History:  Diagnosis Date  . Asthma   . Barrett esophagus   . Deaf   . GERD (gastroesophageal reflux disease)   . Schizo affective schizophrenia ([place])     No past surgical history on file.  There were no vitals filed for this visit.  Subjective Assessment - 10/26/17 0850    Subjective  Pt points to Masako exercise, signing, "This is hard."    Patient is accompained by:  Family member;Interpreter    Currently in Pain?  No/denies            ADULT SLP TREATMENT - 10/26/17 0850      General Information   Behavior/Cognition  Alert;Cooperative;Pleasant mood    Patient Positioning  Upright in chair      Treatment Provided   Treatment provided  Dysphagia      Dysphagia Treatment   Temperature Spikes Noted  No    Respiratory Status  Room air    Oral Cavity - Dentition  Adequate natural dentition    Treatment Methods  Therapeutic exercise;Patient/caregiver education    Patient observed directly with PO's  No    Other treatment/comments  Pt performed HEP for dysphagia; rare min A for Masako which pt achieved in 2/4 attempts with extended time. Tongue pull-backs completed with improved technique since last visit with this therapist, performed with independence. Despite completing Mendelsohn correctly during prior session, pt was unsuccessful with attempts  today despite max cues, with video demonstration, SLP instruction via [place] interpreter (different interpreter today), and tactile, demonstration cues. Pt appears to be holding her breath before the swallow, with no palpable movement of voice box. Pt is independent with Shaker exercise; Mom reports pt completing this at home inconsistently. SLP encouraged pt/mom to perform Shaker regularly, 3x per day, as this exercise also targets hyolaryngeal excursion, since [place] is inconsistent.      Pain Assessment   Pain Assessment  No/denies pain      Assessment / Recommendations / Plan   Plan  Continue with current plan of care      Dysphagia Recommendations   Diet recommendations  -- NPO      Progression Toward Goals   Progression toward goals  Progressing toward goals      General Information   Reason PO's not observed  Other (comment) therapeutic exercise       SLP Education - 10/26/17 1528    Education provided  Yes    Education Details  perform Shaker exercise for hyolaryngeal excursion 3x per day, particularly if having difficulty with [employer]) Educated  Patient;Parent(s)    Methods  Explanation;Tactile cues;Verbal cues    Comprehension  Verbalized understanding       SLP Short Term Goals - 10/26/17 1530  SLP SHORT TERM GOAL #1   Title  pt will demo swallowing HEP with occasional min A over three sessions    Status  Not Met      SLP SHORT TERM GOAL #2   Title  pt will tell SLP 3 overt s/s aspiration PNA with rare min A over two sessions    Status  Deferred       SLP Long Term Goals - 10/26/17 1530      SLP LONG TERM GOAL #1   Title  pt will complete HEP at least 6/7 days/week, reported by mother and/or pt    Status  Achieved      SLP LONG TERM GOAL #2   Title  pt will complete swallowing HEP with occasional mod A    Status  Achieved      SLP LONG TERM GOAL #3   Title  pt will tell SLP 3 overt s/s aspiration PNA in 3 therapy sessions    Time  1     Period  Weeks    Status  On-going      SLP LONG TERM GOAL #4   Title  pt will require rare min A for HEP in 3 sessions    Time  1    Period  Weeks    Status  On-going       Plan - 10/26/17 1529    Clinical Impression Statement  Pt requires cont'd SLP A rarely for most of HEP, though performance of Mendelsohn not successful this date. See "skilled interventions" for more details. Pt will require follow up MBSS in the future, after d/c from [place] in (likely) approx 1-2 visits. Continue skilled ST for possible, eventual pleasure feeds safely.     Speech Therapy Frequency  1x /week    Treatment/Interventions  Aspiration precaution training;Pharyngeal strengthening exercises;Internal/external aids;Multimodal communcation approach;Patient/family education;Compensatory strategies;SLP instruction and feedback    Potential to Achieve Goals  Fair    Potential Considerations  Severity of impairments;Previous level of function;Ability to learn/carryover information    Consulted and Agree with Plan of Care  Patient;Family member/caregiver    Family Member Consulted  mom       Patient will benefit from skilled therapeutic intervention in order to improve the following deficits and impairments:   Dysphagia, oropharyngeal phase    Problem List Patient Active Problem List   Diagnosis Date Noted  . Asthma with status asthmaticus 07/13/2013  . Hypothyroidism 07/13/2013  . Cerebral palsy ([place]) 07/13/2013  . Schizo affective schizophrenia ([place]) 07/13/2013  . 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 Anxiety 07/13/2013  . Status asthmaticus 07/13/2013   Deneise Lever, [place], [place] Speech-Language Pathologist   Aliene Altes 10/26/2017, 3:31 PM  [facility] [address] [place], [place], [address] Phone: [phone]   Fax:  [phone]   Name:  Adisyn Ruscitti MRN: [id number] Date of Birth: October 05, 1970

## 2017-10-28 ENCOUNTER — Ambulatory Visit: Payer: Medicare Other

## 2017-10-28 DIAGNOSIS — R1312 Dysphagia, oropharyngeal phase: Secondary | ICD-10-CM

## 2017-10-28 NOTE — Therapy (Signed)
[facility] [address] [place], [place], [address] Phone: [phone]   Fax:  [phone]  Speech Language Pathology Treatment  Patient Details  Name: Angela Nicholson MRN: [id number] Date of Birth: 09/28/1970 Referring Provider: Kate Sable, MD   Encounter Date: 10/28/2017  End of Session - 10/28/17 1016    Visit Number  16    Number of Visits  17    Date for SLP Re-Evaluation  11/20/17    SLP Start Time  0850    SLP Stop Time   0931    SLP Time Calculation (min)  41 min    Activity Tolerance  Patient tolerated treatment well       Past Medical History:  Diagnosis Date  . Asthma   . Barrett esophagus   . Deaf   . GERD (gastroesophageal reflux disease)   . Schizo affective schizophrenia ([place])     No past surgical history on file.  There were no vitals filed for this visit.         ADULT SLP TREATMENT - 10/28/17 0929      General Information   Behavior/Cognition  Alert;Cooperative;Pleasant mood      Treatment Provided   Treatment provided  Dysphagia      Dysphagia Treatment   Temperature Spikes Noted  No    Respiratory Status  Room air    Oral Cavity - Dentition  Adequate natural dentition    Treatment Methods  Skilled observation;Therapeutic exercise;Patient/caregiver education    Patient observed directly with PO's  No    Other treatment/comments  SLP provided suggestion pt use her watch for Shaker timing. All other exercises pt was independent. Pt mother with questions re: pt continuance of HEP after d/c - SLP told pt/mother pt will need to cont HEP x3/week after d/c. Suggested timing of next swallow test as mid-late May to afford pt best possible chance at a PO diet, as SLP feels pt has been completing more of the totality of HEP correctly for approx 2-3 weeks.      Assessment / Recommendations / Plan   Plan  -- reduce to once/week due to progress      Progression Toward Goals   Progression toward goals  Progressing toward goals       SLP Education - 10/28/17 1015    Education provided  Yes    Education Details  hold time for [employer]) Educated  Patient;Parent(s)    Methods  Explanation;Verbal cues    Comprehension  Verbalized understanding;Returned demonstration       SLP Short Term Goals - 10/26/17 1530      SLP SHORT TERM GOAL #1   Title  pt will demo swallowing HEP with occasional min A over three sessions    Status  Not Met      SLP SHORT TERM GOAL #2   Title  pt will tell SLP 3 overt s/s aspiration PNA with rare min A over two sessions    Status  Deferred       SLP Long Term Goals - 10/28/17 1018      SLP LONG TERM GOAL #1   Title  pt will complete HEP at least 6/7 days/week, reported by mother and/or pt    Status  Achieved      SLP LONG TERM GOAL #2   Title  pt will complete swallowing HEP with occasional mod A    Status  Achieved  SLP LONG TERM GOAL #3   Title  pt will tell SLP 3 overt s/s aspiration PNA in 3 therapy sessions    Time  1    Period  Weeks    Status  On-going      SLP LONG TERM GOAL #4   Title  pt will require rare min A for HEP in 3 sessions    Baseline  10-26-17, 10-28-17    Time  1    Period  Weeks    Status  On-going       Plan - 10/28/17 1016    Clinical Impression Statement  Pt was independent today wtih all HEP (MEndelsohn was not assessed), with suggestion of using watch for hold time for Shaker. See "skilled interventions" for more details. Pt will reduce to once/week due to progress. Pt will require follow up MBSS in the future, after d/c from [place] in (likely) in 2 visits. Continue skilled ST for possible, eventual pleasure feeds safely.     Speech Therapy Frequency  1x /week    Duration  -- 8 weeks /17 visits    Treatment/Interventions  Aspiration precaution training;Pharyngeal strengthening exercises;Internal/external aids;Multimodal communcation approach;Patient/family education;Compensatory  strategies;SLP instruction and feedback    Potential to Achieve Goals  Fair    Potential Considerations  Severity of impairments;Previous level of function;Ability to learn/carryover information    Consulted and Agree with Plan of Care  Patient;Family member/caregiver       Patient will benefit from skilled therapeutic intervention in order to improve the following deficits and impairments:   Dysphagia, oropharyngeal phase    Problem List Patient Active Problem List   Diagnosis Date Noted  . Asthma with status asthmaticus 07/13/2013  . Hypothyroidism 07/13/2013  . Cerebral palsy ([place]) 07/13/2013  . Schizo affective schizophrenia ([place]) 07/13/2013  . 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 Anxiety 07/13/2013  . Status asthmaticus 07/13/2013    [facility] ,MS, CCC-SLP  10/28/2017, 10:19 AM  [facility] [address] [place], [place], [address] Phone: [phone]   Fax:  [phone]   Name: Angela Nicholson MRN: [id number] Date of Birth: 1971/05/08

## 2017-11-04 ENCOUNTER — Ambulatory Visit: Payer: Medicare Other

## 2017-11-04 DIAGNOSIS — R1312 Dysphagia, oropharyngeal phase: Secondary | ICD-10-CM

## 2017-11-04 NOTE — Patient Instructions (Signed)
MAke sure you get 40-45 repetitions per day of hard swallow, tongue out swallow, and adam's apple swallow

## 2017-11-04 NOTE — Therapy (Signed)
[facility] [address] [place], [place], [address] Phone: [phone]   Fax:  [phone]  Speech Language Pathology Treatment  Patient Details  Name: Angela Nicholson MRN: [id number] Date of Birth: [birth year] Referring Provider: Kate Sable, MD   Encounter Date: 11/04/2017  End of Session - 11/04/17 1530    Visit Number  17    Number of Visits  23    Date for SLP Re-Evaluation  12/17/17    SLP Start Time  0852    SLP Stop Time   0933    SLP Time Calculation (min)  41 min    Activity Tolerance  Patient tolerated treatment well       Past Medical History:  Diagnosis Date  . Asthma   . Barrett esophagus   . Deaf   . GERD (gastroesophageal reflux disease)   . Schizo affective schizophrenia ([place])     History reviewed. No pertinent surgical history.  There were no vitals filed for this visit.         ADULT SLP TREATMENT - 11/04/17 1523      General Information   Behavior/Cognition  Alert;Cooperative;Pleasant mood      Treatment Provided   Treatment provided  Dysphagia      Dysphagia Treatment   Temperature Spikes Noted  No    Respiratory Status  Room air    Oral Cavity - Dentition  Adequate natural dentition    Treatment Methods  Skilled observation;Therapeutic exercise;Patient/caregiver education    Patient observed directly with PO's  No    Other treatment/comments  Pt performed HEP for dysphagia with occasional min A for tongue pull and towel hold, and pt independence with Masako (even though challenging for pt to copmlete), Mendelsohn, effortful swallow. SLP decided today pt is not ready for modified (MBSS) in next two-three weeks, likely another 6 weeks due to difficulty with Masako, tongue pull and incorrect procedure with towel hold. SLP demonstrated all exercises and educated pt re: wet spoon - pt achieved swallows more readily and more frequently with wet spoon with mendelsohn and  effortful swallow. Masako still very difficult for pt to compelte. SLP explained pt needs 40-45 reps of MAsako, mendelsohn, effortful swallow, adn tongue pull.       Assessment / Recommendations / Plan   Plan  Continue with current plan of care      Dysphagia Recommendations   Diet recommendations  Other(comment) modified after approx 6 weeks      Progression Toward Goals   Progression toward goals  Progressing toward goals       SLP Education - 11/04/17 1529    Education provided  Yes    Education Details  wet spoon, towel hold    Person(s) Educated  Patient;Parent(s)    Methods  Explanation;Demonstration;Verbal cues    Comprehension  Verbalized understanding;Returned demonstration;Verbal cues required       SLP Short Term Goals - 10/26/17 1530      SLP SHORT TERM GOAL #1   Title  pt will demo swallowing HEP with occasional min A over three sessions    Status  Not Met      SLP SHORT TERM GOAL #2   Title  pt will tell SLP 3 overt s/s aspiration PNA with rare min A over two sessions    Status  Deferred       SLP Long Term Goals - 11/04/17 1639      SLP LONG TERM GOAL #1   Title  pt will complete HEP at least 6/7 days/week, reported by mother and/or pt    Status  Achieved      SLP LONG TERM GOAL #2   Title  pt will complete swallowing HEP with occasional mod A    Status  Achieved      SLP LONG TERM GOAL #3   Title  pt will tell SLP 3 overt s/s aspiration PNA in 3 therapy sessions    Time  6    Period  Weeks    Status  On-going      SLP LONG TERM GOAL #4   Title  pt will require rare min A for HEP in 4 sessions    Baseline  10-26-17, 10-28-17    Time  6    Period  Weeks    Status  On-going       Plan - 11/04/17 1532    Clinical Impression Statement  Pt req'd assistance today with towel hold as she was not holding with effort, and assistance with tongue pull as she was not pulling tongue back in mouth. Due to this, pt will need x1/week for approx 6 more weeks for  SLP to monitor pt performance with HEP. Pt will require follow up MBSS in approx 6 weeks, after d/c from [place]. Continue skilled ST for cont'd dysphagia, and eventually for pt to achieve safety for pleasure feeds.    Speech Therapy Frequency  1x /week    Duration  -- 6 weeks /23 total visits    Treatment/Interventions  Aspiration precaution training;Pharyngeal strengthening exercises;Internal/external aids;Multimodal communcation approach;Patient/family education;Compensatory strategies;SLP instruction and feedback    Potential to Achieve Goals  Fair    Potential Considerations  Severity of impairments;Previous level of function;Ability to learn/carryover information    Consulted and Agree with Plan of Care  Patient;Family member/caregiver       Patient will benefit from skilled therapeutic intervention in order to improve the following deficits and impairments:   Dysphagia, oropharyngeal phase    Problem List Patient Active Problem List   Diagnosis Date Noted  . Asthma with status asthmaticus 07/13/2013  . Hypothyroidism 07/13/2013  . Cerebral palsy ([place]) 07/13/2013  . Schizo affective schizophrenia ([place]) 07/13/2013  . 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 Anxiety 07/13/2013  . Status asthmaticus 07/13/2013    [facility] ,MS, CCC-SLP  11/04/2017, 4:39 PM  [facility] [address] [place], [place], [address] Phone: [phone]   Fax:  [phone]   Name: Angela Nicholson MRN: [id number] Date of Birth: 1971-06-20

## 2017-11-11 ENCOUNTER — Ambulatory Visit: Payer: Medicare Other

## 2017-11-11 DIAGNOSIS — R1312 Dysphagia, oropharyngeal phase: Secondary | ICD-10-CM

## 2017-11-11 NOTE — Therapy (Signed)
[facility] [address] [place], [place], [address] Phone: [phone]   Fax:  [phone]  Speech Language Pathology Treatment  Patient Details  Name: Angela Nicholson MRN: [id number] Date of Birth: [birth year] Referring Provider: Kate Sable, MD   Encounter Date: 11/11/2017  End of Session - 11/11/17 1119    Visit Number  18    Number of Visits  23    Date for SLP Re-Evaluation  12/17/17    SLP Start Time  49    SLP Stop Time   65    SLP Time Calculation (min)  34 min    Activity Tolerance  Patient tolerated treatment well       Past Medical History:  Diagnosis Date  . Asthma   . Barrett esophagus   . Deaf   . GERD (gastroesophageal reflux disease)   . Schizo affective schizophrenia ([place])     No past surgical history on file.  There were no vitals filed for this visit.         ADULT SLP TREATMENT - 11/11/17 1105      General Information   Behavior/Cognition  Alert;Cooperative;Pleasant mood      Treatment Provided   Treatment provided  Dysphagia      Dysphagia Treatment   Temperature Spikes Noted  No    Respiratory Status  Room air    Oral Cavity - Dentition  Adequate natural dentition    Treatment Methods  Skilled observation;Therapeutic exercise;Patient/caregiver education    Patient observed directly with PO's  No    Type of cueing  Verbal;Tactile;Visual    Other treatment/comments  Initially usual mod cues needed for [facility] and Masako, faded to rare min cues. Min cues rarely necessary for gauze exercise. Mother was correctly cueing pt however pt does not always allow mother to correct her at home if pt doing exercises incorrectly. SLP used tactile cueing with Caryl Ada for incr'd pt success.      Assessment / Recommendations / Plan   Plan  Continue with current plan of care      Progression Toward Goals   Progression toward goals  Progressing toward goals pt continues to  require SLP support for HEP procedure       SLP Education - 11/11/17 1119    Education provided  Yes    Education Details  procedure for HEP    Person(s) Educated  Patient;Parent(s)    Methods  Explanation;Demonstration;Verbal cues;Tactile cues    Comprehension  Verbalized understanding;Need further instruction;Returned demonstration;Verbal cues required;Tactile cues required       SLP Short Term Goals - 10/26/17 1530      SLP SHORT TERM GOAL #1   Title  pt will demo swallowing HEP with occasional min A over three sessions    Status  Not Met      SLP SHORT TERM GOAL #2   Title  pt will tell SLP 3 overt s/s aspiration PNA with rare min A over two sessions    Status  Deferred       SLP Long Term Goals - 11/11/17 1200      SLP LONG TERM GOAL #1   Title  pt will complete HEP at least 6/7 days/week, reported by mother and/or pt    Status  Achieved      SLP LONG TERM GOAL #2   Title  pt will complete swallowing HEP with occasional mod A    Status  Achieved      SLP  LONG TERM GOAL #3   Title  pt will tell SLP 3 overt s/s aspiration PNA in 3 therapy sessions    Time  6    Period  Weeks    Status  On-going      SLP LONG TERM GOAL #4   Title  pt will require rare min A for HEP in 4 sessions    Baseline  10-26-17, 10-28-17    Time  6    Period  Weeks    Status  On-going       Plan - 11/11/17 1120    Clinical Impression Statement  Pt cont to require assistance today with procedure for HEP; pt cont to need consistent assistance from SLP due to not always allowing mother to assist her.  Due to this, pt will need x1/week for approx 6 more weeks for SLP to monitor pt performance with HEP. Pt will require follow up MBSS in approx 6 weeks, after d/c from [place]. Continue skilled ST for cont'd dysphagia, and eventually for pt to achieve safety for pleasure feeds.    Speech Therapy Frequency  1x /week    Duration  -- 6 weeks/23 total visits    Treatment/Interventions  Aspiration precaution  training;Pharyngeal strengthening exercises;Internal/external aids;Multimodal communcation approach;Patient/family education;Compensatory strategies;SLP instruction and feedback    Potential Considerations  Severity of impairments;Previous level of function;Ability to learn/carryover information       Patient will benefit from skilled therapeutic intervention in order to improve the following deficits and impairments:   Dysphagia, oropharyngeal phase    Problem List Patient Active Problem List   Diagnosis Date Noted  . Asthma with status asthmaticus 07/13/2013  . Hypothyroidism 07/13/2013  . Cerebral palsy ([place]) 07/13/2013  . Schizo affective schizophrenia ([place]) 07/13/2013  . 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 Anxiety 07/13/2013  . Status asthmaticus 07/13/2013    [facility] ,MS, CCC-SLP  11/11/2017, 12:02 PM  [facility] [address] [place], [place], [address] Phone: [phone]   Fax:  [phone]   Name: Angela Nicholson MRN: [id number] Date of Birth: 1971/06/12

## 2017-11-18 ENCOUNTER — Ambulatory Visit: Payer: Medicare Other | Attending: Family Medicine

## 2017-11-18 DIAGNOSIS — R1312 Dysphagia, oropharyngeal phase: Secondary | ICD-10-CM

## 2017-11-18 NOTE — Therapy (Signed)
[facility] [address] [place], [place], [address] Phone: [phone]   Fax:  [phone]  Speech Language Pathology Treatment  Patient Details  Name: Angela Nicholson MRN: [id number] Date of Birth: 1970/08/09 Referring Provider: Kate Sable, MD   Encounter Date: 11/18/2017  End of Session - 11/18/17 1402    Visit Number  19    Number of Visits  23    Date for SLP Re-Evaluation  12/17/17    SLP Start Time  42    SLP Stop Time   1353    SLP Time Calculation (min)  34 min    Activity Tolerance  Patient tolerated treatment well       Past Medical History:  Diagnosis Date  . Asthma   . Barrett esophagus   . Deaf   . GERD (gastroesophageal reflux disease)   . Schizo affective schizophrenia ([place])     No past surgical history on file.  There were no vitals filed for this visit.  Subjective Assessment - 11/18/17 1355    Subjective  Pt indicates excitement about interpreter today.    Patient is accompained by:  Family member;Interpreter mother    Currently in Pain?  No/denies            ADULT SLP TREATMENT - 11/18/17 1356      General Information   Behavior/Cognition  Alert;Cooperative;Pleasant mood      Treatment Provided   Treatment provided  Dysphagia      Dysphagia Treatment   Temperature Spikes Noted  No    Respiratory Status  Room air    Oral Cavity - Dentition  Adequate natural dentition    Treatment Methods  Skilled observation;Therapeutic exercise;Patient/caregiver education    Patient observed directly with PO's  No    Type of cueing  Verbal;Tactile;Visual    Amount of cueing  --    Other treatment/comments  Min cues occasionally needed from SLP to properly complete mendelsohn, and occasional faded to rare min cues with gauze exercise. SLP ensured pt could tell SLP 3 overt s/s aspiration PNA with modified independence. SLP re-educated pt and mother on overt s/s aspiration PNA. SLP  reiterated why pt is completing exercises ("to make swallowing muscles stronger so they can guide food and liquid the right way into your stomach").        Assessment / Recommendations / Plan   Plan  Continue with current plan of care      Progression Toward Goals   Progression toward goals  Progressing toward goals       SLP Education - 11/18/17 1400    Education provided  Yes    Education Details  HEP (occasional min on mendelsohn and occasional min faded to rare min on gauze)    Person(s) Educated  Patient;Parent(s)    Methods  Explanation;Demonstration;Verbal cues;Handout    Comprehension  Verbal cues required;Returned demonstration;Need further instruction;Verbalized understanding       SLP Short Term Goals - 10/26/17 1530      SLP SHORT TERM GOAL #1   Title  pt will demo swallowing HEP with occasional min A over three sessions    Status  Not Met      SLP SHORT TERM GOAL #2   Title  pt will tell SLP 3 overt s/s aspiration PNA with rare min A over two sessions    Status  Deferred       SLP Long Term Goals - 11/18/17 1318  SLP LONG TERM GOAL #1   Title  pt will complete HEP at least 6/7 days/week, reported by mother and/or pt    Status  Achieved      SLP LONG TERM GOAL #2   Title  pt will complete swallowing HEP with occasional mod A    Status  Achieved      SLP LONG TERM GOAL #3   Title  pt will tell SLP 3 overt s/s aspiration PNA in 3 therapy sessions    Baseline  11-18-17    Time  5    Period  Weeks    Status  On-going      SLP LONG TERM GOAL #4   Title  pt will require rare min A for HEP in 4 sessions    Baseline  10-26-17, 10-28-17    Time  5    Period  Weeks    Status  On-going       Plan - 11/18/17 1403    Clinical Impression Statement  Pt cont to need some level of assistance from SLP each session, due to not always allowing mother to assist her at home.  Due to this, pt will cont to need x1/week skilled ST to assess appropriate procedure for  swallowing HEP, and eventually for pt to achieve safety for pleasure feeds.    Speech Therapy Frequency  1x /week    Duration  -- 6 weeks/23 total visits    Treatment/Interventions  Aspiration precaution training;Pharyngeal strengthening exercises;Internal/external aids;Multimodal communcation approach;Patient/family education;Compensatory strategies;SLP instruction and feedback    Potential Considerations  Severity of impairments;Previous level of function;Ability to learn/carryover information       Patient will benefit from skilled therapeutic intervention in order to improve the following deficits and impairments:   Dysphagia, oropharyngeal phase    Problem List Patient Active Problem List   Diagnosis Date Noted  . Asthma with status asthmaticus 07/13/2013  . Hypothyroidism 07/13/2013  . Cerebral palsy ([place]) 07/13/2013  . Schizo affective schizophrenia ([place]) 07/13/2013  . 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 Anxiety 07/13/2013  . Status asthmaticus 07/13/2013    [facility] ,[place], CCC-SLP  11/18/2017, 2:06 PM  [facility] [address] [place], [place], [address] Phone: [phone]   Fax:  [phone]   Name: Angela Nicholson MRN: [id number] Date of Birth: 1970/11/21

## 2017-11-18 NOTE — Patient Instructions (Signed)
Signs of Aspiration Pneumonia   . Chest pain/tightness . Fever (can be low grade) . Cough  o With foul-smelling phlegm (sputum) o With sputum containing pus or blood o With greenish sputum . Fatigue  . Shortness of breath  . Wheezing   **IF YOU HAVE THESE SIGNS, CONTACT YOUR DOCTOR OR GO TO THE EMERGENCY DEPARTMENT OR URGENT CARE AS SOON AS POSSIBLE**      

## 2017-11-23 ENCOUNTER — Ambulatory Visit: Payer: Medicare Other | Admitting: Speech Pathology

## 2017-11-23 DIAGNOSIS — R1312 Dysphagia, oropharyngeal phase: Secondary | ICD-10-CM

## 2017-11-23 NOTE — Therapy (Signed)
[facility] [address] [place], [place], [address] Phone: [phone]   Fax:  [phone]  Speech Language Pathology Treatment  Patient Details  Name: Angela Nicholson MRN: [id number] Date of Birth: 02/02/1971 Referring Provider: Kate Sable, MD   Encounter Date: 11/23/2017  End of Session - 11/23/17 1409    Visit Number  20    Number of Visits  23    Date for SLP Re-Evaluation  12/17/17    SLP Start Time  6433 2951 check in    SLP Stop Time   1400    SLP Time Calculation (min)  35 min    Activity Tolerance  Patient tolerated treatment well;Other (comment) exercise limited due to ? reflux       Past Medical History:  Diagnosis Date  . Asthma   . Barrett esophagus   . Deaf   . GERD (gastroesophageal reflux disease)   . Schizo affective schizophrenia ([place])     No past surgical history on file.  There were no vitals filed for this visit.  Subjective Assessment - 11/23/17 1325    Subjective  Pt demos Masako upon sitting down.     Patient is accompained by:  Family member;Interpreter Angela Nicholson    Currently in Pain?  No/denies            ADULT SLP TREATMENT - 11/23/17 1326      General Information   Behavior/Cognition  Alert;Cooperative;Pleasant mood    Patient Positioning  Upright in chair      Treatment Provided   Treatment provided  Dysphagia      Dysphagia Treatment   Temperature Spikes Noted  No    Respiratory Status  Room air    Oral Cavity - Dentition  Adequate natural dentition    Treatment Methods  Skilled observation;Therapeutic exercise;Patient/caregiver education    Patient observed directly with PO's  No    Type of cueing  Verbal;Tactile;Visual    Other treatment/comments  Per report pt completed exercises 1x per day. SLP educated pt re: need to complete 3x per day, using rationale that when we eat, we exercise our muscles 3x per day when we swallow our food. Emphasized importance  of using muscles regularly or they would remain weak. Pt made 2 attempts at [facility] but had immediate, strong coughing and choking, reported feeling bubble in her throat. Pt's mother reported giving her tube feed just prior to session, which she does not normally do. SLP questions reflux with prolonged UES opening. With Masako, pt completed 6 repetitions; more coughing noted and pt grew red-faced. SLP ceased exercises and focused session on education re: aspiration PNA. Pt able to tell SLP 3 signs of aspiration PNA with question cues and retrieved her handout. She did not understand word "fatigue" or "phlegm" and so SLP explained these terms. Pt verbalized understanding. Education provided re: multifactorial risks of aspiration due to oropharyngeal dysphagia, but also due to reflux. Reinforced importance of oral care and following reflux precautions with tube feeds. Mother reports pt does not typically practice exercises after tube feedings and SLP agreed it would be better to allow pt more time for digestion.      Pain Assessment   Pain Assessment  No/denies pain      Assessment / Recommendations / Plan   Plan  Continue with current plan of care      Progression Toward Goals   Progression toward goals  Progressing toward goals       SLP  Education - 11/23/17 1409    Education provided  Yes    Education Details  frequency of HEP, signs of aspiration PNA, oral care and reflux precautions    Person(s) Educated  Patient;Parent(s)    Methods  Explanation;Handout;Verbal cues    Comprehension  Verbalized understanding;Returned demonstration;Need further instruction;Verbal cues required       SLP Short Term Goals - 11/23/17 1413      SLP SHORT TERM GOAL #1   Title  pt will demo swallowing HEP with occasional min A over three sessions    Status  Not Met      SLP SHORT TERM GOAL #2   Title  pt will tell SLP 3 overt s/s aspiration PNA with rare min A over two sessions    Status  Deferred        SLP Long Term Goals - 11/23/17 1413      SLP LONG TERM GOAL #1   Title  pt will complete HEP at least 6/7 days/week, reported by mother and/or pt    Status  Achieved      SLP LONG TERM GOAL #2   Title  pt will complete swallowing HEP with occasional mod A    Status  Achieved      SLP LONG TERM GOAL #3   Title  pt will tell SLP 3 overt s/s aspiration PNA in 3 therapy sessions    Baseline  11-18-17, 11/23/17    Time  4    Period  Weeks    Status  On-going      SLP LONG TERM GOAL #4   Title  pt will require rare min A for HEP in 4 sessions    Baseline  10-26-17, 10-28-17    Time  4    Period  Weeks    Status  On-going       Plan - 11/23/17 1411    Clinical Impression Statement  Pt with probable reflux today with UES opening during exercises as tube feed given immediately prior to session. Focused session on education re: signs and risks of aspiration PNA, as well as rationale for 3x per day completion of HEP. Pt cont to need some level of assistance from SLP each session, due to not always allowing mother to assist her at home.  Due to this, pt will cont to need x1/week skilled ST to assess appropriate procedure for swallowing HEP, and eventually for pt to achieve safety for pleasure feeds.     Speech Therapy Frequency  1x /week    Treatment/Interventions  Aspiration precaution training;Pharyngeal strengthening exercises;Internal/external aids;Multimodal communcation approach;Patient/family education;Compensatory strategies;SLP instruction and feedback    Potential to Achieve Goals  Fair    Potential Considerations  Severity of impairments;Previous level of function;Ability to learn/carryover information    Consulted and Agree with Plan of Care  Patient;Family member/caregiver    Family Member Consulted  mom       Patient will benefit from skilled therapeutic intervention in order to improve the following deficits and impairments:   Dysphagia, oropharyngeal phase    Problem  List Patient Active Problem List   Diagnosis Date Noted  . Asthma with status asthmaticus 07/13/2013  . Hypothyroidism 07/13/2013  . Cerebral palsy ([place]) 07/13/2013  . Schizo affective schizophrenia ([place]) 07/13/2013  . 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 Anxiety 07/13/2013  . Status asthmaticus 07/13/2013   Angela Nicholson, [place], [place]  Speech-Language Pathologist  Angela Nicholson 11/23/2017, 2:14 PM  [facility] [address] [place], [place], [address] Phone: [phone]   Fax:  [phone]   Name: Angela Nicholson MRN: [id number] Date of Birth: 1971-04-23

## 2017-11-30 ENCOUNTER — Ambulatory Visit: Payer: Medicare Other

## 2017-11-30 DIAGNOSIS — R1312 Dysphagia, oropharyngeal phase: Secondary | ICD-10-CM

## 2017-11-30 NOTE — Therapy (Signed)
[facility] [address] [place], [place], [address] Phone: [phone]   Fax:  [phone]  Speech Language Pathology Treatment  Patient Details  Name: Angela Nicholson MRN: [id number] Date of Birth: Jan 29, 1971 Referring Provider: Kate Sable, MD   Encounter Date: 11/30/2017  End of Session - 11/30/17 1404    Visit Number  21    Number of Visits  23    Date for SLP Re-Evaluation  12/17/17    SLP Start Time  110    SLP Stop Time   1344    SLP Time Calculation (min)  25 min    Activity Tolerance  Patient tolerated treatment well       Past Medical History:  Diagnosis Date  . Asthma   . Barrett esophagus   . Deaf   . GERD (gastroesophageal reflux disease)   . Schizo affective schizophrenia ([place])     History reviewed. No pertinent surgical history.  There were no vitals filed for this visit.  Subjective Assessment - 11/30/17 1322    Subjective  Pt demo'd Mendelsohn upon sitting down, little thyroid hold noted.    Patient is accompained by:  Family member;Interpreter mother    Currently in Pain?  No/denies            ADULT SLP TREATMENT - 11/30/17 1352      General Information   Behavior/Cognition  Alert;Cooperative;Pleasant mood      Dysphagia Treatment   Temperature Spikes Noted  No    Respiratory Status  Room air    Oral Cavity - Dentition  Adequate natural dentition    Treatment Methods  Skilled observation;Therapeutic exercise;Patient/caregiver education    Patient observed directly with PO's  No    Other treatment/comments  Pt req'd usual min A with Mendelsohn procedure as pt was not holding thyroid for any noticable time. SLP encouraged pt to run 5 reps of effortful swallow and of Masako and pt did so, with independence. Pt then demo'd towel hold with independence. SLP told mother and pt that SLP hopes all this work by pt will result in her having something to eat by mouth, but we will  need to wait until next modified.      Assessment / Recommendations / Plan   Plan  Continue with current plan of care      Progression Toward Goals   Progression toward goals  Progressing toward goals       SLP Education - 11/30/17 1404    Education provided  Yes    Education Details  reps needed for HEP, [employer]) Educated  Patient;Parent(s)    Methods  Explanation;Demonstration;Verbal cues    Comprehension  Verbalized understanding;Need further instruction;Verbal cues required       SLP Short Term Goals - 11/23/17 1413      SLP SHORT TERM GOAL #1   Title  pt will demo swallowing HEP with occasional min A over three sessions    Status  Not Met      SLP SHORT TERM GOAL #2   Title  pt will tell SLP 3 overt s/s aspiration PNA with rare min A over two sessions    Status  Deferred       SLP Long Term Goals - 11/30/17 1407      SLP LONG TERM GOAL #1   Title  pt will complete HEP at least 6/7 days/week, reported by mother and/or pt    Status  Achieved  SLP LONG TERM GOAL #2   Title  pt will complete swallowing HEP with occasional mod A    Status  Achieved      SLP LONG TERM GOAL #3   Title  pt will tell SLP 3 overt s/s aspiration PNA in 3 therapy sessions    Baseline  11-18-17, 11/23/17    Time  4    Period  Weeks    Status  On-going      SLP LONG TERM GOAL #4   Title  pt will require rare min A for HEP in 4 sessions    Baseline  10-26-17, 10-28-17    Time  4    Period  Weeks    Status  On-going       Plan - 11/30/17 1405    Clinical Impression Statement  Pt cont to need some level of assistance from SLP each session, due to not always allowing mother to assist her at home.  See "skilled interventions" for details. Pt was better with MAsako and effortful swallows today; SLP wrote minimum number of total reps pt needs to complete each day next to each exercise. Recommend follow up modified barium swallow exam first week in June 2019 - SLP to request  order. Pt will cont to need x1/week skilled ST to assess appropriate procedure for swallowing HEP, and eventually for pt to achieve safety for pleasure feeds.    Speech Therapy Frequency  1x /week    Duration  -- 6 weeks/23 total visits    Treatment/Interventions  Aspiration precaution training;Pharyngeal strengthening exercises;Internal/external aids;Multimodal communcation approach;Patient/family education;Compensatory strategies;SLP instruction and feedback    Potential Considerations  Severity of impairments;Previous level of function;Ability to learn/carryover information       Patient will benefit from skilled therapeutic intervention in order to improve the following deficits and impairments:   Dysphagia, oropharyngeal phase    Problem List Patient Active Problem List   Diagnosis Date Noted  . Asthma with status asthmaticus 07/13/2013  . Hypothyroidism 07/13/2013  . Cerebral palsy ([place]) 07/13/2013  . Schizo affective schizophrenia ([place]) 07/13/2013  . 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 Anxiety 07/13/2013  . Status asthmaticus 07/13/2013    [facility] ,MS, CCC-SLP  11/30/2017, 2:08 PM  [facility] [address] [place], [place], [address] Phone: [phone]   Fax:  [phone]   Name: Angela Nicholson MRN: [id number] Date of Birth: [birth year]

## 2017-12-11 ENCOUNTER — Ambulatory Visit: Payer: Medicare Other

## 2017-12-11 DIAGNOSIS — R1312 Dysphagia, oropharyngeal phase: Secondary | ICD-10-CM | POA: Diagnosis not present

## 2017-12-11 NOTE — Therapy (Signed)
[facility] [address] [place], [place], [address] Phone: [phone]   Fax:  [phone]  Speech Language Pathology Treatment  Patient Details  Name: Angela Nicholson MRN: [id number] Date of Birth: 05-29-1971 Referring Provider: Kate Sable, MD   Encounter Date: 12/11/2017  End of Session - 12/11/17 1609    Visit Number  22    Number of Visits  23    Date for SLP Re-Evaluation  12/17/17    SLP Start Time  1525    SLP Stop Time   1600    SLP Time Calculation (min)  35 min    Activity Tolerance  Patient tolerated treatment well       Past Medical History:  Diagnosis Date  . Asthma   . Barrett esophagus   . Deaf   . GERD (gastroesophageal reflux disease)   . Schizo affective schizophrenia ([place])     No past surgical history on file.  There were no vitals filed for this visit.         ADULT SLP TREATMENT - 12/11/17 1603      General Information   Behavior/Cognition  Alert;Cooperative;Pleasant mood      Treatment Provided   Treatment provided  Dysphagia      Dysphagia Treatment   Temperature Spikes Noted  No    Respiratory Status  Room air    Oral Cavity - Dentition  Adequate natural dentition    Treatment Methods  Skilled observation;Therapeutic exercise    Patient observed directly with PO's  No    Type of cueing  Verbal;Visual    Other treatment/comments  Pt req'd SBA with Mendelsohn today - pt achieved success with 5 reps with 3 second thyroid hold, and was independent with 5 reps of all other exercises (towel hold -pt only did one rep). SLP did not view pt doing Shaker but pt described what she was doing and told SLP she was holding neck up for 45 seconds. She used drops H2O with Masako and Mendelsohn as well as effortful swallow/Showa. Mother reports pt is performing exercises every day with at least the prescribed rep numbers (except for [facility]). SLP told pt/mother that modifeid  (MBSS) will be suggested after her next visit, to track progress and hopefully allow pt some POs.       Assessment / Recommendations / Plan   Plan  Continue with current plan of care      Progression Toward Goals   Progression toward goals  Progressing toward goals         SLP Short Term Goals - 11/23/17 1413      SLP SHORT TERM GOAL #1   Title  pt will demo swallowing HEP with occasional min A over three sessions    Status  Not Met      SLP SHORT TERM GOAL #2   Title  pt will tell SLP 3 overt s/s aspiration PNA with rare min A over two sessions    Status  Deferred       SLP Long Term Goals - 12/11/17 1611      SLP LONG TERM GOAL #1   Title  pt will complete HEP at least 6/7 days/week, reported by mother and/or pt    Status  Achieved      SLP LONG TERM GOAL #2   Title  pt will complete swallowing HEP with occasional mod A    Status  Achieved      SLP LONG TERM GOAL #  3   Title  pt will tell SLP 3 overt s/s aspiration PNA in 3 therapy sessions    Baseline  11-18-17, 11/23/17    Time  2    Period  Weeks    Status  On-going      SLP LONG TERM GOAL #4   Title  pt will require rare min A for HEP in 4 sessions    Baseline  10-26-17, 10-28-17, 12-11-17    Time  2    Period  Weeks    Status  On-going       Plan - 12/11/17 1609    Clinical Impression Statement  Pt req'd SBA with Mendelsohn and was independent with other viewed exercises on her HEP (Shaker not viewed today by SLP). See "skilled interventions" for details. Pt reports completing at least the recommended total number of reps/day, except for [facility] - SLP told pt to do what she could do with that exercise. Likely that a modified (MBSS)  will be recommended after her next visit. Pt will cont to need x1/week skilled ST to assess appropriate procedure for swallowing HEP, and eventually for pt to achieve safety for pleasure feeds.    Speech Therapy Frequency  1x /week    Duration  -- 6 weeks/23 total visits     Treatment/Interventions  Aspiration precaution training;Pharyngeal strengthening exercises;Internal/external aids;Multimodal communcation approach;Patient/family education;Compensatory strategies;SLP instruction and feedback       Patient will benefit from skilled therapeutic intervention in order to improve the following deficits and impairments:   Dysphagia, oropharyngeal phase    Problem List Patient Active Problem List   Diagnosis Date Noted  . Asthma with status asthmaticus 07/13/2013  . Hypothyroidism 07/13/2013  . Cerebral palsy ([place]) 07/13/2013  . Schizo affective schizophrenia ([place]) 07/13/2013  . 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 Anxiety 07/13/2013  . Status asthmaticus 07/13/2013    [facility] ,MS, CCC-SLP  12/11/2017, 4:13 PM  [facility] [address] [place], [place], [address] Phone: [phone]   Fax:  [phone]   Name: Angela Nicholson MRN: [id number] Date of Birth: 02-23-1971

## 2017-12-17 ENCOUNTER — Ambulatory Visit: Payer: Medicare Other

## 2017-12-17 DIAGNOSIS — R1312 Dysphagia, oropharyngeal phase: Secondary | ICD-10-CM

## 2017-12-17 NOTE — Therapy (Signed)
[facility] [address] [place], [place], [address] Phone: [phone]   Fax:  [phone]  Speech Language Pathology Treatment  Patient Details  Name: Ledia Hanford MRN: [id number] Date of Birth: 27-Mar-1971 Referring Provider: Kate Sable, MD   Encounter Date: 12/17/2017  End of Session - 12/17/17 1711    Visit Number  23    Number of Visits  23    Date for SLP Re-Evaluation  12/17/17    SLP Start Time  1532    SLP Stop Time   1558    SLP Time Calculation (min)  26 min       Past Medical History:  Diagnosis Date  . Asthma   . Barrett esophagus   . Deaf   . GERD (gastroesophageal reflux disease)   . Schizo affective schizophrenia ([place])     History reviewed. No pertinent surgical history.  There were no vitals filed for this visit.  Subjective Assessment - 12/17/17 1700    Subjective  "It's graduation day!" (via interpreter)    Patient is accompained by:  Family member;Interpreter            ADULT SLP TREATMENT - 12/17/17 1701      General Information   Behavior/Cognition  Alert;Cooperative;Pleasant mood      Treatment Provided   Treatment provided  Dysphagia      Dysphagia Treatment   Temperature Spikes Noted  No    Respiratory Status  Room air    Oral Cavity - Dentition  Adequate natural dentition    Treatment Methods  Skilled observation;Therapeutic exercise    Patient observed directly with PO's  Yes    Type of PO's observed  -- 1/8 teaspoons water with HEP    Oral Phase Signs & Symptoms  -- none noted    Pharyngeal Phase Signs & Symptoms  -- none noted    Type of cueing  Verbal;Visual    Other treatment/comments  Pt req'd SBA with Mendelsohn to hold thyroid for >2 seconds, and with Masako to keep tongue protruded. SLP discussed modified (MBSS) with pt/mother; pt somewhat familiar with test as this is her follow up. Pt req'd small clarifications about total number of reps/day for  certain exercises and SLP provided guidance. SLP told pt to cont to complete HEP every day until test, then x2/week after the test.      Assessment / Recommendations / [place] with current plan of care one more visit after MBSS to discuss results/recommendations      Progression Toward Goals   Progression toward goals  Progressing toward goals       SLP Education - 12/17/17 1711    Education provided  Yes    Education Details  HEP is BID until the test, then twice a week after that    Person(s) Educated  Patient;Parent(s)    Methods  Explanation    Comprehension  Verbalized understanding       SLP Short Term Goals - 11/23/17 1413      SLP SHORT TERM GOAL #1   Title  pt will demo swallowing HEP with occasional min A over three sessions    Status  Not Met      SLP SHORT TERM GOAL #2   Title  pt will tell SLP 3 overt s/s aspiration PNA with rare min A over two sessions    Status  Deferred       SLP Long Term Goals -  12/17/17 1714      SLP LONG TERM GOAL #1   Title  pt will complete HEP at least 6/7 days/week, reported by mother and/or pt    Status  Achieved      SLP LONG TERM GOAL #2   Title  pt will complete swallowing HEP with occasional mod A    Status  Achieved      SLP LONG TERM GOAL #3   Title  pt will tell SLP 3 overt s/s aspiration PNA in 3 therapy sessions    Time  1    Period  Weeks    Status  On-going      SLP LONG TERM GOAL #4   Title  pt will require rare min A for HEP in 4 sessions    Status  Achieved       Plan - 12/17/17 1712    Clinical Impression Statement  Pt req'd SBA with Mendelsohn and MAsako and was independent with other viewed exercises on her HEP (Shaker not viewed today by SLP). See "skilled treatment" for details. SLP told pt to do what she could do with that exercise. A modified (MBSS) is recommended. Pt will cont to need at least one more visit of skilled ST to assess appropriate procedure for swallowing HEP, and to  educate pt and mother following upcoming MBSS.    Speech Therapy Frequency  1x /week    Duration  -- 6 weeks/23 total visits    Treatment/Interventions  Aspiration precaution training;Pharyngeal strengthening exercises;Internal/external aids;Multimodal communcation approach;Patient/family education;Compensatory strategies;SLP instruction and feedback       Patient will benefit from skilled therapeutic intervention in order to improve the following deficits and impairments:   Dysphagia, oropharyngeal phase    Problem List Patient Active Problem List   Diagnosis Date Noted  . Asthma with status asthmaticus 07/13/2013  . Hypothyroidism 07/13/2013  . Cerebral palsy ([place]) 07/13/2013  . Schizo affective schizophrenia ([place]) 07/13/2013  . 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 Anxiety 07/13/2013  . Status asthmaticus 07/13/2013    [facility] ,[place], CCC-SLP  12/17/2017, 5:16 PM  [facility] [address] [place], [place], [address] Phone: [phone]   Fax:  [phone]   Name: Jnaya Butrick MRN: [id number] Date of Birth: 05-Nov-1970

## 2018-01-01 ENCOUNTER — Ambulatory Visit: Payer: Medicare Other | Attending: Family Medicine

## 2018-01-01 DIAGNOSIS — R1312 Dysphagia, oropharyngeal phase: Secondary | ICD-10-CM | POA: Insufficient documentation

## 2018-01-01 NOTE — Patient Instructions (Signed)
Signs of Aspiration Pneumonia   . Chest pain/tightness . Fever (can be low grade) . Cough  o With foul-smelling phlegm (sputum) o With sputum containing pus or blood o With greenish sputum . Fatigue  . Shortness of breath  . Wheezing   **IF YOU HAVE THESE SIGNS, CONTACT YOUR DOCTOR OR GO TO THE EMERGENCY DEPARTMENT OR URGENT CARE AS SOON AS POSSIBLE**      

## 2018-01-01 NOTE — Therapy (Signed)
[facility] [address] [place], [place], [address] Phone: [phone]   Fax:  [phone]  Speech Language Pathology Treatment  Patient Details  Name: Angela Nicholson MRN: [id number] Date of Birth: 07/06/1971 Referring Provider: Kate Sable, MD   Encounter Date: 01/01/2018  End of Session - 01/01/18 1545    Visit Number  24    Number of Visits  36    Date for SLP Re-Evaluation  03/19/18    SLP Start Time  1324    SLP Stop Time   58    SLP Time Calculation (min)  41 min    Activity Tolerance  Patient tolerated treatment well       Past Medical History:  Diagnosis Date  . Asthma   . Barrett esophagus   . Deaf   . GERD (gastroesophageal reflux disease)   . Schizo affective schizophrenia ([place])     No past surgical history on file.  There were no vitals filed for this visit.  Subjective Assessment - 01/01/18 1432    Subjective  "She said I could eat!"     Patient is accompained by:  Interpreter;Family member mother and sister, and interpreter    Currently in Pain?  No/denies            ADULT SLP TREATMENT - 01/01/18 1432      General Information   Behavior/Cognition  Alert;Cooperative;Pleasant mood      Dysphagia Treatment   Temperature Spikes Noted  No    Respiratory Status  Room air    Oral Cavity - Dentition  Adequate natural dentition    Treatment Methods  Skilled observation;Therapeutic exercise;Compensation strategy training;Patient/caregiver education    Patient observed directly with PO's  Yes    Type of PO's observed  Dysphagia 1 (puree)    Pharyngeal Phase Signs & Symptoms  Complaints of residue;Multiple swallows pt directed to do multiple swallows    Type of cueing  Verbal    Amount of cueing  Modified independent (with precautions with puree)    Other treatment/comments  SLP reviewed modified (MBSS) results with pt/mother/sister. Discussion whether or  not pt may benefit from  NMES for swallowing. Pt/mother to decide but in case she does desire, she was scheduled for x3/week x 2 weeks. SLP to send renewal today. Pt ate x4 boluses of applesauce without overt s/s aspiration, took approx 3 additional swallows to clear (4 total). Suspect pt will fatigue over a meal, so SLP told pt to tell mother when she is feeling fatigued. Pt with hydrophonic voice and instructed/educated all parties re: "wet" voice and what it meant, and what to do. Trained pt to cough/throat clear and reswallow. Pt sister asked if pt could bring food to trial with SLP here to see if it is safe. SLP ansewred affirmatively, for training pt/family members re: signs of difficulty and of aspiration PNA. Pt mother reported knowledge of these with modified independence.      Assessment / Recommendations / Plan   Plan  Goals updated      Progression Toward Goals   Progression toward goals  Progressing toward goals       SLP Education - 01/01/18 1544    Education provided  Yes    Education Details  swallow precautions from MBSS, NMES basics/controversial benefit, dietician vs SLP differences    Person(s) Educated  Patient;Parent(s);Caregiver(s)    Methods  Explanation;Demonstration;Verbal cues;Tactile cues    Comprehension  Verbalized understanding;Tactile cues required;Verbal cues required;Returned demonstration;Need  further instruction       SLP Short Term Goals - 11/23/17 1413      SLP SHORT TERM GOAL #1   Title  pt will demo swallowing HEP with occasional min A over three sessions    Status  Not Met      SLP SHORT TERM GOAL #2   Title  pt will tell SLP 3 overt s/s aspiration PNA with rare min A over two sessions    Status  Deferred       SLP Long Term Goals - 01/01/18 1551      SLP LONG TERM GOAL #1   Title  pt will complete HEP at least 6/7 days/week, reported by mother and/or pt    Status  Achieved      SLP LONG TERM GOAL #2   Title  pt will complete swallowing HEP with occasional mod A  over three sessions    Time  4    Period  Weeks    Status  Revised or 12 more visits, for all enforced LTGs)      SLP LONG TERM GOAL #3   Title  pt will tell SLP 3 overt s/s aspiration PNA in 3 therapy sessions    Baseline  01-01-18    Time  4    Period  Weeks    Status  On-going      SLP LONG TERM GOAL #4   Title  pt will require occasional min A for HEP in 3 sessions    Time  4    Status  Achieved       Plan - 01/01/18 1547    Clinical Impression Statement  Pt with modified on 12-16-17 recommending dys III/thin with swallow precautions effortful swallow, small bites/sips, multiple swallows for each bite/sip, supraglottic swallow, stay upright 20-30-minutes after POs. She req'd SBA with her swallow precautions. See "skilled treatment" for details. Pt will cont to need more vistis of skilled ST to assess appropriate procedure for swallowing HEP, and to educate re: return to PO diet (muscle strength needed for ingesting a meal, food choices, etc).    Speech Therapy Frequency  3x / week    Duration  -- 4 weeks/36 total visits    Treatment/Interventions  Aspiration precaution training;Pharyngeal strengthening exercises;Internal/external aids;Multimodal communcation approach;Patient/family education;Compensatory strategies;SLP instruction and feedback       Patient will benefit from skilled therapeutic intervention in order to improve the following deficits and impairments:   Dysphagia, oropharyngeal phase    Problem List Patient Active Problem List   Diagnosis Date Noted  . Asthma with status asthmaticus 07/13/2013  . Hypothyroidism 07/13/2013  . Cerebral palsy ([place]) 07/13/2013  . Schizo affective schizophrenia ([place]) 07/13/2013  . 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 Anxiety 07/13/2013  . Status asthmaticus 07/13/2013    [facility] ,MS, CCC-SLP  01/01/2018, 3:54 PM  [facility] [address] [place], [place], [address] Phone: [phone]   Fax:  [phone]   Name: Angela Nicholson MRN: [id number] Date of Birth: January 15, 1971

## 2018-02-04 ENCOUNTER — Ambulatory Visit: Payer: Medicare Other | Attending: Family Medicine

## 2018-02-04 DIAGNOSIS — R1312 Dysphagia, oropharyngeal phase: Secondary | ICD-10-CM | POA: Insufficient documentation

## 2018-02-04 NOTE — Therapy (Signed)
[facility] [address] [place], [place], [address] Phone: [phone]   Fax:  [phone]  Speech Language Pathology Treatment  Patient Details  Name: Angela Nicholson MRN: [id number] Date of Birth: 07-20-1971 Referring Provider: Kate Sable, MD   Encounter Date: 02/04/2018  End of Session - 02/04/18 1707    Visit Number  25    Number of Visits  36    Date for SLP Re-Evaluation  03/19/18    SLP Start Time  1321    SLP Stop Time   1401    SLP Time Calculation (min)  40 min    Activity Tolerance  Patient tolerated treatment well       Past Medical History:  Diagnosis Date  . Asthma   . Barrett esophagus   . Deaf   . GERD (gastroesophageal reflux disease)   . Schizo affective schizophrenia ([place])     History reviewed. No pertinent surgical history.  There were no vitals filed for this visit.  Subjective Assessment - 02/04/18 1330    Subjective  Pt would like to do NMES for swalloiwng.    Patient is accompained by:  Family member;Interpreter mother    Currently in Pain?  No/denies            ADULT SLP TREATMENT - 02/04/18 1427      General Information   Behavior/Cognition  Alert;Cooperative;Pleasant mood      Treatment Provided   Treatment provided  Dysphagia      Dysphagia Treatment   Temperature Spikes Noted  No    Respiratory Status  Room air    Oral Cavity - Dentition  Adequate natural dentition    Treatment Methods  Skilled observation;Compensation strategy training;Patient/caregiver education    Patient observed directly with PO's  Yes    Type of PO's observed  Dysphagia 1 (puree);Thin liquids    Oral Phase Signs & Symptoms  -- none noted    Pharyngeal Phase Signs & Symptoms  Wet vocal quality rarely - cleared with subsequent spontaneous swallows    Type of cueing  Verbal    Amount of cueing  -- occasional    Other treatment/comments  SLP watched pt with yogurt today - she  followed all swallow precautions however mother stated pt eats more rapidly at home and has more difficulty when this occurs. SLP continually reiterated to pt to slow down her pace of eating. SLP placed NMES on pt's neck in vertical position (like for 3a) at a pt-reported therapeutic frequency, and pt asked to have electrodes removed. SLP told pt that she would assimilate to the feeling after 8-10 minutes and even moreso as sessions progressed. Pt appeared as if she may not desire NMES after the experience today. Will ask pt tomorrow for sure what she desires. SLP educated pt and mother on dys IIII items - mother has not suggested bread to pt so pt to bring peanut butter and jelly sandwich tomorrow to attempt with SLP.       Assessment / Recommendations / Plan   Plan  Continue with current plan of care      Progression Toward Goals   Progression toward goals  Progressing toward goals       SLP Education - 02/04/18 1706    Education provided  Yes    Education Details  swallow precuations, basics of NMES    Person(s) Educated  Patient;Parent(s)    Methods  Explanation;Verbal cues    Comprehension  Verbalized understanding  SLP Short Term Goals - 11/23/17 1413      SLP SHORT TERM GOAL #1   Title  pt will demo swallowing HEP with occasional min A over three sessions    Status  Not Met      SLP SHORT TERM GOAL #2   Title  pt will tell SLP 3 overt s/s aspiration PNA with rare min A over two sessions    Status  Deferred       SLP Long Term Goals - 02/04/18 1709      SLP LONG TERM GOAL #1   Title  pt will complete HEP at least 6/7 days/week, reported by mother and/or pt    Status  Achieved      SLP LONG TERM GOAL #2   Title  pt will complete swallowing HEP with occasional mod A over three sessions    Time  4    Period  Weeks    Status  On-going or 12 more visits, for all enforced LTGs)      SLP LONG TERM GOAL #3   Title  pt will tell SLP 3 overt s/s aspiration PNA in 3  therapy sessions    Baseline  01-01-18    Time  4    Period  Weeks    Status  On-going      SLP LONG TERM GOAL #4   Title  pt will require occasional min A for HEP in 3 sessions    Time  4    Status  Achieved      SLP LONG TERM GOAL #5   Title  pt will follow swallow precautions with POs in 4 sessions     Baseline  02-04-18    Time  4    Period  Weeks    Status  New      Additional Long Term Goals   Additional Long Term Goals  Yes      SLP LONG TERM GOAL #6   Title  pt will generate effortful swallow with POs with NMES placed, 80% of the time over four sessions    Time  4    Period  Weeks    Status  New       Plan - 02/04/18 1707    Clinical Impression Statement  Today pt did very well following swallowing precautions with yogurt. Pt to bring sandwich tomorrow. Pt followed swallow precuations today with POs: effortful swallow, small bites/sips, multiple swallows for each bite/sip, supraglottic swallow, stay upright 20-30-minutes after POs. Pt also added throat clear after multiple swallows. Clear voice heard during throat clear 100% (7/7). Pt will cont to need more vistis of skilled ST to assess appropriate procedure for swallowing HEP, and to educate re: return to PO diet (muscle strength needed for ingesting a meal, food choices, etc).    Speech Therapy Frequency  3x / week    Duration  -- 4 weeks/36 total visits    Treatment/Interventions  Aspiration precaution training;Pharyngeal strengthening exercises;Internal/external aids;Multimodal communcation approach;Patient/family education;Compensatory strategies;SLP instruction and feedback       Patient will benefit from skilled therapeutic intervention in order to improve the following deficits and impairments:   Dysphagia, oropharyngeal phase    Problem List Patient Active Problem List   Diagnosis Date Noted  . Asthma with status asthmaticus 07/13/2013  . Hypothyroidism 07/13/2013  . Cerebral palsy ([place]) 07/13/2013  .  Schizo affective schizophrenia ([place]) 07/13/2013  . 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 Anxiety 07/13/2013  . Status asthmaticus 07/13/2013    [facility] ,[place], CCC-SLP  02/04/2018, 5:12 PM  [facility] [address] [place], [place], [address] Phone: [phone]   Fax:  [phone]   Name: Angela Nicholson MRN: [id number] Date of Birth: October 07, 1970

## 2018-02-05 ENCOUNTER — Ambulatory Visit: Payer: Medicare Other

## 2018-02-05 DIAGNOSIS — R1312 Dysphagia, oropharyngeal phase: Secondary | ICD-10-CM | POA: Diagnosis not present

## 2018-02-05 NOTE — Therapy (Signed)
[facility] [address] [place], [place], [address] Phone: [phone]   Fax:  [phone]  Speech Language Pathology Treatment  Patient Details  Name: Angela Nicholson MRN: [id number] Date of Birth: [birth year] Referring Provider: Kate Sable, MD   Encounter Date: 02/05/2018  End of Session - 02/05/18 1548    Visit Number  26    Number of Visits  36    Date for SLP Re-Evaluation  03/19/18    SLP Start Time  73    SLP Stop Time   1401    SLP Time Calculation (min)  41 min    Activity Tolerance  Patient tolerated treatment well       Past Medical History:  Diagnosis Date  . Asthma   . Barrett esophagus   . Deaf   . GERD (gastroesophageal reflux disease)   . Schizo affective schizophrenia ([place])     No past surgical history on file.  There were no vitals filed for this visit.  Subjective Assessment - 02/04/18 1330    Subjective  Pt would like to do NMES for swalloiwng.    Patient is accompained by:  Family member;Interpreter mother    Currently in Pain?  No/denies            ADULT SLP TREATMENT - 02/05/18 1446      General Information   Behavior/Cognition  Alert;Cooperative;Pleasant mood      Treatment Provided   Treatment provided  Dysphagia      Dysphagia Treatment   Temperature Spikes Noted  No    Respiratory Status  Room air    Oral Cavity - Dentition  Adequate natural dentition    Treatment Methods  Skilled observation;Compensation strategy training;Patient/caregiver education    Patient observed directly with PO's  Yes    Type of PO's observed  Dysphagia 3 (soft);Thin liquids    Feeding  Able to feed self    Pharyngeal Phase Signs & Symptoms  Wet vocal quality;Delayed cough cough x1    Other treatment/comments  Mother brought diet list from dietician and it looked very appropriate for pt. Pt brought peanut butter/jelly sandwich for SLP to observe and offer assistance for  swallowing precautions. Pt req'd cues to chew adequately (chewed x3 for ~3/4" square of sandwich. SLP cued pt to "chew ten times" and pt consistently chewed >/+7 times for each bite, and reduced her bite sizes as well. "wet" voice heard approx 50% of the bites and pt did adequate job of achieving clear voice approx 50% of the time. SLP spent time educating pt about how a wet voice would feel to her (as she is deaf), and educated mother to indicate to pt when she hears "wet" voice ("gurgly voice") for pt so that she can raise her awareness of what it feels like in order to cough and reswallow. Mother was comfortable by session end with this education, as pt did not fully indicate her awareness of what "wet" voice felt like in her throat. SLP also used written cues to help with explanation.      Assessment / Recommendations / Plan   Plan  Continue with current plan of care      Progression Toward Goals   Progression toward goals  Progressing toward goals       SLP Education - 02/05/18 1547    Education provided  Yes    Education Details  "wet" voice sounds, feels    Person(s) Educated  Patient;Parent(s)  Methods  Explanation;Verbal cues    Comprehension  Verbalized understanding;Returned demonstration;Verbal cues required;Need further instruction       SLP Short Term Goals - 11/23/17 1413      SLP SHORT TERM GOAL #1   Title  pt will demo swallowing HEP with occasional min A over three sessions    Status  Not Met      SLP SHORT TERM GOAL #2   Title  pt will tell SLP 3 overt s/s aspiration PNA with rare min A over two sessions    Status  Deferred       SLP Long Term Goals - 02/05/18 1551      SLP LONG TERM GOAL #1   Title  pt will complete HEP at least 6/7 days/week, reported by mother and/or pt    Status  Achieved      SLP LONG TERM GOAL #2   Title  pt will complete swallowing HEP with occasional mod A over three sessions    Time  4    Period  Weeks    Status  On-going or 12  more visits, for all enforced LTGs)      SLP LONG TERM GOAL #3   Title  pt will tell SLP 3 overt s/s aspiration PNA in 3 therapy sessions    Baseline  01-01-18    Time  4    Period  Weeks    Status  On-going      SLP LONG TERM GOAL #4   Title  pt will require occasional min A for HEP in 3 sessions    Time  4    Status  Achieved      SLP LONG TERM GOAL #5   Title  pt will follow swallow precautions with POs in 4 sessions     Baseline  02-04-18    Time  4    Period  Weeks    Status  On-going      SLP LONG TERM GOAL #6   Title  pt will generate effortful swallow with POs with NMES placed, 80% of the time over four sessions    Time  4    Period  Weeks    Status  On-going       Plan - 02/05/18 1548    Clinical Impression Statement  Today pt had sandwich and followed swallow precuations today with POs: effortful swallow, small bites/sips, multiple swallows for each bite/sip, supraglottic swallow. SLP spent majority of today's session educating patient and mother on what "wet" swallow sounds like and feels like. Pt will cont to need more vistis of skilled ST to assess appropriate procedure for swallowing HEP, and to educate re: return to PO diet (precautions, food choices, etc).    Speech Therapy Frequency  3x / week    Duration  -- 4 weeks/36 total visits    Treatment/Interventions  Aspiration precaution training;Pharyngeal strengthening exercises;Internal/external aids;Multimodal communcation approach;Patient/family education;Compensatory strategies;SLP instruction and feedback       Patient will benefit from skilled therapeutic intervention in order to improve the following deficits and impairments:   Dysphagia, oropharyngeal phase    Problem List Patient Active Problem List   Diagnosis Date Noted  . Asthma with status asthmaticus 07/13/2013  . Hypothyroidism 07/13/2013  . Cerebral palsy ([place]) 07/13/2013  . Schizo affective schizophrenia ([place]) 07/13/2013  . Deafness or  hearing loss of type classifiable to 389.0 with type classifiable to 389.1 07/13/2013  . 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 Anxiety 07/13/2013  .  Status asthmaticus 07/13/2013    [facility] ,MS, CCC-SLP  02/05/2018, 3:52 PM  [facility] [address] [place], [place], [address] Phone: [phone]   Fax:  [phone]   Name: Angela Nicholson MRN: [id number] Date of Birth: Oct 18, 1970

## 2018-02-05 NOTE — Patient Instructions (Signed)
   If you feel a gurgly or popping or crackling in your throat, you need to cough and swallow hard.  Mom will let you know if she HEARS that sound, so that you can feel what it feels like. We want you to be able to FEEL that so you can cough and swallow hard whenever you need to.

## 2018-02-08 ENCOUNTER — Ambulatory Visit: Payer: Medicare Other

## 2018-02-08 DIAGNOSIS — R1312 Dysphagia, oropharyngeal phase: Secondary | ICD-10-CM | POA: Diagnosis not present

## 2018-02-08 NOTE — Therapy (Signed)
[facility] [address] [place], [place], [address] Phone: [phone]   Fax:  [phone]  Speech Language Pathology Treatment  Patient Details  Name: Angela Nicholson MRN: [id number] Date of Birth: 1970/11/03 Referring Provider: Kate Sable, MD   Encounter Date: 02/08/2018  End of Session - 02/08/18 1638    Visit Number  27    Number of Visits  36    Date for SLP Re-Evaluation  03/19/18    SLP Start Time  1447    SLP Stop Time   0037    SLP Time Calculation (min)  43 min    Activity Tolerance  Patient tolerated treatment well       Past Medical History:  Diagnosis Date  . Asthma   . Barrett esophagus   . Deaf   . GERD (gastroesophageal reflux disease)   . Schizo affective schizophrenia ([place])     History reviewed. No pertinent surgical history.  There were no vitals filed for this visit.  Subjective Assessment - 02/08/18 1504    Subjective  "No, not two (electrodes). I don't want two."    Patient is accompained by:  Interpreter;Family member            ADULT SLP TREATMENT - 02/08/18 1629      General Information   Behavior/Cognition  Alert;Cooperative pt initially upset re: placement of NMES      Treatment Provided   Treatment provided  Dysphagia      Dysphagia Treatment   Temperature Spikes Noted  No    Respiratory Status  Room air    Oral Cavity - Dentition  Adequate natural dentition    Treatment Methods  Skilled observation;Compensation strategy training;Patient/caregiver education    Patient observed directly with PO's  Yes    Type of PO's observed  Dysphagia 3 (soft);Thin liquids    Feeding  Able to feed self    Liquids provided via  Cup    Oral Phase Signs & Symptoms  -- short mastication process - cues to chew 7-10 times    Pharyngeal Phase Signs & Symptoms  Wet vocal quality;Immediate cough;Delayed cough x4 cough NMES placed (10 minutes); x2 cough without (19 min)    Type  of cueing  Verbal    Amount of cueing  Minimal usual    Other treatment/comments  SLP focused today's session on acclimating pt to NMES, food trial with ham/cheese sandwich in 3/4" squares, and education re: "wet" voice to encourage pt independence with knowledge of necessity to cough/reswallow. Pt followed precautions with rare min A. Pt req'd SLP usual mod faded to occasional min A re: hydrophonic voice. Pt intitially very against placement of NMES - refused both electrodes, and SLP had to convince pt to have one placed today. Strangely, however pt with more overt s/s with NMES placed than without, at 3.68m with vertical placement on rt.       Dysphagia Recommendations   Diet recommendations  Dysphagia 3 (mechanical soft);Thin liquid cut food into little pieces    Liquids provided via  Cup    Medication Administration  Crushed with puree    Supervision  Intermittent supervision to cue for compensatory strategies    Compensations  Slow rate;Small sips/bites;Multiple dry swallows after each bite/sip;Follow solids with liquid;Clear throat intermittently;Effortful swallow    Postural Changes and/or Swallow Maneuvers  Hold breath before and during swallow (Supraglottic swallow)      Progression Toward Goals   Progression toward goals  Progressing toward  goals       SLP Education - 02/08/18 1638    Education provided  Yes    Education Details  what wet voice feels like, swallow precautions    Person(s) Educated  Patient;Parent(s)    Methods  Explanation;Demonstration;Verbal cues    Comprehension  Verbalized understanding;Returned demonstration;Verbal cues required;Need further instruction       SLP Short Term Goals - 02/08/18 1642      SLP SHORT TERM GOAL #1   Title  pt will demo swallowing HEP with occasional min A over three sessions    Status  Not Met      SLP SHORT TERM GOAL #2   Title  pt will tell SLP 3 overt s/s aspiration PNA with rare min A over two sessions    Status  Deferred        SLP Long Term Goals - 02/08/18 1642      SLP LONG TERM GOAL #1   Title  pt will complete HEP at least 6/7 days/week, reported by mother and/or pt    Status  Achieved      SLP LONG TERM GOAL #2   Title  pt will complete swallowing HEP with occasional mod A over three sessions    Time  3    Period  Weeks    Status  On-going or 12 more visits, for all enforced LTGs)      SLP LONG TERM GOAL #3   Title  pt will tell SLP 3 overt s/s aspiration PNA in 3 therapy sessions    Baseline  01-01-18    Time  3    Period  Weeks    Status  On-going      SLP LONG TERM GOAL #4   Title  pt will require occasional min A for HEP in 3 sessions    Time  3    Status  Achieved      SLP LONG TERM GOAL #5   Title  pt will follow swallow precautions with POs in 4 sessions     Baseline  02-04-18    Time  3    Period  Weeks    Status  On-going      SLP LONG TERM GOAL #6   Title  pt will generate effortful swallow with POs with NMES placed, 80% of the time over four sessions    Status  Deferred      SLP LONG TERM GOAL #7   Title  pt will appropriately respond to hydrophonic voice during POs 80% of the time    Time  3    Period  Weeks    Status  New       Plan - 02/08/18 1639    Clinical Impression Statement  Today pt had sandwich and followed swallow precuations today with POs with rare min A: effortful swallow, small bites/sips, multiple swallows for each bite/sip, supraglottic swallow. Pt education re: "wet" voice is improving. Pt coughed more frequently with NMES placed and given pt's aversion thus far to NMES, SLP to cont to use traditional methods. Pt will cont to need more vistis of skilled ST to assess appropriate procedure for swallowing HEP, and to educate re: hydrophonic voice, and return to PO diet (precautions, food choices, etc).    Speech Therapy Frequency  3x / week    Duration  -- 4 weeks/36 total visits    Treatment/Interventions  Aspiration precaution training;Pharyngeal  strengthening exercises;Internal/external aids;Multimodal communcation approach;Patient/family education;Compensatory strategies;SLP instruction and  feedback       Patient will benefit from skilled therapeutic intervention in order to improve the following deficits and impairments:   Dysphagia, oropharyngeal phase    Problem List Patient Active Problem List   Diagnosis Date Noted  . Asthma with status asthmaticus 07/13/2013  . Hypothyroidism 07/13/2013  . Cerebral palsy ([place]) 07/13/2013  . Schizo affective schizophrenia ([place]) 07/13/2013  . 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 Anxiety 07/13/2013  . Status asthmaticus 07/13/2013    [facility] ,MS, CCC-SLP  02/08/2018, 4:44 PM  [facility] [address] [place], [place], [address] Phone: [phone]   Fax:  [phone]   Name: Serene Kopf MRN: [id number] Date of Birth: 21-Dec-1970

## 2018-02-10 ENCOUNTER — Ambulatory Visit: Payer: Medicare Other

## 2018-02-10 DIAGNOSIS — R1312 Dysphagia, oropharyngeal phase: Secondary | ICD-10-CM

## 2018-02-10 NOTE — Therapy (Signed)
[facility] [address] [place], [place], [address] Phone: [phone]   Fax:  [phone]  Speech Language Pathology Treatment  Patient Details  Name: Angela Nicholson MRN: [id number] Date of Birth: February 14, 1971 Referring Provider: Kate Sable, MD   Encounter Date: 02/10/2018  End of Session - 02/10/18 1632    Visit Number  28    Number of Visits  36    Date for SLP Re-Evaluation  03/19/18    SLP Start Time  1535    SLP Stop Time   1616    SLP Time Calculation (min)  41 min    Activity Tolerance  Patient tolerated treatment well       Past Medical History:  Diagnosis Date  . Asthma   . Barrett esophagus   . Deaf   . GERD (gastroesophageal reflux disease)   . Schizo affective schizophrenia ([place])     History reviewed. No pertinent surgical history.  There were no vitals filed for this visit.  Subjective Assessment - 02/10/18 1540    Subjective  "I brought Pepsi!"     Currently in Pain?  No/denies            ADULT SLP TREATMENT - 02/10/18 1610      General Information   Behavior/Cognition  Alert;Cooperative;Pleasant mood      Treatment Provided   Treatment provided  Dysphagia      Dysphagia Treatment   Temperature Spikes Noted  No    Respiratory Status  Room air    Oral Cavity - Dentition  Adequate natural dentition    Treatment Methods  Skilled observation;Therapeutic exercise;Compensation strategy training;Patient/caregiver education    Patient observed directly with PO's  Yes    Type of PO's observed  Dysphagia 3 (soft);Thin liquids    Feeding  Able to feed self    Liquids provided via  Straw    Oral Phase Signs & Symptoms  -- none noted    Pharyngeal Phase Signs & Symptoms  -- none noted    Other treatment/comments  Pt with four cans of tube feed/day. SLP suggested pt decr to 3/day and eat when she's hungry. Pt without any overt s/s aspiration today with small bites chopped  noodles/cheese/tomato sauce. SLP reminded pt that when she feels "gurgly" she will need to cough/throat clear and swallow. Mother told SLP she bought a taco pizza with a soft crust and pt ate a few bites. SLP suggested pizza with less varied textures, e.g., cheese or mushroom instead. SLP referred mother back to handouts from dietician re: modified diet - no crusty, hard, flaky, dry items on dys III diet. Pt req'd rare min A with her HEP; still having diffiuclty with consistency with Mendelsohn - max A and pt achieved correct procedure 2/7 attempts. SLP believes pt knows how to accomplish but musculature to complete is still inadequate to do so. Pt said she would keep working - SLP agreed with her. Pt maintained all aspiration precautions with SBA.      Pain Assessment   Pain Assessment  No/denies pain      Assessment / Recommendations / Plan   Plan  Continue with current plan of care      Progression Toward Goals   Progression toward goals  Progressing toward goals       SLP Education - 02/10/18 1632    Education provided  Yes    Education Details  dys III diet - taco pizza likely not on dys III,  and explained why    Person(s) Educated  Patient;Parent(s)    Methods  Explanation    Comprehension  Verbalized understanding       SLP Short Term Goals - 02/08/18 1642      SLP SHORT TERM GOAL #1   Title  pt will demo swallowing HEP with occasional min A over three sessions    Status  Not Met      SLP SHORT TERM GOAL #2   Title  pt will tell SLP 3 overt s/s aspiration PNA with rare min A over two sessions    Status  Deferred       SLP Long Term Goals - 02/10/18 1636      SLP LONG TERM GOAL #1   Title  pt will complete HEP at least 6/7 days/week, reported by mother and/or pt    Status  Achieved      SLP LONG TERM GOAL #2   Title  pt will complete swallowing HEP with occasional mod A over three sessions    Time  3    Period  Weeks    Status  On-going or 12 more visits, for all  enforced LTGs)      SLP LONG TERM GOAL #3   Title  pt will tell SLP 3 overt s/s aspiration PNA in 3 therapy sessions    Baseline  01-01-18    Time  3    Period  Weeks    Status  On-going      SLP LONG TERM GOAL #4   Title  pt will require occasional min A for HEP in 3 sessions    Time  3    Status  Achieved      SLP LONG TERM GOAL #5   Title  pt will follow swallow precautions with POs in 4 sessions     Baseline  02-04-18, 02-10-18    Time  3    Period  Weeks    Status  On-going      SLP LONG TERM GOAL #6   Title  pt will generate effortful swallow with POs with NMES placed, 80% of the time over four sessions    Status  Deferred      SLP LONG TERM GOAL #7   Title  pt will appropriately respond to hydrophonic voice during POs 80% of the time    Time  3    Period  Weeks    Status  On-going       Plan - 02/10/18 1633    Clinical Impression Statement  Today pt had chopped noodles and Pepsi followed swallow precuations today with SBA: effortful swallow, small bites/sips, multiple swallows for each bite/sip, supraglottic swallow. Pt without hydrophonic voice today. Cont'd skilled ST to assess appropriate procedure for swallowing HEP, and to educate re: hydrophonic voice, and return to PO diet (precautions, food choices, etc). Frequency will reduce to x2/week due to pt not desiring NMES for swallowing.     Speech Therapy Frequency  2x / week    Duration  4 weeks /36 total visits    Treatment/Interventions  Aspiration precaution training;Pharyngeal strengthening exercises;Internal/external aids;Multimodal communcation approach;Patient/family education;Compensatory strategies;SLP instruction and feedback       Patient will benefit from skilled therapeutic intervention in order to improve the following deficits and impairments:   Dysphagia, oropharyngeal phase    Problem List Patient Active Problem List   Diagnosis Date Noted  . Asthma with status asthmaticus 07/13/2013  .  Hypothyroidism 07/13/2013  .  Cerebral palsy ([place]) 07/13/2013  . Schizo affective schizophrenia ([place]) 07/13/2013  . 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 Anxiety 07/13/2013  . Status asthmaticus 07/13/2013    [facility] ,MS, CCC-SLP  02/10/2018, 4:37 PM  [facility] [address] [place], [place], [address] Phone: [phone]   Fax:  [phone]   Name: Briseis Aguilera MRN: [id number] Date of Birth: May 22, 1971

## 2018-02-15 ENCOUNTER — Ambulatory Visit: Payer: Medicare Other

## 2018-02-26 ENCOUNTER — Ambulatory Visit: Payer: Medicare Other | Attending: Family Medicine

## 2018-02-26 DIAGNOSIS — R1312 Dysphagia, oropharyngeal phase: Secondary | ICD-10-CM | POA: Diagnosis not present

## 2018-02-26 NOTE — Therapy (Signed)
[facility] [address] [place], [place], [address] Phone: [phone]   Fax:  [phone]  Speech Language Pathology Treatment  Patient Details  Name: Angela Nicholson MRN: [id number] Date of Birth: [birth year] Referring Provider: Kate Sable, MD   Encounter Date: 02/26/2018  End of Session - 02/26/18 1728    Visit Number  29    Number of Visits  36    Date for SLP Re-Evaluation  03/19/18    SLP Start Time  1535    SLP Stop Time   5916    SLP Time Calculation (min)  42 min    Activity Tolerance  Patient tolerated treatment well       Past Medical History:  Diagnosis Date  . Asthma   . Barrett esophagus   . Deaf   . GERD (gastroesophageal reflux disease)   . Schizo affective schizophrenia ([place])     No past surgical history on file.  There were no vitals filed for this visit.  Subjective Assessment - 02/26/18 1718    Subjective  Pt had bronchitis last week. Lungs looked fine, per mother (per MD).    Patient is accompained by:  Family member;Interpreter   mother   Currently in Pain?  No/denies            ADULT SLP TREATMENT - 02/26/18 1719      General Information   Behavior/Cognition  Alert;Cooperative;Pleasant mood      Treatment Provided   Treatment provided  Dysphagia      Dysphagia Treatment   Temperature Spikes Noted  No    Respiratory Status  Room air    Oral Cavity - Dentition  Adequate natural dentition    Treatment Methods  Skilled observation;Therapeutic exercise;Compensation strategy training;Patient/caregiver education    Patient observed directly with PO's  Yes    Type of PO's observed  Dysphagia 3 (soft);Thin liquids    Oral Phase Signs & Symptoms  --   none noted today   Pharyngeal Phase Signs & Symptoms  --   none noted today   Other treatment/comments  Pt with two mild coughing episodes between bites - SLP suspects due to pulmonary irritation/exacerbation from  bronchitis last week. No coughing/throat clearing during POs (angel hair pasta and Pepsi). Mother reports pt coughs sometimes during meals, she suspects when pt is eating too quickly. SLP told pt mother to write down when she hears pt coughing or with hydrophonic voice and bring that in to SLP. Pt was independent with swallow precautions although mother thinks pt has larger bites at home - pt denied this. SLP reviewed pt's HEP and she req'd cues with gauze exercise and with Mendelsohn. SLP used demonstration, tactile and verbal cues for these two exercises, but pt independent by session end. Pt still consuming 3 cans tube feed/day - SLP encouraged pt and mother to decr tube feed adn have pt eat more food -reiterated this was the goal.       Pain Assessment   Pain Assessment  No/denies pain      Assessment / Recommendations / Plan   Plan  Continue with current plan of care      Dysphagia Recommendations   Diet recommendations  Dysphagia 3 (mechanical soft);Thin liquid    Liquids provided via  Cup    Medication Administration  Crushed with puree    Supervision  Intermittent supervision to cue for compensatory strategies    Compensations  Slow rate;Small sips/bites;Multiple dry swallows after each bite/sip;Follow  solids with liquid;Clear throat intermittently;Effortful swallow    Postural Changes and/or Swallow Maneuvers  Hold breath before and during swallow (Supraglottic swallow)       SLP Education - 02/26/18 1727    Education provided  Yes    Education Details  MEndelsohn, gauze exercises    Person(s) Educated  Patient;Caregiver(s)    Methods  Explanation;Demonstration;Tactile cues;Verbal cues    Comprehension  Verbalized understanding;Returned demonstration;Verbal cues required;Need further instruction       SLP Short Term Goals - 02/08/18 1642      SLP SHORT TERM GOAL #1   Title  pt will demo swallowing HEP with occasional min A over three sessions    Status  Not Met      SLP  SHORT TERM GOAL #2   Title  pt will tell SLP 3 overt s/s aspiration PNA with rare min A over two sessions    Status  Deferred       SLP Long Term Goals - 02/26/18 1729      SLP LONG TERM GOAL #1   Title  pt will complete HEP at least 6/7 days/week, reported by mother and/or pt    Status  Achieved      SLP LONG TERM GOAL #2   Title  pt will complete swallowing HEP with occasional mod A over three sessions    Time  2    Period  Weeks    Status  On-going   or 12 more visits, for all enforced LTGs)     SLP LONG TERM GOAL #3   Title  pt will tell SLP 3 overt s/s aspiration PNA in 3 therapy sessions    Baseline  01-01-18    Time  2    Period  Weeks    Status  On-going      SLP LONG TERM GOAL #4   Title  pt will require occasional min A for HEP in 3 sessions    Time  3    Status  Achieved      SLP LONG TERM GOAL #5   Title  pt will follow swallow precautions with POs in 4 sessions     Baseline  02-04-18, 02-10-18, 02-26-18    Time  2    Period  Weeks    Status  On-going      SLP LONG TERM GOAL #6   Title  pt will generate effortful swallow with POs with NMES placed, 80% of the time over four sessions    Status  Deferred      SLP LONG TERM GOAL #7   Title  pt will appropriately respond to hydrophonic voice during POs 80% of the time    Time  2    Period  Weeks    Status  On-going       Plan - 02/26/18 1728    Clinical Impression Statement  Today pt had angel hair and Pepsi followed swallow precuations today independently: effortful swallow, small bites/sips, multiple swallows for each bite/sip, supraglottic swallow. Pt without hydrophonic voice today. She req'd A with Mendelsohn and with gauze exercises however was independent by session end. Cont'd skilled ST to assess appropriate procedure for swallowing HEP, and to educate re: hydrophonic voice, and return to PO diet (precautions, food choices, etc). Frequency will reduce to x2/week due to pt not desiring NMES for swallowing.      Speech Therapy Frequency  2x / week    Duration  4 weeks   /36  total visits   Treatment/Interventions  Aspiration precaution training;Pharyngeal strengthening exercises;Internal/external aids;Multimodal communcation approach;Patient/family education;Compensatory strategies;SLP instruction and feedback       Patient will benefit from skilled therapeutic intervention in order to improve the following deficits and impairments:   Dysphagia, oropharyngeal phase    Problem List Patient Active Problem List   Diagnosis Date Noted  . Asthma with status asthmaticus 07/13/2013  . Hypothyroidism 07/13/2013  . Cerebral palsy ([place]) 07/13/2013  . Schizo affective schizophrenia ([place]) 07/13/2013  . 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 Anxiety 07/13/2013  . Status asthmaticus 07/13/2013    [facility] ,[place], CCC-SLP  02/26/2018, 5:31 PM  [facility] [address] [place], [place], [address] Phone: [phone]   Fax:  [phone]   Name: Makenli Derstine MRN: [id number] Date of Birth: Aug 14, 1970

## 2018-03-02 ENCOUNTER — Ambulatory Visit: Payer: Medicare Other

## 2018-03-02 DIAGNOSIS — R1312 Dysphagia, oropharyngeal phase: Secondary | ICD-10-CM | POA: Diagnosis not present

## 2018-03-02 NOTE — Therapy (Signed)
[facility] [address] [place], [place], [address] Phone: [phone]   Fax:  [phone]  Speech Language Pathology Treatment  Patient Details  Name: Angela Nicholson MRN: [id number] Date of Birth: [birth year] Referring Provider: Kate Sable, MD   Encounter Date: 03/02/2018  End of Session - 03/02/18 1638    Visit Number  30    Number of Visits  36    Date for SLP Re-Evaluation  03/19/18    SLP Start Time  1535    SLP Stop Time   1612    SLP Time Calculation (min)  37 min       Past Medical History:  Diagnosis Date  . Asthma   . Barrett esophagus   . Deaf   . GERD (gastroesophageal reflux disease)   . Schizo affective schizophrenia ([place])     No past surgical history on file.  There were no vitals filed for this visit.         ADULT SLP TREATMENT - 03/02/18 1622      General Information   Behavior/Cognition  Alert;Cooperative;Pleasant mood      Dysphagia Treatment   Temperature Spikes Noted  No    Respiratory Status  Room air    Oral Cavity - Dentition  Adequate natural dentition    Treatment Methods  Skilled observation;Therapeutic exercise;Compensation strategy training;Patient/caregiver education    Patient observed directly with PO's  Yes    Type of PO's observed  Dysphagia 3 (soft);Thin liquids    Feeding  Able to feed self    Liquids provided via  Straw    Oral Phase Signs & Symptoms  --   none noted   Pharyngeal Phase Signs & Symptoms  Wet vocal quality   throughout session - pt aware 70% and coughed/swallowed   Other treatment/comments  Pt with hydrophonic voice throughout session - would subside after pt with bite or sip and multiple swallows but then if no POs would return. Pt ate pizza (crusts cut off), bagel, and drank water with only s/s aspiration as hydrophonic voice. PT independent with swallow precautions, and today was aware of hydrophonic voice and coughed/swallowed 70%  of the time.      Pain Assessment   Pain Assessment  No/denies pain      Assessment / Recommendations / Plan   Plan  Continue with current plan of care      Dysphagia Recommendations   Diet recommendations  Dysphagia 3 (mechanical soft);Thin liquid    Liquids provided via  Cup    Medication Administration  Crushed with puree    Supervision  Intermittent supervision to cue for compensatory strategies    Compensations  Slow rate;Small sips/bites;Multiple dry swallows after each bite/sip;Follow solids with liquid;Clear throat intermittently;Effortful swallow    Postural Changes and/or Swallow Maneuvers  Hold breath before and during swallow (Supraglottic swallow)      Progression Toward Goals   Progression toward goals  Progressing toward goals         SLP Short Term Goals - 02/08/18 1642      SLP SHORT TERM GOAL #1   Title  pt will demo swallowing HEP with occasional min A over three sessions    Status  Not Met      SLP SHORT TERM GOAL #2   Title  pt will tell SLP 3 overt s/s aspiration PNA with rare min A over two sessions    Status  Deferred       SLP  Long Term Goals - 03/02/18 1641      SLP LONG TERM GOAL #1   Title  pt will complete HEP at least 6/7 days/week, reported by mother and/or pt    Status  Achieved      SLP LONG TERM GOAL #2   Title  pt will complete swallowing HEP with occasional mod A over three sessions    Time  2    Period  Weeks    Status  On-going   or 12 more visits, for all enforced LTGs)     SLP LONG TERM GOAL #3   Title  pt will tell SLP 3 overt s/s aspiration PNA in 3 therapy sessions    Baseline  01-01-18    Time  2    Period  Weeks    Status  On-going      SLP LONG TERM GOAL #4   Title  pt will require occasional min A for HEP in 3 sessions    Time  3    Status  Achieved      SLP LONG TERM GOAL #5   Title  pt will follow swallow precautions with POs in 4 sessions     Status  Achieved      SLP LONG TERM GOAL #6   Title  pt will  generate effortful swallow with POs with NMES placed, 80% of the time over four sessions    Status  Deferred      SLP LONG TERM GOAL #7   Title  pt will appropriately respond to hydrophonic voice during POs 80% of the time    Time  2    Period  Weeks    Status  On-going       Plan - 03/02/18 1639    Clinical Impression Statement  Today pt had dys III and thin liquids with precautions: effortful swallow, small bites/sips, multiple swallows for each bite/sip, supraglottic swallow. Pt with hydrophonic voice today throughout session, but awareness was 70%, as she coughed and swallowed. Mother stated at the end of session, "I'm feeling a lot better about (what to have her eat) now." Cont'd skilled ST to assess appropriate procedure for swallowing HEP, and to educate re: hydrophonic voice, and return to PO diet (precautions, food choices, etc). Frequency will reduce to x2/week due to pt not desiring NMES for swallowing.     Speech Therapy Frequency  2x / week    Duration  4 weeks   /36 total visits   Treatment/Interventions  Aspiration precaution training;Pharyngeal strengthening exercises;Internal/external aids;Multimodal communcation approach;Patient/family education;Compensatory strategies;SLP instruction and feedback       Patient will benefit from skilled therapeutic intervention in order to improve the following deficits and impairments:   Dysphagia, oropharyngeal phase    Problem List Patient Active Problem List   Diagnosis Date Noted  . Asthma with status asthmaticus 07/13/2013  . Hypothyroidism 07/13/2013  . Cerebral palsy ([place]) 07/13/2013  . Schizo affective schizophrenia ([place]) 07/13/2013  . 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 Anxiety 07/13/2013  . Status asthmaticus 07/13/2013    [facility] ,MS, CCC-SLP  03/02/2018, 4:42 PM  [facility] [address] [place], [place], [address] Phone: [phone]   Fax:  [phone]   Name: Angela Nicholson MRN: [id number] Date of Birth: Mar 14, 1971

## 2018-03-03 ENCOUNTER — Ambulatory Visit: Payer: Medicare Other

## 2018-03-03 DIAGNOSIS — R1312 Dysphagia, oropharyngeal phase: Secondary | ICD-10-CM

## 2018-03-03 NOTE — Therapy (Signed)
[facility] [address] , [place], [address] Phone: [phone]   Fax:  [phone]  Speech Language Pathology Treatment  Patient Details  Name: Angela Nicholson MRN: [id number] Date of Birth: 01-May-1971 Referring Provider: Kate Sable, MD   Encounter Date: 03/03/2018  End of Session - 03/03/18 1652    Visit Number  31    Number of Visits  36    Date for SLP Re-Evaluation  03/19/18    SLP Start Time  1450    SLP Stop Time   1530    SLP Time Calculation (min)  40 min    Activity Tolerance  Patient tolerated treatment well       Past Medical History:  Diagnosis Date  . Asthma   . Barrett esophagus   . Deaf   . GERD (gastroesophageal reflux disease)   . Schizo affective schizophrenia ([place])     No past surgical history on file.  There were no vitals filed for this visit.         ADULT SLP TREATMENT - 03/03/18 1646      General Information   Behavior/Cognition  Alert;Cooperative;Pleasant mood      Treatment Provided   Treatment provided  Dysphagia      Dysphagia Treatment   Temperature Spikes Noted  No    Respiratory Status  Room air    Oral Cavity - Dentition  Adequate natural dentition    Treatment Methods  Skilled observation;Therapeutic exercise;Compensation strategy training;Patient/caregiver education    Patient observed directly with PO's  Yes    Type of PO's observed  Dysphagia 3 (soft);Thin liquids    Feeding  Able to feed self    Liquids provided via  Straw    Oral Phase Signs & Symptoms  --   none noted   Pharyngeal Phase Signs & Symptoms  --   none noted   Other treatment/comments  Pt was independent today with swallow precautions, mother reports pt has not used PEG in 2 days. SLP reminded mother to cont to check pt's weight and to flush PEG daily. SLP had to provide mod cues to pt today for [facility] as she was breathing and vocalizing during supposed thyroid elevation  hold. Pt was independent by session end. Mother reports pt doing more coughing at home. SLP ascertained pt watching television when eating - SLP told pt NO watching TV due to distraction while eating. Pt demo'd understanding.       Assessment / Recommendations / Plan   Plan  Continue with current plan of care      Dysphagia Recommendations   Diet recommendations  Dysphagia 3 (mechanical soft);Thin liquid    Liquids provided via  Cup;Straw    Medication Administration  Crushed with puree    Supervision  Intermittent supervision to cue for compensatory strategies    Compensations  Slow rate;Small sips/bites;Multiple dry swallows after each bite/sip;Follow solids with liquid;Clear throat intermittently;Effortful swallow    Postural Changes and/or Swallow Maneuvers  Hold breath before and during swallow (Supraglottic swallow)      Progression Toward Goals   Progression toward goals  Progressing toward goals       SLP Education - 03/03/18 1651    Education provided  Yes    Education Details  no distractions while eating (TV, talking/conversation)    Person(s) Educated  Patient;Parent(s)    Methods  Explanation    Comprehension  Verbalized understanding;Need further instruction       SLP Short  Term Goals - 02/08/18 1642      SLP SHORT TERM GOAL #1   Title  pt will demo swallowing HEP with occasional min A over three sessions    Status  Not Met      SLP SHORT TERM GOAL #2   Title  pt will tell SLP 3 overt s/s aspiration PNA with rare min A over two sessions    Status  Deferred       SLP Long Term Goals - 03/03/18 1655      SLP LONG TERM GOAL #1   Title  pt will complete HEP at least 6/7 days/week, reported by mother and/or pt    Status  Achieved      SLP LONG TERM GOAL #2   Title  pt will complete swallowing HEP with occasional mod A over three sessions    Time  2    Period  Weeks    Status  On-going   or 12 more visits, for all enforced LTGs)     SLP LONG TERM GOAL #3    Title  pt will tell SLP 3 overt s/s aspiration PNA in 3 therapy sessions    Baseline  01-01-18, 03-03-18    Time  2    Period  Weeks    Status  On-going      SLP LONG TERM GOAL #4   Title  pt will require occasional min A for HEP in 3 sessions    Time  3    Status  Achieved      SLP LONG TERM GOAL #5   Title  pt will follow swallow precautions with POs in 4 sessions     Status  Achieved      Additional Long Term Goals   Additional Long Term Goals  Yes      SLP LONG TERM GOAL #6   Title  pt will generate effortful swallow with POs with NMES placed, 80% of the time over four sessions    Status  Deferred      SLP LONG TERM GOAL #7   Title  pt will appropriately respond to hydrophonic voice during POs 80% of the time    Time  2    Period  Weeks    Status  On-going      SLP LONG TERM GOAL #8   Title  pt will follow precautions at home demonstrated by decr'd/no coughing during meals, per pt/parent report over 2 sessions    Time  2    Period  Weeks    Status  New       Plan - 03/03/18 1653    Clinical Impression Statement  Today pt had dys III and thin liquids with precautions: effortful swallow, small bites/sips, multiple swallows for each bite/sip, supraglottic swallow. Pt without hydrophonic voice today during POs, but developed approx 25% of the time after 3 swallows of POs. Pt req'd mod A for [place] today, after completing it independently yesterday. Cont'd skilled ST to assess appropriate procedure for swallowing HEP, and to educate re: hydrophonic voice, and return to PO diet (precautions, food choices, etc).     Speech Therapy Frequency  2x / week    Duration  4 weeks   /36 total visits   Treatment/Interventions  Aspiration precaution training;Pharyngeal strengthening exercises;Internal/external aids;Multimodal communcation approach;Patient/family education;Compensatory strategies;SLP instruction and feedback       Patient will benefit from skilled therapeutic  intervention in order to improve the following deficits and impairments:  Dysphagia, oropharyngeal phase    Problem List Patient Active Problem List   Diagnosis Date Noted  . Asthma with status asthmaticus 07/13/2013  . Hypothyroidism 07/13/2013  . Cerebral palsy ([place]) 07/13/2013  . Schizo affective schizophrenia ([place]) 07/13/2013  . 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 Anxiety 07/13/2013  . Status asthmaticus 07/13/2013    [facility] ,MS, CCC-SLP  03/03/2018, 4:58 PM  [facility] [address] [place], [place], [address] Phone: [phone]   Fax:  [phone]   Name: Angela Nicholson MRN: [id number] Date of Birth: October 28, 1970

## 2018-03-05 ENCOUNTER — Ambulatory Visit: Payer: Medicare Other

## 2018-03-10 ENCOUNTER — Ambulatory Visit: Payer: Medicare Other

## 2018-03-10 DIAGNOSIS — R1312 Dysphagia, oropharyngeal phase: Secondary | ICD-10-CM | POA: Diagnosis not present

## 2018-03-10 NOTE — Therapy (Signed)
[facility] [address] [place], [place], [address] Phone: [phone]   Fax:  [phone]  Speech Language Pathology Treatment  Patient Details  Name: Angela Nicholson MRN: [id number] Date of Birth: Mar 11, 1971 Referring Provider: Kate Sable, MD   Encounter Date: 03/10/2018  End of Session - 03/10/18 1717    Visit Number  32    Number of Visits  36    Date for SLP Re-Evaluation  03/19/18    SLP Start Time  1450    SLP Stop Time   1525    SLP Time Calculation (min)  35 min    Activity Tolerance  Patient tolerated treatment well       Past Medical History:  Diagnosis Date  . Asthma   . Barrett esophagus   . Deaf   . GERD (gastroesophageal reflux disease)   . Schizo affective schizophrenia ([place])     History reviewed. No pertinent surgical history.  There were no vitals filed for this visit.  Subjective Assessment - 03/10/18 1707    Subjective  Pt coughing at end of session today, 4-5 mintues after last POs. ?GERD? Pt to see GI MD Friday.    Patient is accompained by:  Family member;Interpreter    Currently in Pain?  No/denies            ADULT SLP TREATMENT - 03/10/18 0001      General Information   Behavior/Cognition  Alert;Cooperative;Pleasant mood      Treatment Provided   Treatment provided  Dysphagia      Dysphagia Treatment   Temperature Spikes Noted  No    Respiratory Status  Room air    Oral Cavity - Dentition  Adequate natural dentition    Treatment Methods  Skilled observation;Therapeutic exercise;Compensation strategy training;Patient/caregiver education    Patient observed directly with PO's  Yes    Type of PO's observed  Dysphagia 3 (soft);Thin liquids    Feeding  Able to feed self    Liquids provided via  Straw    Oral Phase Signs & Symptoms  --   none noted   Pharyngeal Phase Signs & Symptoms  --   none noted    Other treatment/comments  Pt followed precatutions  indpendently today with POs. Mother tells SLP pt coughs more with meals at home than in [place]. SLP suggested closer supervision with pt - mother stated pt's psychologist suggested positive reinforcement with gold stars if pt cuts food up smaller, which mother states is a positive indicator to less coughing. Pt was independnet with HEP today, except for [place] (min A rarely). SLP told mother to keep foof journal and explained/educated her how to use this. Mother was appreciative.       Assessment / Recommendations / Plan   Plan  Continue with current plan of care   at once/week due to progress     Dysphagia Recommendations   Diet recommendations  Dysphagia 3 (mechanical soft);Thin liquid    Liquids provided via  Cup;Straw    Medication Administration  Crushed with puree    Supervision  Intermittent supervision to cue for compensatory strategies    Compensations  Slow rate;Small sips/bites;Multiple dry swallows after each bite/sip;Follow solids with liquid;Clear throat intermittently;Effortful swallow    Postural Changes and/or Swallow Maneuvers  Hold breath before and during swallow (Supraglottic swallow)      Progression Toward Goals   Progression toward goals  Progressing toward goals       SLP Education -  03/10/18 1717    Education provided  Yes    Education Details  no distractions while eating (talking), food journal    Person(s) Educated  Patient;Parent(s)    Methods  Explanation    Comprehension  Verbalized understanding       SLP Short Term Goals - 02/08/18 1642      SLP SHORT TERM GOAL #1   Title  pt will demo swallowing HEP with occasional min A over three sessions    Status  Not Met      SLP SHORT TERM GOAL #2   Title  pt will tell SLP 3 overt s/s aspiration PNA with rare min A over two sessions    Status  Deferred       SLP Long Term Goals - 03/10/18 1720      SLP LONG TERM GOAL #1   Title  pt will complete HEP at least 6/7 days/week, reported by mother and/or  pt    Status  Achieved      SLP LONG TERM GOAL #2   Title  pt will complete swallowing HEP with occasional mod A over three sessions    Baseline  03-10-18    Time  1    Period  Weeks    Status  On-going   or 12 more visits, for all enforced LTGs)     SLP LONG TERM GOAL #3   Title  pt will tell SLP 3 overt s/s aspiration PNA in 3 therapy sessions    Baseline  01-01-18, 03-03-18    Time  1    Period  Weeks    Status  On-going      SLP LONG TERM GOAL #4   Title  pt will require occasional min A for HEP in 3 sessions    Status  Achieved      SLP LONG TERM GOAL #5   Title  pt will follow swallow precautions with POs in 4 sessions     Status  Achieved      SLP LONG TERM GOAL #6   Title  pt will generate effortful swallow with POs with NMES placed, 80% of the time over four sessions    Status  Deferred      SLP LONG TERM GOAL #7   Title  pt will appropriately respond to hydrophonic voice during POs 80% of the time    Time  1    Period  Weeks    Status  On-going      SLP LONG TERM GOAL #8   Title  pt will follow precautions at home demonstrated by decr'd/no coughing during meals, per pt/parent report over 2 sessions    Time  1    Period  Weeks    Status  On-going       Plan - 03/10/18 1718    Clinical Impression Statement  Today pt had dys III and thin liquids with precautions followed independently: effortful swallow, small bites/sips, multiple swallows for each bite/sip, supraglottic swallow. Pt without hydrophonic voice today during POs. She req'd min A rarely for [place] today. SLP educated mother today re: food journal. Pt is progressing and requires x1/week. Mother/pt agree. Likely no more than 1-2 more sessions necessary. Cont'd skilled ST to assess appropriate procedure for swallowing HEP, and to educate re: hydrophonic voice, and return to PO diet (precautions, food choices, etc).     Speech Therapy Frequency  2x / week    Duration  4 weeks   /36  total visits    Treatment/Interventions  Aspiration precaution training;Pharyngeal strengthening exercises;Internal/external aids;Multimodal communcation approach;Patient/family education;Compensatory strategies;SLP instruction and feedback       Patient will benefit from skilled therapeutic intervention in order to improve the following deficits and impairments:   Dysphagia, oropharyngeal phase    Problem List Patient Active Problem List   Diagnosis Date Noted  . Asthma with status asthmaticus 07/13/2013  . Hypothyroidism 07/13/2013  . Cerebral palsy ([place]) 07/13/2013  . Schizo affective schizophrenia ([place]) 07/13/2013  . 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 Anxiety 07/13/2013  . Status asthmaticus 07/13/2013    [facility] ,MS, CCC-SLP  03/10/2018, 5:21 PM  [facility] [address] [place], [place], [address] Phone: [phone]   Fax:  [phone]   Name: Angela Nicholson MRN: [id number] Date of Birth: 1971/05/11

## 2018-03-15 ENCOUNTER — Ambulatory Visit: Payer: Medicare Other

## 2018-03-15 DIAGNOSIS — R1312 Dysphagia, oropharyngeal phase: Secondary | ICD-10-CM | POA: Diagnosis not present

## 2018-03-15 NOTE — Therapy (Signed)
[facility] [address] [place], [place], [address] Phone: [phone]   Fax:  [phone]  Speech Language Pathology Treatment  Patient Details  Name: Angela Nicholson MRN: [id number] Date of Birth: November 09, 1970 Referring Provider: Kate Sable, MD   Encounter Date: 03/15/2018  End of Session - 03/15/18 1625    Visit Number  33    Number of Visits  36    Date for SLP Re-Evaluation  03/19/18    SLP Start Time  1535    SLP Stop Time   1606    SLP Time Calculation (min)  31 min    Activity Tolerance  Patient tolerated treatment well       Past Medical History:  Diagnosis Date  . Asthma   . Barrett esophagus   . Deaf   . GERD (gastroesophageal reflux disease)   . Schizo affective schizophrenia ([place])     No past surgical history on file.  There were no vitals filed for this visit.  Subjective Assessment - 03/15/18 1616    Subjective  Mom states pt is performing the process of the swallow precautions much better at home since last session.     Currently in Pain?  No/denies            ADULT SLP TREATMENT - 03/15/18 1617      General Information   Behavior/Cognition  Alert;Cooperative;Pleasant mood      Treatment Provided   Treatment provided  Dysphagia      Dysphagia Treatment   Temperature Spikes Noted  No    Respiratory Status  Room air    Oral Cavity - Dentition  Adequate natural dentition    Treatment Methods  Skilled observation;Patient/caregiver education;Therapeutic exercise    Patient observed directly with PO's  Yes    Type of PO's observed  Dysphagia 3 (soft);Thin liquids    Liquids provided via  Straw    Oral Phase Signs & Symptoms  --   none noted   Pharyngeal Phase Signs & Symptoms  Immediate cough   1/12 sips   Other treatment/comments  pt performed HEP with rare min A ([place]). SLP used tactile cues (pt wearing glove) to crystalize procedure with pt. Pt performed  adequately by session end. All other exercises independent. Pt told pt and mother that any food, rated 7 or above on 1-10 scale of how easy to get down, could be tried again. Any food rated 6 or below it would be best to wait 2 weeks before attempting again. Mother is beginning to plan meals around these ratings and is now comfortable with what types of foods pt is able to eat better. Pt with wet voice today - when SLP pointed this out, pt noted she felt it. SLP reminded pt to cough and swallow whenever she feels "wet" voice. Pt independent with precautions. Pt/mother told SLP overt s/s aspiration PNA.       Assessment / Recommendations / Plan   Plan  Continue with current plan of care   possible d/c next 1-2 sessions     Dysphagia Recommendations   Diet recommendations  Dysphagia 3 (mechanical soft);Thin liquid    Liquids provided via  Cup;Straw    Medication Administration  Crushed with puree    Supervision  Intermittent supervision to cue for compensatory strategies    Compensations  Slow rate;Small sips/bites;Multiple dry swallows after each bite/sip;Follow solids with liquid;Clear throat intermittently;Effortful swallow    Postural Changes and/or Swallow Maneuvers  Hold breath  before and during swallow (Supraglottic swallow)         SLP Short Term Goals - 02/08/18 1642      SLP SHORT TERM GOAL #1   Title  pt will demo swallowing HEP with occasional min A over three sessions    Status  Not Met      SLP SHORT TERM GOAL #2   Title  pt will tell SLP 3 overt s/s aspiration PNA with rare min A over two sessions    Status  Deferred       SLP Long Term Goals - 03/15/18 1630      SLP LONG TERM GOAL #1   Title  pt will complete HEP at least 6/7 days/week, reported by mother and/or pt    Status  Achieved      SLP LONG TERM GOAL #2   Title  pt will complete swallowing HEP with occasional mod A over three sessions    Baseline  03-10-18, 03-15-18    Time  1    Period  Weeks    Status   On-going   or 12 more visits, for all enforced LTGs)     SLP LONG TERM GOAL #3   Title  pt will tell SLP 3 overt s/s aspiration PNA in 3 therapy sessions    Status  Achieved      SLP LONG TERM GOAL #4   Title  pt will require occasional min A for HEP in 3 sessions    Status  Achieved      SLP LONG TERM GOAL #5   Title  pt will follow swallow precautions with POs in 4 sessions     Status  Achieved      SLP LONG TERM GOAL #6   Title  pt will generate effortful swallow with POs with NMES placed, 80% of the time over four sessions    Status  Deferred      SLP LONG TERM GOAL #7   Title  pt will appropriately respond to hydrophonic voice during POs 80% of the time    Time  1    Period  Weeks    Status  On-going      SLP LONG TERM GOAL #8   Title  pt will follow precautions at home demonstrated by decr'd/no coughing during meals, per pt/parent report over 2 sessions    Time  1    Period  Weeks    Status  On-going   03-15-18      Plan - 03/15/18 1628    Clinical Impression Statement  Today pt had dys III and thin liquids with precautions again followed independently: effortful swallow, small bites/sips, multiple swallows for each bite/sip, supraglottic swallow. She req'd min A rarely for [place] today. See "skilled treatment" for details.  Likely no more than 1-2 more sessions necessary. Cont'd skilled ST to assess appropriate procedure for swallowing HEP, and to educate re: hydrophonic voice, and return to PO diet (precautions, food choices, etc).     Speech Therapy Frequency  2x / week    Duration  4 weeks   /36 total visits   Treatment/Interventions  Aspiration precaution training;Pharyngeal strengthening exercises;Internal/external aids;Multimodal communcation approach;Patient/family education;Compensatory strategies;SLP instruction and feedback       Patient will benefit from skilled therapeutic intervention in order to improve the following deficits and impairments:    Dysphagia, oropharyngeal phase    Problem List Patient Active Problem List   Diagnosis Date Noted  . Asthma with  status asthmaticus 07/13/2013  . Hypothyroidism 07/13/2013  . Cerebral palsy ([place]) 07/13/2013  . Schizo affective schizophrenia ([place]) 07/13/2013  . 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 Anxiety 07/13/2013  . Status asthmaticus 07/13/2013    [facility] ,MS, CCC-SLP  03/15/2018, 4:32 PM  [facility] [address] [place], [place], [address] Phone: [phone]   Fax:  [phone]   Name: Angela Nicholson MRN: [id number] Date of Birth: 1971-06-03

## 2018-03-24 ENCOUNTER — Ambulatory Visit: Payer: Medicare Other | Attending: Family Medicine

## 2018-03-24 DIAGNOSIS — R1312 Dysphagia, oropharyngeal phase: Secondary | ICD-10-CM | POA: Diagnosis present

## 2018-03-24 NOTE — Therapy (Signed)
[facility] [address] [place], [place], [address] Phone: [phone]   Fax:  [phone]  Speech Language Pathology Treatment/Discharge Summary  Patient Details  Name: Angela Nicholson MRN: [id number] Date of Birth: 1970-12-06 Referring Provider: Kate Sable, MD   Encounter Date: 03/24/2018  End of Session - 03/24/18 1553    Visit Number  34    Number of Visits  36    Date for SLP Re-Evaluation  03/24/18    SLP Start Time  1450    SLP Stop Time   1531    SLP Time Calculation (min)  41 min    Activity Tolerance  Patient tolerated treatment well       Past Medical History:  Diagnosis Date  . Asthma   . Barrett esophagus   . Deaf   . GERD (gastroesophageal reflux disease)   . Schizo affective schizophrenia ([place])     No past surgical history on file.  There were no vitals filed for this visit.  Subjective Assessment - 03/24/18 1541    Subjective  Mom reports pt coughing after mealtimes even to point of having to splash water on her face.    Patient is accompained by:  Family member;Interpreter   mother    Currently in Pain?  No/denies            ADULT SLP TREATMENT - 03/24/18 1544      General Information   Behavior/Cognition  Alert;Cooperative;Pleasant mood      Treatment Provided   Treatment provided  Dysphagia      Dysphagia Treatment   Temperature Spikes Noted  No    Respiratory Status  Room air    Oral Cavity - Dentition  Adequate natural dentition    Treatment Methods  Skilled observation;Therapeutic exercise;Patient/caregiver education    Patient observed directly with PO's  Yes    Type of PO's observed  Dysphagia 3 (soft);Thin liquids    Liquids provided via  Straw    Pharyngeal Phase Signs & Symptoms  Immediate cough   2/18 sips   Other treatment/comments  HEP was done with rare min A (Mendelsohn was correct 1/5 reps). Pt was independent with all other exercises. Pt showed SLP  the food journal she was keeping, this was consistent since last visit.  Pt indicated twice correctly when wet voice was heard. SLP cued for 2 more times and mother reported hearing each. NEcessary to cue pt to swallow after throat clear/cough for "wet" voice. Pt with violent coughing for approx 3 minutes 7-8 minutes after POs. ?GERD flare-up? SLP enouraged mother to call GI MD to notify. SLP reviewed overt s/s aspiration PNA with mother and pt.      Assessment / Recommendations / Plan   Plan  Discharge SLP treatment due to (comment)   pt has reached max rehab potential at this time     Dysphagia Recommendations   Diet recommendations  Dysphagia 3 (mechanical soft);Thin liquid    Liquids provided via  Cup;Straw    Medication Administration  Crushed with puree    Supervision  Intermittent supervision to cue for compensatory strategies    Compensations  Slow rate;Small sips/bites;Multiple dry swallows after each bite/sip;Follow solids with liquid;Clear throat intermittently;Effortful swallow    Postural Changes and/or Swallow Maneuvers  Hold breath before and during swallow (Supraglottic swallow)      Progression Toward Goals   Progression toward goals  --   d/c day - see goal summary  SLP Education - 03/24/18 1552    Education provided  Yes    Education Details  mendelsohn procedure, overt s/s aspiration PNA    Person(s) Educated  Patient;Parent(s)    Methods  Explanation;Demonstration;Verbal cues    Comprehension  Verbalized understanding;Returned demonstration;Verbal cues required       SLP Short Term Goals - 02/08/18 1642      SLP SHORT TERM GOAL #1   Title  pt will demo swallowing HEP with occasional min A over three sessions    Status  Not Met      SLP SHORT TERM GOAL #2   Title  pt will tell SLP 3 overt s/s aspiration PNA with rare min A over two sessions    Status  Deferred       SLP Long Term Goals - 03/24/18 1556      SLP LONG TERM GOAL #1   Title  pt will  complete HEP at least 6/7 days/week, reported by mother and/or pt    Status  Achieved      SLP LONG TERM GOAL #2   Title  pt will complete swallowing HEP with occasional mod A over three sessions    Status  Achieved   or 12 more visits, for all enforced LTGs)     SLP LONG TERM GOAL #3   Title  pt will tell SLP 3 overt s/s aspiration PNA in 3 therapy sessions    Status  Achieved      SLP LONG TERM GOAL #4   Title  pt will require occasional min A for HEP in 3 sessions    Status  Achieved      SLP LONG TERM GOAL #5   Title  pt will follow swallow precautions with POs in 4 sessions     Status  Achieved      SLP LONG TERM GOAL #6   Title  pt will generate effortful swallow with POs with NMES placed, 80% of the time over four sessions    Status  Deferred      SLP LONG TERM GOAL #7   Title  pt will appropriately respond to hydrophonic voice during POs 80% of the time    Status  Partially Met      SLP LONG TERM GOAL #8   Title  pt will follow precautions at home demonstrated by decr'd/no coughing during meals, per pt/parent report over 2 sessions    Status  Partially Met   03-15-18; pt requires intermittent supervision with precautions, per mom      Plan - 03/24/18 1553    Clinical Impression Statement  Today pt had dys III and thin liquids with precautions again followed independently: effortful swallow, small bites/sips, multiple swallows for each bite/sip, supraglottic swallow. See treatment section above for more details. Pt coughed violently approx 7-8 minutes after finishing POs, for approx 3 minutes. Mother stated this is what pt does at home and pt needs to splash water on face to cool down- SLP questions GERD? She again req'd min A rarely for [place] today. LTGs will be enforced for this session and pt has reached max rehab potential at this time and will be d/c'd Likely no more than 1-2 more sessions necessary. Cont'd skilled ST to assess appropriate procedure for swallowing  HEP, and to educate re: hydrophonic voice, and return to PO diet (precautions, food choices, etc).     Duration  --   /36 total visits   Treatment/Interventions  Aspiration precaution training;Pharyngeal  strengthening exercises;Internal/external aids;Multimodal communcation approach;Patient/family education;Compensatory strategies;SLP instruction and feedback       Patient will benefit from skilled therapeutic intervention in order to improve the following deficits and impairments:   Dysphagia, oropharyngeal phase - Plan: SLP plan of care cert/re-cert   SPEECH THERAPY DISCHARGE SUMMARY  Visits from Start of Care: 34  Current functional level related to goals / functional outcomes: Pt began NPO and is now consuming dys III/thin with precautions.  SLP will complete today's session with current LTGs but pt will be d/c'd after today's session for meeting current rehab potential. She is performing HEP well and with adequate frequency, however continues to have diffiuclty from session to session with [facility] exercise. Pt's mother has been educated re: procedure for HEP as well as for pt's swallow precautions and reports she has to cue pt periodically at home - pt is independent with precautions in the clinical setting. Pt and mother are both better at awareness of hydrophonic voice ("wet" voice) and will respond in an appropriate manner (cough or throat clear and reswallow) when noted.   Remaining deficits: Dysphagia.   Education / Equipment: HEP for dysphagia, swallow precautions, "wet" voice physiology, overt s/s aspiration PNA.   Plan: Patient agrees to discharge.  Patient goals were partially met. Patient is being discharged due to                                                     ?????meeting current rehab potential.       Problem List Patient Active Problem List   Diagnosis Date Noted  . Asthma with status asthmaticus 07/13/2013  . Hypothyroidism 07/13/2013  . Cerebral palsy  ([place]) 07/13/2013  . Schizo affective schizophrenia ([place]) 07/13/2013  . 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 Anxiety 07/13/2013  . Status asthmaticus 07/13/2013    [facility] ,MS, CCC-SLP  03/24/2018, 3:58 PM  [facility] [address] [place], [place], [address] Phone: [phone]   Fax:  [phone]   Name: Angela Nicholson MRN: [id number] Date of Birth: [birth year]

## 2018-03-24 NOTE — Patient Instructions (Signed)
Signs of Aspiration Pneumonia   . Chest pain/tightness . Fever (can be low grade) . Cough  o With foul-smelling phlegm (sputum) o With sputum containing pus or blood o With greenish sputum . Fatigue  . Shortness of breath  . Wheezing   **IF YOU HAVE THESE SIGNS, CONTACT YOUR DOCTOR OR GO TO THE EMERGENCY DEPARTMENT OR URGENT CARE AS SOON AS POSSIBLE**    =========================================================  You must swallow after you cough or clear your throat when you feel gurgly.

## 2018-06-21 ENCOUNTER — Ambulatory Visit: Payer: Medicare Other | Attending: Family Medicine | Admitting: Occupational Therapy

## 2019-07-21 ENCOUNTER — Other Ambulatory Visit: Payer: Self-pay

## 2019-07-21 ENCOUNTER — Ambulatory Visit: Payer: Medicare Other | Attending: Physician Assistant

## 2019-07-21 DIAGNOSIS — R1312 Dysphagia, oropharyngeal phase: Secondary | ICD-10-CM | POA: Diagnosis not present

## 2019-07-21 NOTE — Therapy (Signed)
[facility] [address] [place], [place], [address] Phone: [phone]   Fax:  [phone]  Speech Language Pathology Evaluation  Patient Details  Name: Angela Nicholson MRN: [id number] Date of Birth: 03/05/1971 Referring Provider (SLP): Charolett BumpersAnthony Pleasant, PA-C   Encounter Date: 07/21/2019  End of Session - 07/21/19 1548    Visit Number  1    Number of Visits  17    Date for SLP Re-Evaluation  10/19/19   90 days   SLP Start Time  1405    SLP Stop Time   1453    SLP Time Calculation (min)  48 min    Activity Tolerance  Patient tolerated treatment well       Past Medical History:  Diagnosis Date  . Asthma   . Barrett esophagus   . Deaf   . GERD (gastroesophageal reflux disease)   . Schizo affective schizophrenia ([facility])     History reviewed. No pertinent surgical history.  There were no vitals filed for this visit.      SLP Evaluation [facility] - 07/21/19 1405      SLP Visit Information   SLP Received On  07/21/19    Referring Provider (SLP)  Charolett BumpersAnthony Pleasant, PA-C    Onset Date  > 1 year ago    Medical Diagnosis  dysphagia       Subjective   Patient/Family Stated Goal  Improve swallow safety      General Information   HPI  Pt with deafness and CP well-known to this SLP from previous ST course ending in August 2019. 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 Over the last couple of months mother has noted pt has incr'd frequency coughing with meals - pt had gotten lax about swallow precautions and about perfroming swallowing maintenance exercises. MBSS done 06-21-19 with silent trace aspiration of thin and also on larger bolus puree (thought puree aspiration was test-specific and not representative of pt swallow). Rec regular diet with nectar liquids with slow rate, small bites/sips, swallow-throat clear-swallow, and reflux  precautions.      Balance Screen   Has the patient fallen in the past 6 months  No      Prior Functional Status   Cognitive/Linguistic Baseline  Within functional limits      Cognition   Overall Cognitive Status  Within Functional Limits for tasks assessed      Oral Motor/Sensory Function   Overall Oral Motor/Sensory Function  deferred due to COVID -19 precautions      Motor Speech   Overall Motor Speech  Impaired   likely due to pt deafness     Results and recommendations from pt's MBSS dated 06-21-19 were reiewed with pt/mother via interpreter.  SLp developed a specialized HEP from the results of pt's MBSS on 06-21-19 and these exercises were performed with SLP ensuring pt performed them as well as possible. SLP had to modify super-supraglottic due to pt not performing cough - SLP allowed a throat clear instead. Pt will also cont to require assistance with Mendelsohn exercise during initial ST sessions until she masters this exercise.  SLP reviewed swallow precautions and pt req'd more extensive review of swallow/throat clear/swallow precaution.                SLP Education - 07/21/19 1547    Education Details  swallow precautions, HEP procedure, danger of not  following swallow precautions    Person(s) Educated  Patient;Parent(s)    Methods  Explanation;Demonstration;Verbal cues;Handout    Comprehension  Verbalized understanding;Returned demonstration;Verbal cues required;Need further instruction       SLP Short Term Goals - 07/21/19 1551      SLP SHORT TERM GOAL #1   Title  pt will demo swallowing HEP with occasional mod A over three sessions    Time  4    Period  Weeks    Status  New      SLP SHORT TERM GOAL #2   Title  pt will demo swallow precautions with POs during 3 sessions, with rare min-mod A    Time  4    Period  Weeks    Status  New       SLP Long Term Goals - 07/21/19 1552      SLP LONG TERM GOAL #1   Title  pt will complete HEP with  occasional min-mod A over three sessions    Time  8    Period  Weeks   (or 17 sessions, for all LTGs)   Status  New      SLP LONG TERM GOAL #2   Title  pt will follow swallow precautions with rare min A in 3 sessions    Time  8    Period  Weeks    Status  New      SLP LONG TERM GOAL #3   Title  pt will tell SLP how often she is to complete HEP after her next modified barium swallow exam, in 3 sessions    Time  8    Period  Weeks    Status  New       Plan - 07/21/19 1548    Clinical Impression Statement  Pt presents with new onset of decr'd safety with oropharyngeal swallowing c/b silent trace aspiration with thin liquids, premature spillage, and trace to mild vallecular an pyriform residues-worse with thicker POs. see "HPI" for details from that 06-21-19 exam. Pt would benefit from skilled ST to target accuracy of HEP procedure and habitualizing close adherence to swallow precautions. Additionally, pt's mother would benefit from incr'ing her skills at monitoring and assisting pt with her performance.    Speech Therapy Frequency  2x / week    Duration  --   8 weeks or 17 visits   Treatment/Interventions  Aspiration precaution training;Pharyngeal strengthening exercises;Diet toleration management by SLP;Internal/external aids;Patient/family education;Compensatory strategies;Cueing hierarchy;SLP instruction and feedback;Trials of upgraded texture/liquids    Potential to Achieve Goals  Fair    Potential Considerations  Co-morbidities;Severity of impairments    SLP Home Exercise Plan  provided today    Consulted and Agree with Plan of Care  Patient       Patient will benefit from skilled therapeutic intervention in order to improve the following deficits and impairments:   Dysphagia, oropharyngeal phase    Problem List Patient Active Problem List   Diagnosis Date Noted  . Asthma with status asthmaticus 07/13/2013  . Hypothyroidism 07/13/2013  . Cerebral palsy ([facility]) 07/13/2013  .  Schizo affective schizophrenia ([facility]) 07/13/2013  . 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 Anxiety 07/13/2013  . Status asthmaticus 07/13/2013    [facility] ,MS, CCC-SLP  07/21/2019, 4:08 PM   [facility] [address] [place], [place], [address] Phone: [phone]   Fax:  [phone]  Name: Angela Nicholson MRN: [id number] Date of Birth: Feb 05, 1971

## 2019-07-21 NOTE — Patient Instructions (Addendum)
SWALLOWING EXERCISES Do these until your next swallow test  1. HARD Swallows- strong    - Take sips if you need to - 12-15 times, 3 times a day, and use whenever you eat or drink  2. Swallow with your tongue sticking out - use a drop of water - 12-15 times, 3 times a day  3. Swallow and keep Adam's Apple up for 5 seconds - use a drop of water  - 12-15  times, 3 times a day  4. Roll up a towel and keep it UNDER your chin for 45 seconds - 3 times, 3 times a day        5.  Super Swallow  - take a breath and hold it. Swallow and then cough hard             - 10-12 times, 3 times a day        6.   Hold a half teaspoon of water in your mouth for 5 seconds and swallow hard  - 12-15 times, 3 times a day         7.    Hold your tongue with gauze and pull it back 12-15 times, 3 times a day  ==================================================  SMALL BITES/SIPS SLOW RATE SWALLOW/CLEAR THROAT/SWALLOW SMALL, SEPARATE SIPS REMAIN UPRIGHT FOR 30 MINUTES AFTER MEALS/SNACKS

## 2019-07-27 ENCOUNTER — Ambulatory Visit: Payer: Medicare Other | Attending: Physician Assistant

## 2019-07-27 ENCOUNTER — Other Ambulatory Visit: Payer: Self-pay

## 2019-07-27 DIAGNOSIS — R1312 Dysphagia, oropharyngeal phase: Secondary | ICD-10-CM | POA: Diagnosis not present

## 2019-07-27 NOTE — Therapy (Signed)
[facility] [address] [place], [place], [address] Phone: [phone]   Fax:  [phone]  Speech Language Pathology Treatment  Patient Details  Name: Angela Nicholson MRN: [id number] Date of Birth: 1970/11/30 Referring Provider (SLP): Charolett Bumpers, PA-C   Encounter Date: 07/27/2019  End of Session - 07/27/19 1217    Visit Number  2    Number of Visits  17    Date for SLP Re-Evaluation  10/19/19    SLP Start Time  1020    SLP Stop Time   1101    SLP Time Calculation (min)  41 min    Activity Tolerance  Patient tolerated treatment well       Past Medical History:  Diagnosis Date  . Asthma   . Barrett esophagus   . Deaf   . GERD (gastroesophageal reflux disease)   . Schizo affective schizophrenia ([facility])     No past surgical history on file.  There were no vitals filed for this visit.  Subjective Assessment - 07/27/19 1206    Subjective  Pt shows SLP her 1/2 t measuring spoon for her oral hold exercise.    Patient is accompained by:  Interpreter;Family member    Currently in Pain?  No/denies            ADULT SLP TREATMENT - 07/27/19 1207      General Information   Behavior/Cognition  Alert;Cooperative;Pleasant mood      Treatment Provided   Treatment provided  Dysphagia      Dysphagia Treatment   Temperature Spikes Noted  No    Respiratory Status  Room air    Oral Cavity - Dentition  Adequate natural dentition    Treatment Methods  Therapeutic exercise;Patient/caregiver education;Skilled observation    Patient observed directly with PO's  Yes    Type of PO's observed  Nectar-thick liquids    Liquids provided via  Cup    Other treatment/comments  No overt s/s aspiration with sips or smaller bolus sizes of nectar today. Pt req'd min A with exercises other than Mendelsohn. Mendelsohn req'd max A including visual, verbal, tactile, and youtube video and pt cont to require cues - SLP suggested  pt feeling mother's thyroid cartilage to provide more info for pt via tactile feedback. Work on [place] took 25 minutes. SLP reiterated pts swallow precautions       Assessment / Recommendations / Plan   Plan  Continue with current plan of care      Dysphagia Recommendations   Diet recommendations  Dysphagia 3 (mechanical soft);Nectar-thick liquid    Liquids provided via  Cup;No straw    Supervision  Intermittent supervision to cue for compensatory strategies    Compensations  Slow rate;Small sips/bites;Clear throat after each swallow   single sips   Postural Changes and/or Swallow Maneuvers  Upright 30-60 min after meal;Seated upright 90 degrees      Progression Toward Goals   Progression toward goals  Progressing toward goals       SLP Education - 07/27/19 1217    Education Details  swallow precautions, HEP prcedure [place]air [place]ull[place])    Person(s) Educated  Patient;Parent(s)    Methods  Explanation;Demonstration;Verbal cues;Other (comment)   youtube   Comprehension  Verbalized understanding;Verbal cues required;Need further instruction       SLP Short Term Goals - 07/27/19 1219      SLP SHORT TERM GOAL #1   Title  pt will demo swallowing HEP with occasional mod A over  three sessions    Time  4    Period  Weeks    Status  On-going      SLP SHORT TERM GOAL #2   Title  pt will demo swallow precautions with POs during 3 sessions, with rare min-mod A    Time  4    Period  Weeks    Status  On-going       SLP Long Term Goals - 07/27/19 1219      SLP LONG TERM GOAL #1   Title  pt will complete HEP with occasional min-mod A over three sessions    Time  8    Period  Weeks   (or 17 sessions, for all LTGs)   Status  On-going      SLP LONG TERM GOAL #2   Title  pt will follow swallow precautions with rare min A in 3 sessions    Time  8    Period  Weeks    Status  On-going      SLP LONG TERM GOAL #3   Title  pt will tell SLP how often she is to complete HEP after her next  modified barium swallow exam, in 3 sessions    Time  8    Period  Weeks    Status  On-going       Plan - 07/27/19 1218    Clinical Impression Statement  Pt presents with new onset of decr'd safety with oropharyngeal swallowing c/b silent trace aspiration with thin liquids, premature spillage, and trace to mild vallecular an pyriform residues-worse with thicker POs. see "imaging" section for details from that 06-21-19 exam. Pt would benefit from skilled ST to target accuracy of HEP procedure and habitualizing close adherence to swallow precautions. Additionally, pt's mother would benefit from incr'ing her skills at monitoring and assisting pt with her performance.    Speech Therapy Frequency  2x / week    Duration  --   8 weeks or 17 visits   Treatment/Interventions  Aspiration precaution training;Pharyngeal strengthening exercises;Diet toleration management by SLP;Internal/external aids;Patient/family education;Compensatory strategies;Cueing hierarchy;SLP instruction and feedback;Trials of upgraded texture/liquids    Potential to Achieve Goals  Fair    Potential Considerations  Co-morbidities;Severity of impairments    SLP Home Exercise Plan  provided today    Consulted and Agree with Plan of Care  Patient       Patient will benefit from skilled therapeutic intervention in order to improve the following deficits and impairments:   Dysphagia, oropharyngeal phase    Problem List Patient Active Problem List   Diagnosis Date Noted  . Asthma with status asthmaticus 07/13/2013  . Hypothyroidism 07/13/2013  . Cerebral palsy ([place]) 07/13/2013  . Schizo affective schizophrenia ([place]) 07/13/2013  . 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 Anxiety 07/13/2013  . Status asthmaticus 07/13/2013    [facility] ,MS, CCC-SLP  07/27/2019, 12:19 PM  [facility] [address] [place], [place], [address] Phone: [phone]   Fax:  [phone]   Name: Kiera Hussey MRN: [id number] Date of Birth: 12-17-1970

## 2019-08-03 ENCOUNTER — Ambulatory Visit: Payer: Medicare Other

## 2019-08-03 ENCOUNTER — Other Ambulatory Visit: Payer: Self-pay

## 2019-08-03 DIAGNOSIS — R1312 Dysphagia, oropharyngeal phase: Secondary | ICD-10-CM | POA: Diagnosis not present

## 2019-08-03 NOTE — Therapy (Signed)
[facility] [address] [place], [place], [address] Phone: [phone]   Fax:  [phone]  Speech Language Pathology Treatment  Patient Details  Name: Angela Nicholson MRN: [id number] Date of Birth: February 17, 1971 Referring Provider (SLP): Elvin So, PA-C   Encounter Date: 08/03/2019  End of Session - 08/03/19 1147    Visit Number  3    Number of Visits  17    Date for SLP Re-Evaluation  10/19/19    SLP Start Time  0805    SLP Stop Time   0845    SLP Time Calculation (min)  40 min    Activity Tolerance  Patient tolerated treatment well       Past Medical History:  Diagnosis Date  . Asthma   . Barrett esophagus   . Deaf   . GERD (gastroesophageal reflux disease)   . Schizo affective schizophrenia ([place])     History reviewed. No pertinent surgical history.  There were no vitals filed for this visit.  Subjective Assessment - 08/03/19 0846    Subjective  Pt arrives with mom and sign interpreter.    Currently in Pain?  No/denies            ADULT SLP TREATMENT - 08/03/19 0846      General Information   Behavior/Cognition  Alert;Cooperative;Pleasant mood      Treatment Provided   Treatment provided  Dysphagia      Dysphagia Treatment   Temperature Spikes Noted  No    Respiratory Status  Room air    Oral Cavity - Dentition  Adequate natural dentition    Treatment Methods  Therapeutic exercise;Patient/caregiver education;Skilled observation    Patient observed directly with PO's  Yes    Type of PO's observed  Nectar-thick liquids    Liquids provided via  --   bottle   Oral Phase Signs & Symptoms  Other (comment)   none noted   Pharyngeal Phase Signs & Symptoms  --   none noted   Other treatment/comments  no overt s/s aspiration today with POs (4 sips H2O for HEP). Pt req'd min-mod A occasionally - max A with mendelsohn.       Assessment / Recommendations / Plan   Plan  Continue with  current plan of care      Dysphagia Recommendations   Diet recommendations  Dysphagia 3 (mechanical soft);Nectar-thick liquid    Liquids provided via  Cup;No straw    Supervision  Intermittent supervision to cue for compensatory strategies    Compensations  Slow rate;Small sips/bites;Clear throat after each swallow    Postural Changes and/or Swallow Maneuvers  Upright 30-60 min after meal;Seated upright 90 degrees      Progression Toward Goals   Progression toward goals  Progressing toward goals       SLP Education - 08/03/19 1139    Education Details  HEP (mendelsohn, gauze)    Person(s) Educated  Patient    Methods  Explanation;Demonstration;Verbal cues    Comprehension  Verbalized understanding;Verbal cues required;Need further instruction       SLP Short Term Goals - 08/03/19 1158      SLP SHORT TERM GOAL #1   Title  pt will demo swallowing HEP with occasional mod A over three sessions    Time  3    Period  Weeks    Status  On-going      SLP SHORT TERM GOAL #2   Title  pt will demo swallow precautions with  POs during 3 sessions, with rare min-mod A    Time  3    Period  Weeks    Status  On-going       SLP Long Term Goals - 08/03/19 1158      SLP LONG TERM GOAL #1   Title  pt will complete HEP with occasional min-mod A over three sessions    Time  7    Period  Weeks   (or 17 sessions, for all LTGs)   Status  On-going      SLP LONG TERM GOAL #2   Title  pt will follow swallow precautions with rare min A in 3 sessions    Time  7    Period  Weeks    Status  On-going      SLP LONG TERM GOAL #3   Title  pt will tell SLP how often she is to complete HEP after her next modified barium swallow exam, in 3 sessions    Time  7    Period  Weeks    Status  On-going       Plan - 08/03/19 1158    Clinical Impression Statement  Pt presents with new onset of decr'd safety with oropharyngeal swallowing c/b silent trace aspiration with thin liquids, premature spillage,  and trace to mild vallecular an pyriform residues-worse with thicker POs. see "imaging" section for details from that 06-21-19 exam. Pt would cont to benefit from skilled ST to target accuracy of HEP procedure and habitualizing close adherence to swallow precautions. Additionally, pt's mother would benefit from incr'ing her skills at monitoring and assisting pt with her performance.    Speech Therapy Frequency  2x / week    Duration  --   8 weeks or 17 visits   Treatment/Interventions  Aspiration precaution training;Pharyngeal strengthening exercises;Diet toleration management by SLP;Internal/external aids;Patient/family education;Compensatory strategies;Cueing hierarchy;SLP instruction and feedback;Trials of upgraded texture/liquids    Potential to Achieve Goals  Fair    Potential Considerations  Co-morbidities;Severity of impairments    SLP Home Exercise Plan  provided today    Consulted and Agree with Plan of Care  Patient       Patient will benefit from skilled therapeutic intervention in order to improve the following deficits and impairments:   Dysphagia, oropharyngeal phase    Problem List Patient Active Problem List   Diagnosis Date Noted  . Asthma with status asthmaticus 07/13/2013  . Hypothyroidism 07/13/2013  . Cerebral palsy (HCC) 07/13/2013  . Schizo affective schizophrenia (HCC) 07/13/2013  . 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 Anxiety 07/13/2013  . Status asthmaticus 07/13/2013    [facility] ,MS, CCC-SLP  08/03/2019, 11:59 AM  [facility] [address] [place], [place], [address] Phone: [phone]   Fax:  [phone]   Name: Angela Nicholson MRN: [id number] Date of Birth: 07/16/1971

## 2019-08-04 ENCOUNTER — Other Ambulatory Visit: Payer: Self-pay

## 2019-08-04 ENCOUNTER — Ambulatory Visit: Payer: Medicare Other

## 2019-08-04 DIAGNOSIS — R1312 Dysphagia, oropharyngeal phase: Secondary | ICD-10-CM

## 2019-08-04 NOTE — Therapy (Signed)
[facility] [address] [place], [place], [address] Phone: [phone]   Fax:  [phone]  Speech Language Pathology Treatment  Patient Details  Name: Angela Nicholson MRN: [id number] Date of Birth: 1970-09-12 Referring Provider (SLP): Elvin So, PA-C   Encounter Date: 08/04/2019  End of Session - 08/04/19 1349    Visit Number  4    Number of Visits  17    Date for SLP Re-Evaluation  10/19/19    SLP Start Time  0935    SLP Stop Time   4818    SLP Time Calculation (min)  40 min    Activity Tolerance  Patient tolerated treatment well       Past Medical History:  Diagnosis Date  . Asthma   . Barrett esophagus   . Deaf   . GERD (gastroesophageal reflux disease)   . Schizo affective schizophrenia ([place])     History reviewed. No pertinent surgical history.  There were no vitals filed for this visit.  Subjective Assessment - 08/04/19 1017    Subjective  Pt arrives with mom and sign interpreter.            ADULT SLP TREATMENT - 08/04/19 1017      General Information   Behavior/Cognition  Alert;Cooperative;Pleasant mood      Treatment Provided   Treatment provided  Dysphagia      Dysphagia Treatment   Temperature Spikes Noted  No    Respiratory Status  Room air    Oral Cavity - Dentition  Adequate natural dentition    Treatment Methods  Compensation strategy training;Patient/caregiver education;Skilled observation;Therapeutic exercise    Patient observed directly with PO's  Yes    Type of PO's observed  Dysphagia 3 (soft);Nectar-thick liquids    Liquids provided via  Cup    Oral Phase Signs & Symptoms  --   none noted   Pharyngeal Phase Signs & Symptoms  --   none noted   Other treatment/comments  MAjority of session spent with pt trying multiple ways to correctly achieve [facility] including tactile, verbal, visual and drawing cues.  After all of these max cues, pt still unable to perform  correctly. SLP has attempted multiple times with multiple means to have pt be successful with this exercise but nothing has been successful to date. Pt even with max difficulty during her last plan of care with this exercise. SLP told pt/mother to omit from her HEP. SLP observed pt with POs to assess following of precautions; mother stated pt is not doing these consistently at home. SLP strongly reiterated with visual cues that pt must perform these precautions when eating or she may aspirate. Pt indicated understanding but likely needs reinformcement. SLP told mother to check in on pt during POs when she's present in the home an pt is eating, and SLP told pt to expect mom to check in on her. Pt acknowledged understanidng.       Pain Assessment   Pain Assessment  No/denies pain      Assessment / Recommendations / Plan   Plan  Continue with current plan of care   omit Mendelsohn from HEP     Dysphagia Recommendations   Diet recommendations  Dysphagia 3 (mechanical soft);Nectar-thick liquid    Liquids provided via  Cup;No straw    Supervision  Intermittent supervision to cue for compensatory strategies    Compensations  Slow rate;Small sips/bites;Clear throat after each swallow    Postural Changes and/or Swallow Maneuvers  Upright 30-60 min after meal;Seated upright 90 degrees      Progression Toward Goals   Progression toward goals  Progressing toward goals       SLP Education - 08/04/19 1349    Education Details  HEP procedure Clair Gulling), swallow precautions and ramifications of not following them    Person(s) Educated  Patient;Parent(s)    Methods  Explanation;Verbal cues;Demonstration;Tactile cues    Comprehension  Verbalized understanding;Need further instruction;Returned demonstration       SLP Short Term Goals - 08/04/19 1351      SLP SHORT TERM GOAL #1   Title  pt will demo swallowing HEP with occasional mod A over three sessions    Baseline  08-04-19    Time  3    Period   Weeks    Status  On-going      SLP SHORT TERM GOAL #2   Title  pt will demo swallow precautions with POs during 3 sessions, with rare min-mod A    Time  3    Period  Weeks    Status  On-going       SLP Long Term Goals - 08/04/19 1351      SLP LONG TERM GOAL #1   Title  pt will complete HEP with occasional min-mod A over three sessions    Baseline  08-04-19    Time  7    Period  Weeks   (or 17 sessions, for all LTGs)   Status  On-going      SLP LONG TERM GOAL #2   Title  pt will follow swallow precautions with rare min A in 3 sessions    Time  7    Period  Weeks    Status  On-going      SLP LONG TERM GOAL #3   Title  pt will tell SLP how often she is to complete HEP after her next modified barium swallow exam, in 3 sessions    Time  7    Period  Weeks    Status  On-going       Plan - 08/04/19 1350    Clinical Impression Statement  Pt presents with new onset of decr'd safety with oropharyngeal swallowing c/b silent trace aspiration with thin liquids, premature spillage, and trace to mild vallecular an pyriform residues-worse with thicker POs. see "imaging" section for details from that 06-21-19 exam. Pt would cont to benefit from skilled ST to target accuracy of HEP procedure and habitualizing close adherence to swallow precautions. Additionally, pt's mother would benefit from incr'ing her skills at monitoring and assisting pt with her performance.    Speech Therapy Frequency  2x / week    Duration  --   8 weeks or 17 visits   Treatment/Interventions  Aspiration precaution training;Pharyngeal strengthening exercises;Diet toleration management by SLP;Internal/external aids;Patient/family education;Compensatory strategies;Cueing hierarchy;SLP instruction and feedback;Trials of upgraded texture/liquids    Potential to Achieve Goals  Fair    Potential Considerations  Co-morbidities;Severity of impairments    SLP Home Exercise Plan  provided today    Consulted and Agree with Plan  of Care  Patient       Patient will benefit from skilled therapeutic intervention in order to improve the following deficits and impairments:   Dysphagia, oropharyngeal phase    Problem List Patient Active Problem List   Diagnosis Date Noted  . Asthma with status asthmaticus 07/13/2013  . Hypothyroidism 07/13/2013  . Cerebral palsy (HCC) 07/13/2013  . Schizo affective schizophrenia (HCC)  07/13/2013  . 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 Anxiety 07/13/2013  . Status asthmaticus 07/13/2013    [facility] ,MS, CCC-SLP  08/04/2019, 1:52 PM  [facility] [address] [place], [place], [address] Phone: [phone]   Fax:  [phone]   Name: Angela Nicholson MRN: [id number] Date of Birth: 1971-01-01

## 2019-08-04 NOTE — Patient Instructions (Signed)
   Please omit #3 from your exercises.

## 2019-08-11 ENCOUNTER — Ambulatory Visit: Payer: Medicare Other | Admitting: Speech Pathology

## 2019-08-12 ENCOUNTER — Ambulatory Visit: Payer: Medicare Other

## 2019-08-12 ENCOUNTER — Other Ambulatory Visit: Payer: Self-pay

## 2019-08-12 DIAGNOSIS — R1312 Dysphagia, oropharyngeal phase: Secondary | ICD-10-CM | POA: Diagnosis not present

## 2019-08-12 NOTE — Therapy (Signed)
[facility] [address] [place], [place], [address] Phone: [phone]   Fax:  [phone]  Speech Language Pathology Treatment  Patient Details  Name: Angela Nicholson MRN: [id number] Date of Birth: April 09, 1971 Referring Provider (SLP): Elvin So, PA-C   Encounter Date: 08/12/2019  End of Session - 08/12/19 1555    Visit Number  5    Number of Visits  17    Date for SLP Re-Evaluation  10/19/19    SLP Start Time  1450    SLP Stop Time   1522    SLP Time Calculation (min)  32 min    Activity Tolerance  Patient tolerated treatment well       Past Medical History:  Diagnosis Date  . Asthma   . Barrett esophagus   . Deaf   . GERD (gastroesophageal reflux disease)   . Schizo affective schizophrenia ([place])     No past surgical history on file.  There were no vitals filed for this visit.  Subjective Assessment - 08/12/19 1549    Subjective  Pt arrives today without POs. SLP asked pt mother to bring POs next time.    Patient is accompained by:  Interpreter;Family member   mom   Currently in Pain?  No/denies            ADULT SLP TREATMENT - 08/12/19 1550      General Information   Behavior/Cognition  Alert;Cooperative;Pleasant mood      Treatment Provided   Treatment provided  Dysphagia      Dysphagia Treatment   Temperature Spikes Noted  No    Respiratory Status  Room air    Oral Cavity - Dentition  Adequate natural dentition    Treatment Methods  Skilled observation;Patient/caregiver education;Therapeutic exercise;Compensation strategy training    Patient observed directly with PO's  Yes    Type of PO's observed  Nectar-thick liquids    Liquids provided via  Cup    Oral Phase Signs & Symptoms  --   none noted   Pharyngeal Phase Signs & Symptoms  --   none noted   Other treatment/comments  Mother states frequency of coughing has decr'd compared to prior to latest course of ST. SLP  reminded pt she would need to cont HEP even after d/c. Pt req'd only rare min A for [place] - how to use a drop or two of water to enhance exercise and make easier to obtain swallow. Pt cough for supraglottic was stronger. SLP addressed some specific food items pt coughs more with - rice and graham crackers. SLP suggested rice with gravy and graham crackers with milk, and explained why to mother and pt.       Assessment / Recommendations / Plan   Plan  Continue with current plan of care   if success continues pt may be appopriate for x1/week     Dysphagia Recommendations   Diet recommendations  Dysphagia 3 (mechanical soft);Nectar-thick liquid    Liquids provided via  Cup;No straw    Supervision  Intermittent supervision to cue for compensatory strategies    Compensations  Slow rate;Small sips/bites;Clear throat after each swallow    Postural Changes and/or Swallow Maneuvers  Upright 30-60 min after meal;Seated upright 90 degrees      Progression Toward Goals   Progression toward goals  Progressing toward goals       SLP Education - 08/12/19 1554    Education Details  modifictions for rice and graham crackers, need  to perform HEP after d/c from ST    Person(s) Educated  Patient;Parent(s)    Methods  Explanation;Verbal cues;Demonstration    Comprehension  Verbalized understanding;Returned demonstration;Need further instruction       SLP Short Term Goals - 08/12/19 1557      SLP SHORT TERM GOAL #1   Title  pt will demo swallowing HEP with occasional mod A over three sessions    Baseline  08-04-19, 08-12-19    Time  2    Period  Weeks    Status  On-going      SLP SHORT TERM GOAL #2   Title  pt will demo swallow precautions with POs during 3 sessions, with rare min-mod A    Time  2    Period  Weeks    Status  On-going       SLP Long Term Goals - 08/12/19 1557      SLP LONG TERM GOAL #1   Title  pt will complete HEP with occasional min-mod A over three sessions    Baseline   08-04-19, 08-12-19    Time  6    Period  Weeks   (or 17 sessions, for all LTGs)   Status  On-going      SLP LONG TERM GOAL #2   Title  pt will follow swallow precautions with rare min A in 3 sessions    Time  6    Period  Weeks    Status  On-going      SLP LONG TERM GOAL #3   Title  pt will tell SLP how often she is to complete HEP after her next modified barium swallow exam, in 3 sessions    Time  6    Period  Weeks    Status  On-going       Plan - 08/12/19 1556    Clinical Impression Statement  Pt presents with decr'd safety with oropharyngeal swallowing c/b silent trace aspiration with thin liquids, premature spillage, and trace to mild vallecular an pyriform residues-worse with thicker POs. See "other comments" for details re: today's session. SLP believes pt accuracy with HEP is improving; SLP asked pt mother to bring POs next session so SLP can see pt with POs to assess use of aspiration precautions. Pt would cont to benefit from skilled ST to target accuracy of HEP procedure and habitualizing close adherence to swallow precautions. Additionally, pt's mother would benefit from incr'ing her skills at monitoring and assisting pt with her performance.    Speech Therapy Frequency  2x / week    Duration  --   8 weeks or 17 visits   Treatment/Interventions  Aspiration precaution training;Pharyngeal strengthening exercises;Diet toleration management by SLP;Internal/external aids;Patient/family education;Compensatory strategies;Cueing hierarchy;SLP instruction and feedback;Trials of upgraded texture/liquids    Potential to Achieve Goals  Fair    Potential Considerations  Co-morbidities;Severity of impairments    SLP Home Exercise Plan  provided today    Consulted and Agree with Plan of Care  Patient       Patient will benefit from skilled therapeutic intervention in order to improve the following deficits and impairments:   Dysphagia, oropharyngeal phase    Problem List Patient  Active Problem List   Diagnosis Date Noted  . Asthma with status asthmaticus 07/13/2013  . Hypothyroidism 07/13/2013  . Cerebral palsy (HCC) 07/13/2013  . Schizo affective schizophrenia (HCC) 07/13/2013  . 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 Anxiety 07/13/2013  . Status asthmaticus 07/13/2013    [facility] ,MS, CCC-SLP  08/12/2019, 3:58 PM  [facility] [address] [place], [place], [address] Phone: [phone]   Fax:  [phone]   Name: Angela Nicholson MRN: [id number] Date of Birth: 12-Jul-1971

## 2019-08-16 ENCOUNTER — Ambulatory Visit: Payer: Medicare Other

## 2019-08-16 ENCOUNTER — Other Ambulatory Visit: Payer: Self-pay

## 2019-08-16 DIAGNOSIS — R1312 Dysphagia, oropharyngeal phase: Secondary | ICD-10-CM | POA: Diagnosis not present

## 2019-08-16 NOTE — Therapy (Signed)
[facility] [address] [place], [place], [address] Phone: [phone]   Fax:  [phone]  Speech Language Pathology Treatment  Patient Details  Name: Angela Nicholson MRN: [id number] Date of Birth: 1971-06-18 Referring Provider (SLP): Elvin So, PA-C   Encounter Date: 08/16/2019  End of Session - 08/16/19 1107    Visit Number  6    Number of Visits  17    Date for SLP Re-Evaluation  10/19/19    SLP Start Time  77    SLP Stop Time   1053    SLP Time Calculation (min)  33 min    Activity Tolerance  Patient tolerated treatment well       Past Medical History:  Diagnosis Date  . Asthma   . Barrett esophagus   . Deaf   . GERD (gastroesophageal reflux disease)   . Schizo affective schizophrenia ([place])     History reviewed. No pertinent surgical history.  There were no vitals filed for this visit.  Subjective Assessment - 08/16/19 1058    Subjective  Pt arrives today with POs.    Patient is accompained by:  Interpreter;Family member            ADULT SLP TREATMENT - 08/16/19 1059      General Information   Behavior/Cognition  Alert;Cooperative;Pleasant mood      Treatment Provided   Treatment provided  Dysphagia      Dysphagia Treatment   Temperature Spikes Noted  No    Respiratory Status  Room air    Oral Cavity - Dentition  Adequate natural dentition    Treatment Methods  Skilled observation;Patient/caregiver education;Compensation strategy training;Therapeutic exercise    Patient observed directly with PO's  Yes    Type of PO's observed  Dysphagia 3 (soft);Nectar-thick liquids    Feeding  Able to feed self    Liquids provided via  --   bottle   Oral Phase Signs & Symptoms  --   none noted   Pharyngeal Phase Signs & Symptoms  --   none noted - pt is SILENT aspirator however   Other treatment/comments  Mother states pt coughed minimally (as opposed to rarely-occasionally) with  "sticky" rice than with the regular way she prepares it. SLP encouraged mother to cont with "sticky" consistency - mother expressed gratefullness to SLP for this suggestion. Pt performed HEP (minus Mendelsohn) with SBA - x2-4 reps of each exercise. With POs, SLP req'd to provide initial mod cues for double swallow, clear throat, swallow but pt independent last 5 minutes of PO ingestion. Pt to bring graham cracker and poptart with some milk next session as she enoys graham crackers. Mother stated pt coughs with graham crackers so SLP suggested enjoing them with milk during last ST session- pt stated it "burns" with milk, so SLP to observe pt with graham cracker and milk next session.      Assessment / Recommendations / Plan   Plan  Continue with current plan of care   consider x1/week next session     Progression Toward Goals   Progression toward goals  Progressing toward goals       SLP Education - 08/16/19 1107    Education Details  aspiration precautions    Person(s) Educated  Patient;Parent(s)    Methods  Explanation;Demonstration;Verbal cues    Comprehension  Verbalized understanding;Returned demonstration;Verbal cues required;Need further instruction       SLP Short Term Goals - 08/16/19 1109  SLP SHORT TERM GOAL #1   Title  pt will demo swallowing HEP with occasional mod A over three sessions    Baseline  08-04-19, 08-12-19, 08-16-19    Status  Achieved      SLP SHORT TERM GOAL #2   Title  pt will demo swallow precautions with POs during 3 sessions, with rare min-mod A    Baseline  08-16-19    Time  1    Period  Weeks    Status  On-going       SLP Long Term Goals - 08/16/19 1109      SLP LONG TERM GOAL #1   Title  pt will complete HEP with occasional min-mod A over three sessions    Baseline  08-04-19, 08-12-19, 08-16-19    Time  5    Period  Weeks   (or 17 sessions, for all LTGs)   Status  On-going      SLP LONG TERM GOAL #2   Title  pt will follow swallow  precautions with rare min A in 3 sessions    Baseline  08-16-19    Time  5    Period  Weeks    Status  On-going      SLP LONG TERM GOAL #3   Title  pt will tell SLP how often she is to complete HEP after her next modified barium swallow exam, in 3 sessions    Time  5    Period  Weeks    Status  On-going       Plan - 08/16/19 1108    Clinical Impression Statement  Pt presents with decr'd safety with oropharyngeal swallowing c/b silent trace aspiration with thin liquids, premature spillage, and trace to mild vallecular an pyriform residues-worse with thicker POs. Pt with good -excellent procedure with HEP. See "other comments" for details re: today's session. Considering reducing to once/week after next session due to success. Pt would cont to benefit from skilled ST to target accuracy of HEP procedure and habitualizing close adherence to swallow precautions. Additionally, pt's mother would benefit from incr'ing her skills at monitoring and assisting pt with her performance.    Speech Therapy Frequency  2x / week    Duration  --   8 weeks or 17 visits   Treatment/Interventions  Aspiration precaution training;Pharyngeal strengthening exercises;Diet toleration management by SLP;Internal/external aids;Patient/family education;Compensatory strategies;Cueing hierarchy;SLP instruction and feedback;Trials of upgraded texture/liquids    Potential to Achieve Goals  Fair    Potential Considerations  Co-morbidities;Severity of impairments    SLP Home Exercise Plan  provided today    Consulted and Agree with Plan of Care  Patient       Patient will benefit from skilled therapeutic intervention in order to improve the following deficits and impairments:   Dysphagia, oropharyngeal phase    Problem List Patient Active Problem List   Diagnosis Date Noted  . Asthma with status asthmaticus 07/13/2013  . Hypothyroidism 07/13/2013  . Cerebral palsy (HCC) 07/13/2013  . Schizo affective schizophrenia  (HCC) 07/13/2013  . 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 Anxiety 07/13/2013  . Status asthmaticus 07/13/2013    [facility] ,MS, CCC-SLP  08/16/2019, 11:10 AM  [facility] [address] [place], [place], [address] Phone: [phone]   Fax:  [phone]   Name: Angela Nicholson MRN: [id number] Date of Birth: October 21, 1970

## 2019-08-18 ENCOUNTER — Ambulatory Visit: Payer: Medicare Other

## 2019-08-18 ENCOUNTER — Other Ambulatory Visit: Payer: Self-pay

## 2019-08-18 DIAGNOSIS — R1312 Dysphagia, oropharyngeal phase: Secondary | ICD-10-CM | POA: Diagnosis not present

## 2019-08-18 NOTE — Therapy (Signed)
[facility] [address] [place], [place], [address] Phone: [phone]   Fax:  [phone]  Speech Language Pathology Treatment  Patient Details  Name: Angela Nicholson MRN: [id number] Date of Birth: 1971/01/06 Referring Provider (SLP): Elvin So, PA-C   Encounter Date: 08/18/2019  End of Session - 08/18/19 1224    Visit Number  7    Number of Visits  17    Date for SLP Re-Evaluation  10/19/19    SLP Start Time  0932    SLP Stop Time   1012    SLP Time Calculation (min)  40 min    Activity Tolerance  Patient tolerated treatment well       Past Medical History:  Diagnosis Date  . Asthma   . Barrett esophagus   . Deaf   . GERD (gastroesophageal reflux disease)   . Schizo affective schizophrenia ([place])     No past surgical history on file.  There were no vitals filed for this visit.  Subjective Assessment - 08/18/19 1217    Subjective  Pt arrives today with POs.    Patient is accompained by:  Interpreter;Family member            ADULT SLP TREATMENT - 08/18/19 1217      General Information   Behavior/Cognition  Alert;Cooperative;Pleasant mood      Treatment Provided   Treatment provided  Dysphagia      Dysphagia Treatment   Temperature Spikes Noted  No    Respiratory Status  Room air    Oral Cavity - Dentition  Adequate natural dentition    Treatment Methods  Skilled observation;Patient/caregiver education;Compensation strategy training;Therapeutic exercise    Patient observed directly with PO's  Yes    Type of PO's observed  Dysphagia 3 (soft);Regular;Nectar-thick liquids   graham cracker, pop tart   Feeding  Able to feed self    Liquids provided via  Cup    Oral Phase Signs & Symptoms  --   none noted   Pharyngeal Phase Signs & Symptoms  --   none noted   Other treatment/comments  With small bites of graham cracker in milk, pt without overt s/sx aspiration (pt has hx of silent  aspiration, however) - mother states pt occasionally coughs when she takes graham cracker and pop tart to her room. SLP suspects pt not strictly adhering to precautinos when in her room. SLP reinforced pt ALWAYS has to follow swallow precautions in order to keep food and liquid out of her lungs. Today she followed the precautions independently. SLP had pt try graham crackers in milk in order to decr dryness of the snack. HEP was completed independently.       Assessment / Recommendations / Plan   Plan  --   pt to cont at once/week due to progress     Dysphagia Recommendations   Diet recommendations  Dysphagia 3 (mechanical soft);Nectar-thick liquid    Liquids provided via  Cup;No straw    Supervision  Intermittent supervision to cue for compensatory strategies    Compensations  Slow rate;Small sips/bites;Clear throat after each swallow    Postural Changes and/or Swallow Maneuvers  Upright 30-60 min after meal;Seated upright 90 degrees      Progression Toward Goals   Progression toward goals  Progressing toward goals         SLP Short Term Goals - 08/18/19 1226      SLP SHORT TERM GOAL #1   Title  pt  will demo swallowing HEP with occasional mod A over three sessions    Baseline  08-04-19, 08-12-19, 08-16-19    Status  Achieved      SLP SHORT TERM GOAL #2   Title  pt will demo swallow precautions with POs during 3 sessions, with rare min-mod A    Baseline  08-16-19, 08-18-19    Status  Partially Met       SLP Long Term Goals - 08/18/19 1226      SLP LONG TERM GOAL #1   Title  pt will complete HEP with occasional min-mod A over three sessions    Baseline  08-04-19, 08-12-19, 08-16-19    Period  --   (or 17 sessions, for all LTGs)   Status  Achieved      SLP LONG TERM GOAL #2   Title  pt will follow swallow precautions with rare min A in 3 sessions    Baseline  08-16-19, 08-18-19    Time  5    Period  Weeks    Status  On-going      SLP LONG TERM GOAL #3   Title  pt will tell SLP  how often she is to complete HEP after her next modified barium swallow exam, in 3 sessions    Time  5    Period  Weeks    Status  On-going      SLP LONG TERM GOAL #4   Title  pt will be independent with HEP in 2 sessions    Baseline  08-18-19    Time  5    Period  Weeks    Status  New       Plan - 08/18/19 1225    Clinical Impression Statement  Follow up MBSS scheduled 09-13-19. Pt presents with decr'd safety with oropharyngeal swallowing c/b silent trace aspiration with thin liquids, premature spillage, and trace to mild vallecular an pyriform residues-worse with thicker POs. Pt was indpendent today with HEP. See "other comments" for details re: today's session. Pt agrees to reducing to once/week due to success. Pt would cont to benefit from skilled ST to target accuracy of HEP procedure and habitualizing close adherence to swallow precautions. Additionally, pt's mother would benefit from incr'ing her skills at monitoring and assisting pt with her performance.    Speech Therapy Frequency  1x /week    Duration  --   8 weeks or 17 visits   Treatment/Interventions  Aspiration precaution training;Pharyngeal strengthening exercises;Diet toleration management by SLP;Internal/external aids;Patient/family education;Compensatory strategies;Cueing hierarchy;SLP instruction and feedback;Trials of upgraded texture/liquids    Potential to Achieve Goals  Fair    Potential Considerations  Co-morbidities;Severity of impairments    SLP Home Exercise Plan  provided today    Consulted and Agree with Plan of Care  Patient       Patient will benefit from skilled therapeutic intervention in order to improve the following deficits and impairments:   Dysphagia, oropharyngeal phase    Problem List Patient Active Problem List   Diagnosis Date Noted  . Asthma with status asthmaticus 07/13/2013  . Hypothyroidism 07/13/2013  . Cerebral palsy ([place]) 07/13/2013  . Schizo affective schizophrenia ([place])  07/13/2013  . 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 Anxiety 07/13/2013  . Status asthmaticus 07/13/2013    [facility] ,MS, CCC-SLP  08/18/2019, 12:27 PM  [facility] [address] [place], [place],  [address] Phone: [phone]   Fax:  [phone]   Name: Angela Nicholson MRN: [id number] Date of Birth: 10/15/1970

## 2019-08-23 ENCOUNTER — Ambulatory Visit: Payer: Medicare Other | Attending: Physician Assistant

## 2019-08-23 ENCOUNTER — Other Ambulatory Visit: Payer: Self-pay

## 2019-08-23 DIAGNOSIS — R1312 Dysphagia, oropharyngeal phase: Secondary | ICD-10-CM | POA: Diagnosis not present

## 2019-08-23 NOTE — Therapy (Signed)
[facility] [address] [place], [place], [address] Phone: [phone]   Fax:  [phone]  Speech Language Pathology Treatment  Patient Details  Name: Jaquala Fuller MRN: [id number] Date of Birth: January 12, 1971 Referring Provider (SLP): Elvin So, PA-C   Encounter Date: 08/23/2019  End of Session - 08/23/19 1727    Visit Number  8    Number of Visits  17    Date for SLP Re-Evaluation  10/19/19    SLP Start Time  3532    SLP Stop Time   9924    SLP Time Calculation (min)  35 min       Past Medical History:  Diagnosis Date  . Asthma   . Barrett esophagus   . Deaf   . GERD (gastroesophageal reflux disease)   . Schizo affective schizophrenia ([place])     No past surgical history on file.  There were no vitals filed for this visit.         ADULT SLP TREATMENT - 08/23/19 1718      General Information   Behavior/Cognition  Alert;Cooperative;Pleasant mood      Treatment Provided   Treatment provided  Dysphagia      Dysphagia Treatment   Temperature Spikes Noted  No    Respiratory Status  Room air    Oral Cavity - Dentition  Adequate natural dentition    Treatment Methods  Skilled observation;Patient/caregiver education;Therapeutic exercise    Patient observed directly with PO's  Yes    Type of PO's observed  Dysphagia 3 (soft);Nectar-thick liquids    Feeding  Able to feed self    Liquids provided via  Cup    Oral Phase Signs & Symptoms  --   none noted   Pharyngeal Phase Signs & Symptoms  --   none noted   Other treatment/comments  Pt completed HEP with occasional mod A faded to modified independent on more challenging gauze exercise, otherwise modified indpendent.  No overt s/sx aspiration with grilled ham and cheese and nectar.      Assessment / Recommendations / Plan   Plan  Continue with current plan of care      Dysphagia Recommendations   Diet recommendations  Dysphagia 3 (mechanical  soft);Nectar-thick liquid    Liquids provided via  Cup;No straw    Supervision  Intermittent supervision to cue for compensatory strategies    Compensations  Slow rate;Small sips/bites;Clear throat after each swallow    Postural Changes and/or Swallow Maneuvers  Upright 30-60 min after meal;Seated upright 90 degrees      Progression Toward Goals   Progression toward goals  Progressing toward goals       SLP Education - 08/23/19 1727    Education Details  more challenging gauze exercise    Person(s) Educated  Patient;Parent(s)    Methods  Explanation;Demonstration;Verbal cues    Comprehension  Verbalized understanding;Returned demonstration;Verbal cues required       SLP Short Term Goals - 08/18/19 1226      SLP SHORT TERM GOAL #1   Title  pt will demo swallowing HEP with occasional mod A over three sessions    Baseline  08-04-19, 08-12-19, 08-16-19    Status  Achieved      SLP SHORT TERM GOAL #2   Title  pt will demo swallow precautions with POs during 3 sessions, with rare min-mod A    Baseline  08-16-19, 08-18-19    Status  Partially Met  SLP Long Term Goals - 08/23/19 1729      SLP LONG TERM GOAL #1   Title  pt will complete HEP with occasional min-mod A over three sessions    Baseline  08-04-19, 08-12-19, 08-16-19    Period  --   (or 17 sessions, for all LTGs)   Status  Achieved      SLP LONG TERM GOAL #2   Title  pt will follow swallow precautions with rare min A in 3 sessions    Baseline  08-16-19, 08-18-19    Status  Achieved      SLP LONG TERM GOAL #3   Title  pt will tell SLP how often she is to complete HEP after her next modified barium swallow exam, in 3 sessions    Time  4    Period  Weeks    Status  On-going      SLP LONG TERM GOAL #4   Title  pt will be independent with HEP in 2 sessions    Baseline  08-18-19    Time  4    Period  Weeks    Status  On-going       Plan - 08/23/19 1728    Clinical Impression Statement  Follow up MBSS scheduled  09-13-19. Pt presents with decr'd safety with oropharyngeal swallowing c/b silent trace aspiration with thin liquids, premature spillage, and trace to mild vallecular an pyriform residues-worse with thicker POs. Perforance with HEP is much better. See "other comments" for details re: today's session. She remains without overt s/s aspiration PNA. Pt would cont to benefit from skilled ST to target accuracy of HEP procedure and habitualizing close adherence to swallow precautions. Additionally, pt's mother would benefit from incr'ing her skills at monitoring and assisting pt with her performance.    Speech Therapy Frequency  1x /week    Duration  --   8 weeks or 17 visits   Treatment/Interventions  Aspiration precaution training;Pharyngeal strengthening exercises;Diet toleration management by SLP;Internal/external aids;Patient/family education;Compensatory strategies;Cueing hierarchy;SLP instruction and feedback;Trials of upgraded texture/liquids    Potential to Achieve Goals  Fair    Potential Considerations  Co-morbidities;Severity of impairments    SLP Home Exercise Plan  provided today    Consulted and Agree with Plan of Care  Patient       Patient will benefit from skilled therapeutic intervention in order to improve the following deficits and impairments:   Dysphagia, oropharyngeal phase    Problem List Patient Active Problem List   Diagnosis Date Noted  . Asthma with status asthmaticus 07/13/2013  . Hypothyroidism 07/13/2013  . Cerebral palsy (HCC) 07/13/2013  . Schizo affective schizophrenia (HCC) 07/13/2013  . 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 Anxiety 07/13/2013  . Status asthmaticus 07/13/2013    , ,MS, CCC-SLP  08/23/2019, 5:30 PM  [facility] [address] [place], [place], [address] Phone: [phone]   Fax:   [phone]   Name: Janeece Rayborn MRN: [id number] Date of Birth: 04/03/1971 

## 2019-09-06 ENCOUNTER — Other Ambulatory Visit: Payer: Self-pay

## 2019-09-06 ENCOUNTER — Ambulatory Visit: Payer: Medicare Other

## 2019-09-06 DIAGNOSIS — R1312 Dysphagia, oropharyngeal phase: Secondary | ICD-10-CM

## 2019-09-06 NOTE — Therapy (Signed)
[facility] [address] [place], [place], [address] Phone: [phone]   Fax:  [phone]  Speech Language Pathology Treatment  Patient Details  Name: Angela Nicholson MRN: [id number] Date of Birth: 1970-12-17 Referring Provider (SLP): Elvin So, PA-C   Encounter Date: 09/06/2019  End of Session - 09/06/19 1618    Visit Number  9    Number of Visits  17    Date for SLP Re-Evaluation  10/19/19    SLP Start Time  1103    SLP Stop Time   1148    SLP Time Calculation (min)  45 min    Activity Tolerance  Patient tolerated treatment well       Past Medical History:  Diagnosis Date  . Asthma   . Barrett esophagus   . Deaf   . GERD (gastroesophageal reflux disease)   . Schizo affective schizophrenia ([place])     No past surgical history on file.  There were no vitals filed for this visit.  Subjective Assessment - 09/06/19 1122    Subjective  Sister arrives with pt today, and interpreter.    Patient is accompained by:  Interpreter;Family member   sister   Currently in Pain?  No/denies            ADULT SLP TREATMENT - 09/06/19 1122      General Information   Behavior/Cognition  Alert;Cooperative;Pleasant mood      Treatment Provided   Treatment provided  Dysphagia      Dysphagia Treatment   Temperature Spikes Noted  No    Respiratory Status  Room air    Oral Cavity - Dentition  Adequate natural dentition    Treatment Methods  Skilled observation;Therapeutic exercise;Compensation strategy training;Patient/caregiver education    Patient observed directly with PO's  Yes    Type of PO's observed  Regular;Thin liquids    Feeding  Able to feed self    Liquids provided via  Cup    Oral Phase Signs & Symptoms  --   none noted   Pharyngeal Phase Signs & Symptoms  Immediate cough;Delayed cough;Other (comment)   coughed a 1/3 inch unchewed pretzel piece   Other treatment/comments  Pt completed  effortful swallow, [place] without cues. Pt brought mini-pretzel sticks with milk to soften. Sister stated pt constantly coughs when she is upstairs in her room eating and ignores mother's assistance when mother checks on pt and attempts to correct pt. Today pt followed swallow precautions with SBA, however had one significant coughing episode which lasted 60 seconds and pt coughed up 1/3 inch pretzel stick unchewed. Pt remarked "I need to chew chew chew." SLP discussed with pt and sister pretzels with a LITTLE cream cheese, or dipped in sour cream would be best. SLP reiterated multiple times that pt must slow down when she eats and this is likely what is happening in her room when sister states she is coughing regularly. SLP reviewed again why pt is coughing and the danger of continued aspiration. Pt acknowledged understanding.      Assessment / Recommendations / Plan   Plan  Continue with current plan of care      Dysphagia Recommendations   Diet recommendations  Dysphagia 3 (mechanical soft);Nectar-thick liquid   with precautions for snack foods (to soften)   Liquids provided via  Cup;No straw    Supervision  Intermittent supervision to cue for compensatory strategies    Compensations  Slow rate;Small sips/bites;Clear throat after each swallow  Postural Changes and/or Swallow Maneuvers  Upright 30-60 min after meal;Seated upright 90 degrees      Progression Toward Goals   Progression toward goals  Progressing toward goals       SLP Education - 09/06/19 1618    Education Details  rationale for precautions, aspiration PNA and it's cause, pt needs to SLOW pace and CHEW thoroughly    Person(s) Educated  Patient;Caregiver(s)    Methods  Explanation;Demonstration    Comprehension  Verbalized understanding;Need further instruction       SLP Short Term Goals - 08/18/19 1226      [place] #1   Title  pt will demo swallowing HEP with occasional mod A over three sessions    Baseline   08-04-19, 08-12-19, 08-16-19    Status  Achieved      SLP SHORT TERM GOAL #2   Title  pt will demo swallow precautions with POs during 3 sessions, with rare min-mod A    Baseline  08-16-19, 08-18-19    Status  Partially Met       SLP Long Term Goals - 09/06/19 1619      SLP LONG TERM GOAL #1   Title  pt will complete HEP with occasional min-mod A over three sessions    Baseline  08-04-19, 08-12-19, 08-16-19    Period  --   (or 17 sessions, for all LTGs)   Status  Achieved      SLP LONG TERM GOAL #2   Title  pt will follow swallow precautions with rare min A in 3 sessions    Baseline  08-16-19, 08-18-19    Status  Achieved      SLP LONG TERM GOAL #3   Title  pt will tell SLP how often she is to complete HEP after her next modified barium swallow exam, in 3 sessions    Time  3    Period  Weeks    Status  On-going      SLP LONG TERM GOAL #4   Title  pt will be independent with HEP in 2 sessions    Baseline  08-18-19    Time  3    Period  Weeks    Status  On-going         Patient will benefit from skilled therapeutic intervention in order to improve the following deficits and impairments:   Dysphagia, oropharyngeal phase    Problem List Patient Active Problem List   Diagnosis Date Noted  . Asthma with status asthmaticus 07/13/2013  . Hypothyroidism 07/13/2013  . Cerebral palsy ([place]) 07/13/2013  . Schizo affective schizophrenia ([place]) 07/13/2013  . 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 Anxiety 07/13/2013  . Status asthmaticus 07/13/2013    [facility] ,MS, CCC-SLP  09/06/2019, 4:20 PM  [facility] [address] [place], [place], [address] Phone: [phone]   Fax:  [phone]   Name: Angela Nicholson MRN: [id number] Date of Birth: 12-15-1970

## 2019-09-06 NOTE — Patient Instructions (Addendum)
  Pretzels or graham crackers with a little cream cheese or sour cream is ok. BUT if you cough with the pretzels STOP right away.  CHEW your food.  Go SLOW SLOW SLOW.

## 2019-09-12 ENCOUNTER — Ambulatory Visit: Payer: Medicare Other

## 2019-09-12 ENCOUNTER — Other Ambulatory Visit: Payer: Self-pay

## 2019-09-12 DIAGNOSIS — R1312 Dysphagia, oropharyngeal phase: Secondary | ICD-10-CM

## 2019-09-12 NOTE — Therapy (Signed)
[facility] [address] [place], [place], [address] Phone: [phone]   Fax:  [phone]  Speech Language Pathology Treatment/progress note  Patient Details  Name: Angela Nicholson MRN: [id number] Date of Birth: 1971/04/15 Referring Provider (SLP): Elvin So, PA-C   Encounter Date: 09/12/2019  End of Session - 09/12/19 1648    Visit Number  10    Number of Visits  17    Date for SLP Re-Evaluation  10/19/19    SLP Start Time  1151    SLP Stop Time   1233    SLP Time Calculation (min)  42 min    Activity Tolerance  Patient tolerated treatment well       Past Medical History:  Diagnosis Date  . Asthma   . Barrett esophagus   . Deaf   . GERD (gastroesophageal reflux disease)   . Schizo affective schizophrenia ([place])     No past surgical history on file.  There were no vitals filed for this visit.         ADULT SLP TREATMENT - 09/12/19 1640      General Information   Behavior/Cognition  Alert;Cooperative;Pleasant mood;Impulsive      Treatment Provided   Treatment provided  Dysphagia      Dysphagia Treatment   Temperature Spikes Noted  No    Respiratory Status  Room air    Oral Cavity - Dentition  Adequate natural dentition    Treatment Methods  Skilled observation;Therapeutic exercise;Compensation strategy training;Patient/caregiver education    Patient observed directly with PO's  Yes    Type of PO's observed  Regular;Nectar-thick liquids    Feeding  Able to feed self    Liquids provided via  Cup    Oral Phase Signs & Symptoms  --   none noted today   Pharyngeal Phase Signs & Symptoms  --   none noted   Other treatment/comments  Pt brought in pretzels, graham crackers, and pop tart and had each with sour cream - pt without any overt s/s aspiration and followed precautions without A. SLP ensured pt knew she has to eat slowly, and CHEW thoroughly. Also, SLP told pt that mother is telling  pt to slow down when she sees pt is not slowing down and that SLP has TOLD mother to tell pt to slow down if pt is not going slowly enough. Pt performed HEP with modified assistance (reading the handout). Pt with follow up modified (MBSS) tomorrow and one more appointment scheduled next week. SLP told pt and sister Bethena Roys that pt may benefit from a 72-monthscreening x2-3.       Assessment / Recommendations / Plan   Plan  Continue with current plan of care      Dysphagia Recommendations   Diet recommendations  Dysphagia 3 (mechanical soft);Nectar-thick liquid    Liquids provided via  Cup;No straw    Supervision  Intermittent supervision to cue for compensatory strategies    Compensations  Slow rate;Small sips/bites;Clear throat after each swallow    Postural Changes and/or Swallow Maneuvers  Upright 30-60 min after meal;Seated upright 90 degrees      Progression Toward Goals   Progression toward goals  Progressing toward goals         SLP Short Term Goals - 08/18/19 1226      SLP SHORT TERM GOAL #1   Title  pt will demo swallowing HEP with occasional mod A over three sessions    Baseline  08-04-19,  08-12-19, 08-16-19    Status  Achieved      SLP SHORT TERM GOAL #2   Title  pt will demo swallow precautions with POs during 3 sessions, with rare min-mod A    Baseline  08-16-19, 08-18-19    Status  Partially Met       SLP Long Term Goals - 09/12/19 1652      SLP LONG TERM GOAL #1   Title  pt will complete HEP with occasional min-mod A over three sessions    Baseline  08-04-19, 08-12-19, 08-16-19    Period  --   (or 17 sessions, for all LTGs)   Status  Achieved      SLP LONG TERM GOAL #2   Title  pt will follow swallow precautions with rare min A in 3 sessions    Baseline  08-16-19, 08-18-19    Status  Achieved      SLP LONG TERM GOAL #3   Title  pt will tell SLP how often she is to complete HEP after her next modified barium swallow exam, in 3 sessions    Time  2    Period  Weeks     Status  On-going      SLP LONG TERM GOAL #4   Title  pt will be independent with HEP in 3 sessions    Baseline  08-18-19, 09-12-19    Time  2    Period  Weeks    Status  Revised       Plan - 09/12/19 1649    Clinical Impression Statement  Follow up MBSS scheduled 09-13-19. Pt presents with decr'd safety with oropharyngeal swallowing c/b silent trace aspiration with thin liquids, premature spillage, and trace to mild vallecular an pyriform residues-worse with thicker POs per latest MBSS. See "other comments" for details re: today's session. She remains without overt s/s aspiration PNA. Pt would cont to benefit from one-two more skilled ST appointments to target accuracy of HEP procedure and habitualizing close adherence to swallow precautions. Pt's mother has improved her skills at monitoring and assisting pt with her performance, however SLP believes pt may further benefit, after d/c from this current therapy course, from 6 month "screening/check-in" appointments so pt knows there is follow up with this professional about her adherence to necessary swallow precautions.    Speech Therapy Frequency  1x /week    Duration  --   8 weeks or 17 visits   Treatment/Interventions  Aspiration precaution training;Pharyngeal strengthening exercises;Diet toleration management by SLP;Internal/external aids;Patient/family education;Compensatory strategies;Cueing hierarchy;SLP instruction and feedback;Trials of upgraded texture/liquids    Potential to Achieve Goals  Fair    Potential Considerations  Co-morbidities;Severity of impairments    SLP Home Exercise Plan  provided today    Consulted and Agree with Plan of Care  Patient       Patient will benefit from skilled therapeutic intervention in order to improve the following deficits and impairments:   Dysphagia, oropharyngeal phase   Speech Therapy Progress Note  Dates of Reporting Period: 07-21-19 to 09-12-19  Subjective Statement: Pt has been seen for  10 visits focusing on swallowing safety.  Objective Measurements: Pt has improved her success with performing swallowing HEP to modified independnent.   Goal Update: See above.  Plan: Pt with follow-up modified barium swallow eval tomorrow. Likely d/c in next 1-2 visits.  Reason Skilled Services are Required: Pt requires cont'd assistance with following swallow precautions at home vs.desire to eat without following precautions.  Problem List Patient Active Problem List   Diagnosis Date Noted  . Asthma with status asthmaticus 07/13/2013  . Hypothyroidism 07/13/2013  . Cerebral palsy ([place]) 07/13/2013  . Schizo affective schizophrenia ([place]) 07/13/2013  . 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 Anxiety 07/13/2013  . Status asthmaticus 07/13/2013    [facility] ,MS, CCC-SLP  09/12/2019, 4:53 PM  [facility] [address] [place], [place], [address] Phone: [phone]   Fax:  [phone]   Name: Angela Nicholson MRN: [id number] Date of Birth: [birth year]

## 2019-09-20 ENCOUNTER — Other Ambulatory Visit: Payer: Self-pay

## 2019-09-20 ENCOUNTER — Ambulatory Visit: Payer: Medicare Other | Attending: Physician Assistant

## 2019-09-20 DIAGNOSIS — R1312 Dysphagia, oropharyngeal phase: Secondary | ICD-10-CM | POA: Diagnosis present

## 2019-09-20 NOTE — Therapy (Signed)
[facility] [address] [place], [place], [address] Phone: [phone]   Fax:  [phone]  Speech Language Pathology Treatment  Patient Details  Name: Angela Nicholson MRN: [id number] Date of Birth: January 31, 1971 Referring Provider (SLP): Elvin So, PA-C   Encounter Date: 09/20/2019  End of Session - 09/20/19 1627    Visit Number  11    Number of Visits  17    Date for SLP Re-Evaluation  10/19/19    SLP Start Time  2694    SLP Stop Time   8546    SLP Time Calculation (min)  42 min       Past Medical History:  Diagnosis Date  . Asthma   . Barrett esophagus   . Deaf   . GERD (gastroesophageal reflux disease)   . Schizo affective schizophrenia ([place])     History reviewed. No pertinent surgical history.  There were no vitals filed for this visit.  Subjective Assessment - 09/20/19 1402    Subjective  Pt brought ziti today. Modified (MBSS) was attended but at wrong time.            ADULT SLP TREATMENT - 09/20/19 1619      General Information   Behavior/Cognition  Alert;Cooperative;Pleasant mood;Impulsive      Treatment Provided   Treatment provided  Dysphagia      Dysphagia Treatment   Temperature Spikes Noted  No    Respiratory Status  Room air    Oral Cavity - Dentition  Adequate natural dentition    Treatment Methods  Skilled observation;Therapeutic exercise;Compensation strategy training    Patient observed directly with PO's  Yes    Type of PO's observed  Dysphagia 3 (soft);Nectar-thick liquids    Feeding  Able to feed self    Liquids provided via  Cup    Oral Phase Signs & Symptoms  --   none noted today   Pharyngeal Phase Signs & Symptoms  --   none noted today   Other treatment/comments  Mother states pt eats slowly approx 50% of the time at home, uses small bites 50% at home. Coughing has lessened but still occurs at home, mother states. SLP strongly reiterated multiple times to pt  that when she adheres to precautions she does not cough (small bites, chew thoroughly, swallow twice-clear throat-swallow). Pt without overt s/sx aspiration today. Pt completed HEP with min A with super supraglottic only.      Assessment / Recommendations / Plan   Plan  --   one more visit after modified (MBSS); likely d/c then     Dysphagia Recommendations   Diet recommendations  Dysphagia 3 (mechanical soft);Nectar-thick liquid    Liquids provided via  Cup;No straw    Supervision  Intermittent supervision to cue for compensatory strategies    Compensations  Slow rate;Small sips/bites;Clear throat after each swallow   chew thoroughly   Postural Changes and/or Swallow Maneuvers  Upright 30-60 min after meal;Seated upright 90 degrees      Progression Toward Goals   Progression toward goals  Progressing toward goals       SLP Education - 09/20/19 1626    Education Details  must follow precautions to mitigate coughing/stay safe when eating    Person(s) Educated  Patient    Methods  Explanation;Verbal cues    Comprehension  Verbalized understanding;Returned demonstration;Need further instruction       SLP Short Term Goals - 08/18/19 1226      SLP SHORT TERM  GOAL #1   Title  pt will demo swallowing HEP with occasional mod A over three sessions    Baseline  08-04-19, 08-12-19, 08-16-19    Status  Achieved      SLP SHORT TERM GOAL #2   Title  pt will demo swallow precautions with POs during 3 sessions, with rare min-mod A    Baseline  08-16-19, 08-18-19    Status  Partially Met       SLP Long Term Goals - 09/20/19 1629      SLP LONG TERM GOAL #1   Title  pt will complete HEP with occasional min-mod A over three sessions    Baseline  08-04-19, 08-12-19, 08-16-19    Period  --   (or 17 sessions, for all LTGs)   Status  Achieved      SLP LONG TERM GOAL #2   Title  pt will follow swallow precautions with rare min A in 3 sessions    Baseline  08-16-19, 08-18-19    Status  Achieved       SLP LONG TERM GOAL #3   Title  pt will tell SLP how often she is to complete HEP after her next modified barium swallow exam, in 3 sessions    Time  1    Period  Weeks    Status  On-going      SLP LONG TERM GOAL #4   Title  pt will be independent with HEP in 3 sessions    Baseline  08-18-19, 09-12-19    Time  1    Period  Weeks    Status  On-going       Plan - 09/20/19 1628    Clinical Impression Statement  Follow up MBSS rescheduled for 10-04-19. Per last MBSS pt presents with decr'd safety with oropharyngeal swallowing c/b silent trace aspiration with thin liquids, premature spillage, and trace to mild vallecular an pyriform residues-worse with thicker POs per latest MBSS. See "other comments" for details re: today's session. She remains without overt s/s aspiration PNA. Pt would cont to benefit from one-two more skilled ST appointments to target accuracy of HEP procedure and habitualizing close adherence to swallow precautions. Pt's mother has improved her skills at monitoring and assisting pt with her performance, however SLP believes pt may further benefit, after d/c from this current therapy course, from 6 month "screening/check-in" appointments so pt knows there is follow up with this professional about her adherence to necessary swallow precautions.Last session likely next session.    Speech Therapy Frequency  1x /week    Duration  --   8 weeks or 17 visits   Treatment/Interventions  Aspiration precaution training;Pharyngeal strengthening exercises;Diet toleration management by SLP;Internal/external aids;Patient/family education;Compensatory strategies;Cueing hierarchy;SLP instruction and feedback;Trials of upgraded texture/liquids    Potential to Achieve Goals  Fair    Potential Considerations  Co-morbidities;Severity of impairments    SLP Home Exercise Plan  provided today    Consulted and Agree with Plan of Care  Patient       Patient will benefit from skilled therapeutic  intervention in order to improve the following deficits and impairments:   Dysphagia, oropharyngeal phase    Problem List Patient Active Problem List   Diagnosis Date Noted  . Asthma with status asthmaticus 07/13/2013  . Hypothyroidism 07/13/2013  . Cerebral palsy ([place]) 07/13/2013  . Schizo affective schizophrenia ([place]) 07/13/2013  . 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 Anxiety 07/13/2013  . Status asthmaticus 07/13/2013    [facility] ,MS, CCC-SLP  09/20/2019, 4:29 PM  [facility] [address] [place], [place], [address] Phone: [phone]   Fax:  [phone]   Name: Angela Nicholson MRN: [id number] Date of Birth: May 07, 1971

## 2019-10-07 ENCOUNTER — Other Ambulatory Visit: Payer: Self-pay

## 2019-10-07 ENCOUNTER — Ambulatory Visit: Payer: Medicare Other

## 2019-10-07 DIAGNOSIS — R1312 Dysphagia, oropharyngeal phase: Secondary | ICD-10-CM | POA: Diagnosis not present

## 2019-10-07 NOTE — Patient Instructions (Signed)
Keep doing your exercises! SLOW DOWN!  No more crumbly foods unless you put liquid or sauce with them. For example, dunking graham or pop tart in milk.

## 2019-10-07 NOTE — Therapy (Signed)
[facility] [address] [place], [place], [address] Phone: [phone]   Fax:  [phone]  Speech Language Pathology Treatment  Patient Details  Name: Angela Nicholson MRN: [id number] Date of Birth: [birth year] Referring Provider (SLP): Elvin So, PA-C   Encounter Date: 10/07/2019  End of Session - 10/07/19 1241    Visit Number  12    Number of Visits  17    Date for SLP Re-Evaluation  12/02/19    SLP Start Time  4163    SLP Stop Time   1220    SLP Time Calculation (min)  32 min    Activity Tolerance  Patient tolerated treatment well       Past Medical History:  Diagnosis Date  . Asthma   . Barrett esophagus   . Deaf   . GERD (gastroesophageal reflux disease)   . Schizo affective schizophrenia ([place])     No past surgical history on file.  There were no vitals filed for this visit.  Subjective Assessment - 10/07/19 1224    Subjective  Modified (MBSS) on 10-04-19. Regular (minus "crumbly/dry")/thin recommended    Patient is accompained by:  Interpreter;Family member    Currently in Pain?  No/denies            ADULT SLP TREATMENT - 10/07/19 1202      General Information   Behavior/Cognition  Alert;Cooperative;Pleasant mood;Impulsive      Treatment Provided   Treatment provided  Dysphagia      Dysphagia Treatment   Temperature Spikes Noted  No    Respiratory Status  Room air    Oral Cavity - Dentition  Adequate natural dentition    Treatment Methods  Skilled observation;Therapeutic exercise;Compensation strategy training    Patient observed directly with PO's  Yes    Type of PO's observed  Regular;Thin liquids    Feeding  Able to feed self    Liquids provided via  Cup    Oral Phase Signs & Symptoms  --   none noted    Pharyngeal Phase Signs & Symptoms  --   none noted   Other treatment/comments  Modified found premature spillage to vallecuae with all bolusesand to pyriform with  liquids. Decr'd timing and coordination of initiation led to decr epiglottic deflection, decr'd laryngeal closure. Penetration with nect and thin (silent penetration and silent aspiration with nectar. trace residuals - in valleculae with solids and in pyriforms wih liquids. Pt with cues for stronger throat clear but independent with sequencing of precautions today with moist cake and water/prune juice mix. With HEP pt with cues to slow down count of seconds but otherwise independent. Mother asking about appointents every month after d/c from this tx course. SLP told pt that is too often - we would discuss when about ready to d/c. SLP stressed to pt tha that mother will observe pt periodically and she is to heed mother's advice re: swallowing. Pt acknowledged understanding.       Assessment / Recommendations / Plan   Plan  Goals updated      Dysphagia Recommendations   Diet recommendations  Regular;Thin liquid    Liquids provided via  Cup;No straw    Supervision  Intermittent supervision to cue for compensatory strategies    Compensations  Slow rate;Small sips/bites;Clear throat after each swallow    Postural Changes and/or Swallow Maneuvers  Upright 30-60 min after meal;Seated upright 90 degrees      Progression Toward Goals   Progression toward  goals  Progressing toward goals       SLP Education - 10/07/19 1241    Education Details  adhere to mom's advice at mealtime    Person(s) Educated  Patient    Methods  Explanation    Comprehension  Verbalized understanding       SLP Short Term Goals - 08/18/19 1226      SLP SHORT TERM GOAL #1   Title  pt will demo swallowing HEP with occasional mod A over three sessions    Baseline  08-04-19, 08-12-19, 08-16-19    Status  Achieved      SLP SHORT TERM GOAL #2   Title  pt will demo swallow precautions with POs during 3 sessions, with rare min-mod A    Baseline  08-16-19, 08-18-19    Status  Partially Met       SLP Long Term Goals - 10/07/19  1244      SLP LONG TERM GOAL #1   Title  pt will complete HEP with occasional min-mod A over three sessions    Baseline  08-04-19, 08-12-19, 08-16-19    Period  --   (or 17 sessions, for all LTGs)   Status  Achieved      SLP LONG TERM GOAL #2   Title  pt will follow swallow precautions with rare min A in 3 sessions    Baseline  08-16-19, 08-18-19    Status  Achieved      SLP LONG TERM GOAL #3   Title  pt will tell SLP how often she is to complete HEP after her next modified barium swallow exam, in 3 sessions    Time  4    Period  --   sessions   Status  On-going      SLP LONG TERM GOAL #4   Title  pt will be independent with HEP in 5 sessions    Baseline  08-18-19, 09-12-19, 10-07-19    Time  4    Period  --   visits   Status  Revised      SLP LONG TERM GOAL #5   Title  pt will follow swallow precautions with POs in 3 sessions    Time  4    Period  --   sessions   Status  New       Plan - 10/07/19 1242    Clinical Impression Statement  Follow up MBSS copmleted 10-04-19; see "other treatment/comments" for details. Regular diet/thin liqiuds recommended. Pt remains without overt s/s aspiration PNA today.and was largely independent with HEP. Pt would cont to benefit from skilled ST appointments every other week x3-4, to target accuracy of HEP procedure and habitualizing close adherence to swallow precautions. SLP believes pt may further benefit, after d/c from this current therapy course, from 6 month "screening/check-in" appointments so pt knows there is follow up with this professional about her adherence to necessary swallow precautions.    Speech Therapy Frequency  --   every other week   Duration  --   8 weeks or 17 visits   Treatment/Interventions  Aspiration precaution training;Pharyngeal strengthening exercises;Diet toleration management by SLP;Internal/external aids;Patient/family education;Compensatory strategies;Cueing hierarchy;SLP instruction and feedback;Trials of  upgraded texture/liquids    Potential to Achieve Goals  Fair    Potential Considerations  Co-morbidities;Severity of impairments    SLP Home Exercise Plan  provided today    Consulted and Agree with Plan of Care  Patient       Patient will  benefit from skilled therapeutic intervention in order to improve the following deficits and impairments:   Dysphagia, oropharyngeal phase - Plan: SLP plan of care cert/re-cert    Problem List Patient Active Problem List   Diagnosis Date Noted  . Asthma with status asthmaticus 07/13/2013  . Hypothyroidism 07/13/2013  . Cerebral palsy ([place]) 07/13/2013  . Schizo affective schizophrenia ([place]) 07/13/2013  . 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 Anxiety 07/13/2013  . Status asthmaticus 07/13/2013    [facility] ,[place], CCC-SLP  10/07/2019, 12:48 PM  [facility] [address] [place], [place], [address] Phone: [phone]   Fax:  [phone]   Name: Angela Nicholson MRN: [id number] Date of Birth: Nov 04, 1970

## 2019-11-02 ENCOUNTER — Ambulatory Visit: Payer: Medicare Other | Attending: Physician Assistant

## 2019-11-02 ENCOUNTER — Other Ambulatory Visit: Payer: Self-pay

## 2019-11-02 DIAGNOSIS — R1312 Dysphagia, oropharyngeal phase: Secondary | ICD-10-CM

## 2019-11-02 NOTE — Therapy (Signed)
[facility] [address] [place], [place], [address] Phone: [phone]   Fax:  [phone]  Speech Language Pathology Treatment  Patient Details  Name: Angela Nicholson MRN: [id number] Date of Birth: 1970/11/18 Referring Provider (SLP): Elvin So, PA-C   Encounter Date: 11/02/2019  End of Session - 11/02/19 1137    Visit Number  13    Number of Visits  17    Date for SLP Re-Evaluation  12/02/19    SLP Start Time  94    SLP Stop Time   1051    SLP Time Calculation (min)  31 min    Activity Tolerance  Patient tolerated treatment well       Past Medical History:  Diagnosis Date  . Asthma   . Barrett esophagus   . Deaf   . GERD (gastroesophageal reflux disease)   . Schizo affective schizophrenia ([place])     No past surgical history on file.  There were no vitals filed for this visit.  Subjective Assessment - 11/02/19 1115    Subjective  Pt attends with mom and interpreter.    Patient is accompained by:  Interpreter;Family member    Currently in Pain?  No/denies            ADULT SLP TREATMENT - 11/02/19 1116      General Information   Behavior/Cognition  Alert;Cooperative;Pleasant mood;Impulsive      Treatment Provided   Treatment provided  Dysphagia      Dysphagia Treatment   Temperature Spikes Noted  No    Respiratory Status  Room air    Oral Cavity - Dentition  Adequate natural dentition    Treatment Methods  Skilled observation;Therapeutic exercise;Compensation strategy training;Patient/caregiver education    Patient observed directly with PO's  Yes    Type of PO's observed  Nectar-thick liquids   thin nectar liquids   Feeding  Able to feed self    Liquids provided via  Cup    Oral Phase Signs & Symptoms  --   none noted   Pharyngeal Phase Signs & Symptoms  --   none noted   Other treatment/comments  Pt with questions about toasted italian bread - SLP strongly encouraged pt to  use sauces with breads, more dense foods, and toasted breads. She worked through her HEP with min cues/reminders to slow down the pace of exercises. Clinically SLP believes pt is ready to go four weeks hiatus, however mother requested two-week hiatus.       Assessment / Recommendations / Plan   Plan  Continue with current plan of care      Dysphagia Recommendations   Diet recommendations  Regular;Thin liquid    Liquids provided via  Cup;No straw    Supervision  Intermittent supervision to cue for compensatory strategies    Compensations  Slow rate;Small sips/bites;Clear throat after each swallow   sauces/condiments with drier or tougher items   Postural Changes and/or Swallow Maneuvers  Upright 30-60 min after meal;Seated upright 90 degrees      Progression Toward Goals   Progression toward goals  Progressing toward goals       SLP Education - 11/02/19 1136    Education Details  modifications for drier/tougher foods    Person(s) Educated  Patient;Parent(s)    Methods  Explanation    Comprehension  Verbalized understanding       SLP Short Term Goals - 08/18/19 1226      SLP SHORT TERM GOAL #1  Title  pt will demo swallowing HEP with occasional mod A over three sessions    Baseline  08-04-19, 08-12-19, 08-16-19    Status  Achieved      SLP SHORT TERM GOAL #2   Title  pt will demo swallow precautions with POs during 3 sessions, with rare min-mod A    Baseline  08-16-19, 08-18-19    Status  Partially Met       SLP Long Term Goals - 11/02/19 1140      SLP LONG TERM GOAL #1   Title  pt will complete HEP with occasional min-mod A over three sessions    Baseline  08-04-19, 08-12-19, 08-16-19    Period  --   (or 17 sessions, for all LTGs)   Status  Achieved      SLP LONG TERM GOAL #2   Title  pt will follow swallow precautions with rare min A in 3 sessions    Baseline  08-16-19, 08-18-19    Status  Achieved      SLP LONG TERM GOAL #3   Title  pt will tell SLP how often she is to  complete HEP after her next modified barium swallow exam, in 3 sessions    Baseline  11-02-19    Time  3    Period  --   sessions   Status  On-going      SLP LONG TERM GOAL #4   Title  pt will be independent with HEP in 5 sessions    Baseline  08-18-19, 09-12-19, 10-07-19, 11-02-19    Time  3    Period  --   visits   Status  On-going      SLP LONG TERM GOAL #5   Title  pt will follow swallow precautions with POs in 3 sessions    Baseline  11-02-19    Time  3    Period  --   sessions   Status  On-going       Plan - 11/02/19 1137    Clinical Impression Statement  See "skilled intervention" for details. Regular diet/thin liqiuds recommended from latest MBSS. Pt remains without overt s/s aspiration PNA with POs today and was largely independent with HEP. SLP believes pt would be fine for 4 week hiatus but mother requested 2-week hiatus. Pt would cont to benefit from skilled ST appointments every other week x3-4, to target accuracy of HEP procedure and habitualizing close adherence to swallow precautions. SLP believes pt may further benefit, after d/c from this current therapy course, from 6 month "screening/check-in" appointments so pt knows there is follow up with this professional about her adherence to necessary swallow precautions.    Speech Therapy Frequency  --   every other week   Duration  --   8 weeks or 17 visits   Treatment/Interventions  Aspiration precaution training;Pharyngeal strengthening exercises;Diet toleration management by SLP;Internal/external aids;Patient/family education;Compensatory strategies;Cueing hierarchy;SLP instruction and feedback;Trials of upgraded texture/liquids    Potential to Achieve Goals  Fair    Potential Considerations  Co-morbidities;Severity of impairments    SLP Home Exercise Plan  provided today    Consulted and Agree with Plan of Care  Patient       Patient will benefit from skilled therapeutic intervention in order to improve the following  deficits and impairments:   Dysphagia, oropharyngeal phase    Problem List Patient Active Problem List   Diagnosis Date Noted  . Asthma with status asthmaticus 07/13/2013  . Hypothyroidism 07/13/2013  .  Cerebral palsy ([place]) 07/13/2013  . Schizo affective schizophrenia ([place]) 07/13/2013  . 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 Anxiety 07/13/2013  . Status asthmaticus 07/13/2013    [facility] ,MS, CCC-SLP  11/02/2019, 11:41 AM  [facility] [address] [place], [place], [address] Phone: [phone]   Fax:  [phone]   Name: Andraya Frigon MRN: [id number] Date of Birth: 1970/11/16

## 2019-11-18 ENCOUNTER — Other Ambulatory Visit: Payer: Self-pay

## 2019-11-18 ENCOUNTER — Ambulatory Visit: Payer: Medicare Other

## 2019-11-18 DIAGNOSIS — R1312 Dysphagia, oropharyngeal phase: Secondary | ICD-10-CM

## 2019-11-18 NOTE — Therapy (Signed)
[facility] [address] [place], [place], [address] Phone: [phone]   Fax:  [phone]  Speech Language Pathology Treatment  Patient Details  Name: Angela Nicholson MRN: [id number] Date of Birth: 04/02/1971 Referring Provider (SLP): Elvin So, PA-C   Encounter Date: 11/18/2019  End of Session - 11/18/19 1526    Visit Number  14    Number of Visits  17    Date for SLP Re-Evaluation  12/02/19    SLP Start Time  1450    SLP Stop Time   1520    SLP Time Calculation (min)  30 min    Activity Tolerance  Patient tolerated treatment well       Past Medical History:  Diagnosis Date  . Asthma   . Barrett esophagus   . Deaf   . GERD (gastroesophageal reflux disease)   . Schizo affective schizophrenia ([place])     History reviewed. No pertinent surgical history.  There were no vitals filed for this visit.  Subjective Assessment - 11/18/19 1520    Subjective  Pt alone - mom with back pain.    Patient is accompained by:  Interpreter    Currently in Pain?  No/denies            ADULT SLP TREATMENT - 11/18/19 1521      General Information   Behavior/Cognition  Alert;Cooperative;Pleasant mood;Impulsive      Treatment Provided   Treatment provided  Dysphagia      Dysphagia Treatment   Temperature Spikes Noted  No    Respiratory Status  Room air    Oral Cavity - Dentition  Adequate natural dentition    Treatment Methods  Skilled observation;Therapeutic exercise;Compensation strategy training    Patient observed directly with PO's  Yes    Type of PO's observed  Nectar-thick liquids;Regular   with dip in tomato soup   Feeding  Able to feed self    Liquids provided via  --   bottle   Oral Phase Signs & Symptoms  Other (comment)   none noted   Pharyngeal Phase Signs & Symptoms  Other (comment)   none noted   Other treatment/comments  Pt presents today with modified inedpendence with HEP, and no  coughing or choking observed. No overt s/sx aspiration PNA. SLP told mother that if next session looked this good that pt would be dc'd.       Assessment / Recommendations / Plan   Plan  Continue with current plan of care   possible d/c next session     Dysphagia Recommendations   Diet recommendations  Regular;Thin liquid;Nectar-thick liquid    Liquids provided via  Cup;No straw    Supervision  Intermittent supervision to cue for compensatory strategies    Compensations  Slow rate;Small sips/bites;Clear throat after each swallow    Postural Changes and/or Swallow Maneuvers  Upright 30-60 min after meal;Seated upright 90 degrees      Progression Toward Goals   Progression toward goals  Progressing toward goals         SLP Short Term Goals - 08/18/19 1226      SLP SHORT TERM GOAL #1   Title  pt will demo swallowing HEP with occasional mod A over three sessions    Baseline  08-04-19, 08-12-19, 08-16-19    Status  Achieved      SLP SHORT TERM GOAL #2   Title  pt will demo swallow precautions with POs during 3 sessions, with rare  min-mod A    Baseline  08-16-19, 08-18-19    Status  Partially Met       SLP Long Term Goals - 11/18/19 1529      SLP LONG TERM GOAL #1   Title  pt will complete HEP with occasional min-mod A over three sessions    Baseline  08-04-19, 08-12-19, 08-16-19    Period  --   (or 17 sessions, for all LTGs)   Status  Achieved      SLP LONG TERM GOAL #2   Title  pt will follow swallow precautions with rare min A in 3 sessions    Baseline  08-16-19, 08-18-19    Status  Achieved      SLP LONG TERM GOAL #3   Title  pt will tell SLP how often she is to complete HEP after her next modified barium swallow exam, in 3 sessions    Baseline  11-02-19, 11-18-19    Time  2    Period  --   sessions   Status  On-going      SLP LONG TERM GOAL #4   Title  pt will be independent with HEP in 5 sessions    Baseline  08-18-19, 09-12-19, 10-07-19, 11-02-19    Status  Achieved       SLP LONG TERM GOAL #5   Title  pt will follow swallow precautions with POs in 3 sessions    Baseline  11-02-19, 11-18-19    Time  2    Period  --   sessions   Status  On-going       Plan - 11/18/19 1527    Clinical Impression Statement  See "other comments" for details. Regular diet/thin liqiuds recommended from latest MBSS. Pt remains without overt s/s aspiration PNA with POs today and was modified independent with HEP. SLP believes pt would be fine for 4 week hiatus but mother requested 2-week hiatus. Pt would cont to benefit from skilled ST appointments every other week x3-4, to target accuracy of HEP procedure and habitualizing close adherence to swallow precautions. SLP believes pt may further benefit, after d/c from this current therapy course, from "screening/check-in" appointments completed PRN with a prescription so pt knows there is follow up with this professional about her adherence to necessary swallow precautions.    Speech Therapy Frequency  --   every other week   Duration  --   8 weeks or 17 visits   Treatment/Interventions  Aspiration precaution training;Pharyngeal strengthening exercises;Diet toleration management by SLP;Internal/external aids;Patient/family education;Compensatory strategies;Cueing hierarchy;SLP instruction and feedback;Trials of upgraded texture/liquids    Potential to Achieve Goals  Fair    Potential Considerations  Co-morbidities;Severity of impairments    SLP Home Exercise Plan  provided today    Consulted and Agree with Plan of Care  Patient       Patient will benefit from skilled therapeutic intervention in order to improve the following deficits and impairments:   Dysphagia, oropharyngeal phase    Problem List Patient Active Problem List   Diagnosis Date Noted  . Asthma with status asthmaticus 07/13/2013  . Hypothyroidism 07/13/2013  . Cerebral palsy ([place]) 07/13/2013  . Schizo affective schizophrenia ([place]) 07/13/2013  . Deafness or hearing  loss of type classifiable to 389.0 with type classifiable to 389.1 07/13/2013  . 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 Anxiety 07/13/2013  . Status asthmaticus 07/13/2013    [facility] ,[place], CCC-SLP  11/18/2019, 3:30 PM  [facility] [address]  [place], [place], [address] Phone: [phone]   Fax:  [phone]   Name: Angela Nicholson MRN: [id number] Date of Birth: 12-02-1970

## 2019-12-02 ENCOUNTER — Other Ambulatory Visit: Payer: Self-pay

## 2019-12-02 ENCOUNTER — Ambulatory Visit: Payer: Medicare Other | Attending: Physician Assistant

## 2019-12-02 DIAGNOSIS — R1312 Dysphagia, oropharyngeal phase: Secondary | ICD-10-CM | POA: Diagnosis not present

## 2019-12-02 NOTE — Patient Instructions (Signed)
Continue to do the exercises each day.

## 2019-12-02 NOTE — Therapy (Signed)
[facility] [address] [place], [place], [address] Phone: [phone]   Fax:  [phone]  Speech Language Pathology Treatment/Discharge summary  Patient Details  Name: Angela Nicholson MRN: [id number] Date of Birth: 08-Mar-1971 Referring Provider (SLP): Elvin So, PA-C   Encounter Date: 12/02/2019  End of Session - 12/02/19 1251    Visit Number  15    Number of Visits  17    Date for SLP Re-Evaluation  12/02/19    SLP Start Time  1150    SLP Stop Time   1222    SLP Time Calculation (min)  32 min    Activity Tolerance  Patient tolerated treatment well       Past Medical History:  Diagnosis Date  . Asthma   . Barrett esophagus   . Deaf   . GERD (gastroesophageal reflux disease)   . Schizo affective schizophrenia ([place])     History reviewed. No pertinent surgical history.  There were no vitals filed for this visit.  Subjective Assessment - 12/02/19 1206    Subjective  Pt brought exercises in with her today. Motherstates pt coughs rarely with POs. She walks to pts room to remind her to slow down.    Patient is accompained by:  Family member;Interpreter   moter   Currently in Pain?  No/denies            ADULT SLP TREATMENT - 12/02/19 1242      General Information   Behavior/Cognition  Alert;Cooperative;Pleasant mood;Impulsive      Treatment Provided   Treatment provided  Dysphagia      Dysphagia Treatment   Temperature Spikes Noted  No    Respiratory Status  Room air    Oral Cavity - Dentition  Adequate natural dentition    Treatment Methods  Skilled observation;Therapeutic exercise;Compensation strategy training    Patient observed directly with PO's  Yes    Type of PO's observed  --   nectar/thin prune juice   Feeding  Able to feed self    Liquids provided via  Cup    Oral Phase Signs & Symptoms  --   none noted   Pharyngeal Phase Signs & Symptoms  --   none noted   Other  treatment/comments  Pt was modified independent with HEP today. Mother reports pt is coughing much less than prior to ST. When she does, mother reminds pt to slow pace of eating and this assists. Mother reports pt is dipping drier or moure crumbly foods in some sauce or dip in order to soften/make bolus  more cohesive.      Assessment / Recommendations / Plan   Plan  Discharge SLP treatment due to (comment)   pt reached her rehab potential at this time     Dysphagia Recommendations   Diet recommendations  Regular;Thin liquid    Liquids provided via  Cup;No straw    Supervision  Intermittent supervision to cue for compensatory strategies    Compensations  Slow rate;Small sips/bites;Clear throat after each swallow    Postural Changes and/or Swallow Maneuvers  Upright 30-60 min after meal;Seated upright 90 degrees      Progression Toward Goals   Progression toward goals  Goals met, education completed, patient discharged from [facility] - 08/18/19 [place] #1   Title  pt will demo swallowing HEP with occasional mod A over  three sessions    Baseline  08-04-19, 08-12-19, 08-16-19    Status  Achieved      SLP SHORT TERM GOAL #2   Title  pt will demo swallow precautions with POs during 3 sessions, with rare min-mod A    Baseline  08-16-19, 08-18-19    Status  Partially Met       SLP Long Term Goals - 12/02/19 1252      SLP LONG TERM GOAL #1   Title  pt will complete HEP with occasional min-mod A over three sessions    Baseline  08-04-19, 08-12-19, 08-16-19    Period  --   (or 17 sessions, for all LTGs)   Status  Achieved      SLP LONG TERM GOAL #2   Title  pt will follow swallow precautions with rare min A in 3 sessions    Baseline  08-16-19, 08-18-19    Status  Achieved      SLP LONG TERM GOAL #3   Title  pt will tell SLP how often she is to complete HEP after her next modified barium swallow exam, in 3 sessions    Baseline  11-02-19,  11-18-19, 12-02-19    Status  Achieved      SLP LONG TERM GOAL #4   Title  pt will be independent with HEP in 5 sessions    Baseline  08-18-19, 09-12-19, 10-07-19, 11-02-19    Status  Achieved      SLP LONG TERM GOAL #5   Title  pt will follow swallow precautions with POs in 3 sessions    Baseline  11-02-19, 11-18-19    Status  Partially Met       Plan - 12/02/19 1251    Clinical Impression Statement  See "other comments" for details. Regular diet/thin liqiuds recommended from latest MBSS. Pt remains without overt s/s aspiration PNA with POs today and was modified independent with HEP. Pt made pt/mother aware that pt will need a new script should she want to return for ST.    Treatment/Interventions  Aspiration precaution training;Pharyngeal strengthening exercises;Diet toleration management by SLP;Internal/external aids;Patient/family education;Compensatory strategies;Cueing hierarchy;SLP instruction and feedback;Trials of upgraded texture/liquids    Potential to Achieve Goals  Fair    Potential Considerations  Co-morbidities;Severity of impairments    SLP Home Exercise Plan  provided today    Consulted and Agree with Plan of Care  Patient       Patient will benefit from skilled therapeutic intervention in order to improve the following deficits and impairments:   Dysphagia, oropharyngeal phase   SPEECH THERAPY DISCHARGE SUMMARY  Visits from Start of Care: 15  Current functional level related to goals / functional outcomes: Pt met or partially met all goals. Her frequency of overt diffiuclty with POs has decreased considerably since initiation of latest course of ST.  Pt has re-developed good habits to ensure safety with POs and to cont to perform swallowing HEP.    Remaining deficits: Mild dysphagia.   Education / Equipment: HEP, using sauces and dips to moisten drier foods.   Plan: Patient agrees to discharge.  Patient goals were partially met. Patient is being discharged due to                                                      ?????  met her current potential.       Problem List Patient Active Problem List   Diagnosis Date Noted  . Asthma with status asthmaticus 07/13/2013  . Hypothyroidism 07/13/2013  . Cerebral palsy ([place]) 07/13/2013  . Schizo affective schizophrenia ([place]) 07/13/2013  . 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 Anxiety 07/13/2013  . Status asthmaticus 07/13/2013    [facility] ,[place], CCC-SLP  12/02/2019, 12:53 PM  [facility] [address] [place], [place], [address] Phone: [phone]   Fax:  [phone]   Name: Angela Nicholson MRN: [id number] Date of Birth: [birth year]

## 2020-11-06 ENCOUNTER — Other Ambulatory Visit (HOSPITAL_COMMUNITY): Payer: Self-pay | Admitting: *Deleted

## 2020-11-06 DIAGNOSIS — R1310 Dysphagia, unspecified: Secondary | ICD-10-CM

## 2020-11-16 ENCOUNTER — Other Ambulatory Visit: Payer: Self-pay

## 2020-11-16 ENCOUNTER — Ambulatory Visit (HOSPITAL_COMMUNITY)
Admission: RE | Admit: 2020-11-16 | Discharge: 2020-11-16 | Disposition: A | Discharge status: Home / Self Care | Payer: Medicare Other | Source: Ambulatory Visit | Attending: Family Medicine | Admitting: Family Medicine

## 2020-11-16 DIAGNOSIS — R1310 Dysphagia, unspecified: Secondary | ICD-10-CM | POA: Insufficient documentation

## 2020-11-16 NOTE — Progress Notes (Signed)
Modified Barium Swallow Progress Note  Patient Details  Name: Angela Nicholson MRN: [id number] Date of Birth: 28-Mar-1971  Today's Date: 11/16/2020  Modified Barium Swallow completed.  Full report located under Chart Review in the Imaging Section.  Brief recommendations include the following:  Clinical Impression  Pt has a mild oropharyngeal dysphagia primarily characterized by impaired timing, which results in trace amounts of penetration (PAS 3) that can be easily cleared with a cued cough. Pt also had two instance of silent aspiration with thin liquids due to premature spillage from the oral cavity. Silent aspiration also occurred with nectar thick liquids when premature spillage and impaired timing were noted. With thin liquids and use of a straw to facilitate a chin tuck, no aspiration occurred. Recommend continuing her current diet of chopped foods and thin liquids, but would use a chin tuck with liquids. Given a change in swallowing function and need for new strategies, recommend return to OP SLP.   Swallow Evaluation Recommendations       SLP Diet Recommendations: Dysphagia 2 (Fine chop) solids;Thin liquid   Liquid Administration via: Straw   Medication Administration: Whole meds with puree   Supervision: Patient able to self feed;Full supervision/cueing for compensatory strategies   Compensations: Slow rate;Small sips/bites;Chin tuck;Use straw to facilitate chin tuck   Postural Changes: Seated upright at 90 degrees   Oral Care Recommendations: Oral care BID        Mahala Menghini., M.A. CCC-SLP Acute Rehabilitation Services Pager [phone] Office [phone]  11/16/2020,1:34 PM

## 2020-11-23 ENCOUNTER — Ambulatory Visit: Payer: Medicare Other | Attending: Family Medicine

## 2020-11-23 ENCOUNTER — Other Ambulatory Visit: Payer: Self-pay

## 2020-11-23 DIAGNOSIS — R1312 Dysphagia, oropharyngeal phase: Secondary | ICD-10-CM | POA: Diagnosis present

## 2020-11-23 NOTE — Patient Instructions (Addendum)
SWALLOWING EXERCISES Do these every day until July 4th, 2022 - then 3 times a week  1. HARD Swallows- strong                               - Take sips if you need to - 12-15 times, 2 times a day, and use whenever you eat or drink  2. Swallow with your tongue sticking out - use a drop of water from a spoon - 12-15 times, 2 times a day  4. Roll up a towel and keep it UNDER your chin for 60 seconds  3 times, 2 times a day        5.  Super Swallow            - take a breath and hold it. Swallow and then cough hard             - 10 times, 2 times a day        6.   Hold a half teaspoon of water in your mouth for 10 seconds and swallow hard            - 12-15 times, 2 times a day                  7.    Hold your tongue with gauze and pull it back 12-15 times, 2 times a day  (we will do this one together to start)

## 2020-11-25 NOTE — Therapy (Signed)
[facility] [address] [place], [place], [address] Phone: [phone]   Fax:  [phone]  Speech Language Pathology Evaluation  Patient Details  Name: Angela Nicholson MRN: [id number] Date of Birth: [birth year] Referring Provider (SLP): Delano Metz, MD   Encounter Date: 11/23/2020   End of Session - 11/25/20 0001    Visit Number 1    Number of Visits 11    Date for SLP Re-Evaluation 01/22/21    SLP Start Time 1455    SLP Stop Time  1532    SLP Time Calculation (min) 37 min    Activity Tolerance Patient tolerated treatment well           Past Medical History:  Diagnosis Date  . Asthma   . Barrett esophagus   . Deaf   . GERD (gastroesophageal reflux disease)   . Schizo affective schizophrenia (HCC)     History reviewed. No pertinent surgical history.  There were no vitals filed for this visit.       SLP Evaluation [facility] - 11/25/20 0001      SLP Visit Information   SLP Received On 11/23/20    Referring Provider (SLP) Delano Metz, MD    Onset Date April 2022    Medical Diagnosis Oropharyngeal dysphagia      Subjective   Patient/Family Stated Goal "I want to do my swallowing right."      General Information   HPI Pt well-known to this SLP from previous therapy courses. Most recent MBSS ID'd aspiration and silent aspiration with thin and nectar alleviated with a chin tuck. Pt had recent MBSS ordered due to reports of increased throat/chest congestion. Pt has a h/o dysphagia for which she had a PEG, that has since been removed. Last OP course was completed in May 2021 and pt advanced to dysIII/regular solids, thin liquids.MBSS done outside [facility] system on 10-04-19 ([facility] Everywhere) and soft solid diet was recommended (no "crumbly/dry" items), thin liquids. PMH also includes: CP, Barretts esophagus, GERD, Deaf, schizo affective schizophrenia, anxiety, asthma. Pt congenitally deaf.       Prior Functional Status   Cognitive/Linguistic Baseline Baseline deficits    Baseline deficit details decr'd attention/awareness, executive function    Type of Home House     Lives With Family      Cognition   Overall Cognitive Status History of cognitive impairments - at baseline    Behaviors Impulsive      Auditory Comprehension   Overall Auditory Comprehension Appears within functional limits for tasks assessed      Verbal Expression   Overall Verbal Expression Appears within functional limits for tasks assessed      Oral Motor/Sensory Function   Overall Oral Motor/Sensory Function Impaired    Labial Strength Reduced    Lingual Strength Reduced    Lingual Coordination Reduced      Motor Speech   Overall Motor Speech --   difficult to assess due to lifet-long deafness                          SLP Education - 11/25/20 1900    Education Details procedure for HEP, pt needs to SLOW DOWN with POs, procedure for chin tuck, recommending soft/dys III diet with thin liquids with chin tuck    Person(s) Educated Patient;Child(ren)    Methods Explanation;Demonstration;Verbal cues;Handout    Comprehension Verbalized understanding;Returned demonstration;Verbal cues required;Need further instruction  SLP Short Term Goals - 11/25/20 1933      SLP SHORT TERM GOAL #1   Title pt will demo swallowing HEP with rare min A over two sessions    Time 3    Period Weeks    Status New      SLP SHORT TERM GOAL #2   Title pt will demo swallow precautions with POs with rare min-mod A in 2 sessions,    Time 3    Period Weeks    Status New            SLP Long Term Goals - 11/25/20 1930      SLP LONG TERM GOAL #1   Title pt will complete HEP independently over three sessions    Time 6    Period Weeks   or 13 total visits, for all LTGs   Status New      SLP LONG TERM GOAL #2   Title pt will follow swallow precautions with rare min A in 3 sessions    Time  6    Period Weeks    Status New      SLP LONG TERM GOAL #3   Title pt will use chin tuck correctly in 15/15 swallows in 3 sessions    Time 6    Period Weeks    Status New            Plan - 11/25/20 1919    Clinical Impression Statement Angela Nicholson presents today with mild oropharyngeal dysphagia as ID'd by modified (MBSS) 11-16-20 with silent aspiration with thin and nectar alleviated with chin tuck. Imprecise timing of swallow was main reason for aspiration. Small bites/sips, slow rate, and chin tuck, meds with puree were recommended to enhance pt swallow safety. Recommeded "pt remain on current diet of dys II/thin" however most current diet recommendation was from [facility] in March 2021 recommending soft solids(refrain from dry or crumbly food) and thin liquids. Pt was on this diet prior to most current MBSS at [facility] 11-16-20, therefore SLP recommends this soft food diet recommended from the MBSS at [facility] Today, pt used chin tuck incorrectly so SLP showed pt how to correctly utilize; pt was independent by session end. Pt stated she had been completing HEP x2-3/week however required min-mod A occasionally for proper procedure for HEP provided at last d/c from this facility. Pt unable to perform gauze/tongue retraction exercise correctly with mod-max A so this will be done in clinic with SLP to begin hopefully to progress to pt performing on her own eventually. Mother will require refreshing on swallow precautions and possibly procedure for HEP as well. Pt will require skilled ST for at least 4-6 weeks to again master HEP and become more independent with swallow precautions with POs.    Speech Therapy Frequency 2x / week    Duration 8 weeks   or 17 total visits   Treatment/Interventions Aspiration precaution training;Pharyngeal strengthening exercises;Diet toleration management by SLP;Trials of upgraded texture/liquids;Internal/external aids;Environmental controls;SLP instruction and  feedback;Compensatory techniques;Patient/family education    Potential to Achieve Goals Good    Potential Considerations Ability to learn/carryover information   cognitive deficits   Consulted and Agree with Plan of Care Patient;Family member/caregiver    Family Member Consulted mother           Patient will benefit from skilled therapeutic intervention in order to improve the following deficits and impairments:   Dysphagia, oropharyngeal phase    Problem List Patient Active Problem List  Diagnosis Date Noted  . Asthma with status asthmaticus 07/13/2013  . Hypothyroidism 07/13/2013  . Cerebral palsy (HCC) 07/13/2013  . Schizo affective schizophrenia (HCC) 07/13/2013  . 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 Anxiety 07/13/2013  . Status asthmaticus 07/13/2013    [facility] ,MS, CCC-SLP  11/25/2020, 7:37 PM  [facility] [address] [place], [place], [address] Phone: [phone]   Fax:  [phone]  Name: Farris Blash MRN: [id number] Date of Birth: 03-11-1971

## 2020-11-26 ENCOUNTER — Ambulatory Visit: Payer: Medicare Other

## 2020-11-26 ENCOUNTER — Other Ambulatory Visit: Payer: Self-pay

## 2020-11-26 DIAGNOSIS — R1312 Dysphagia, oropharyngeal phase: Secondary | ICD-10-CM | POA: Diagnosis not present

## 2020-11-27 NOTE — Therapy (Signed)
[facility] [address] [place], [place], [address] Phone: [phone]   Fax:  [phone]  Speech Language Pathology Treatment  Patient Details  Name: Angela Nicholson MRN: [id number] Date of Birth: 08-03-1970 Referring Provider (SLP): Delano Metz, MD   Encounter Date: 11/26/2020   End of Session - 11/27/20 0010    Visit Number 2    Number of Visits 10    Date for SLP Re-Evaluation 01/22/21    SLP Start Time 1403    SLP Stop Time  1434    SLP Time Calculation (min) 31 min    Activity Tolerance Patient tolerated treatment well           Past Medical History:  Diagnosis Date  . Asthma   . Barrett esophagus   . Deaf   . GERD (gastroesophageal reflux disease)   . Schizo affective schizophrenia (HCC)     No past surgical history on file.  There were no vitals filed for this visit.   Subjective Assessment - 11/27/20 0006    Subjective Pt eager to get to ST room.    Patient is accompained by: Family member;Interpreter                 ADULT SLP TREATMENT - 11/27/20 0001      General Information   Behavior/Cognition Alert;Cooperative;Pleasant mood;Impulsive;Requires cueing;Hard of hearing      Treatment Provided   Treatment provided Dysphagia      Dysphagia Treatment   Temperature Spikes Noted No    Respiratory Status Room air    Oral Cavity - Dentition Adequate natural dentition    Treatment Methods Therapeutic exercise;Compensation strategy training;Patient/caregiver education;Skilled observation    Patient observed directly with PO's No    Other treatment/comments SLP assisted pt with procedure with lingual isometrics with gauze pull. With min A including model from SLP pt was independent. SLP reviewed other exercises with pt and she req'd rare min A. SLP reitereated that she complete until 01-21-21 every day BID, then x3/week after that time. She acknowledged understanding of this. SLP also  reviewed her swallow precautions with [place] with pt telling SLP each of her precautions with independence. SLP asked pt to bring some POs next session so SLP can observe pt with POs.      Assessment / Recommendations / Plan   Plan Continue with current plan of care      Progression Toward Goals   Progression toward goals Progressing toward goals            SLP Education - 11/27/20 0009    Education Details procedure for HEP, frequency for HEP, precautions for POs, bring POs next time    Person(s) Educated Patient;Parent(s)    Methods Explanation;Demonstration;Verbal cues;Handout    Comprehension Verbalized understanding;Returned demonstration;Verbal cues required;Need further instruction            SLP Short Term Goals - 11/27/20 0011      SLP SHORT TERM GOAL #1   Title pt will demo swallowing HEP with rare min A over two sessions    Time 3    Period Weeks    Status On-going      SLP SHORT TERM GOAL #2   Title pt will demo swallow precautions with POs with rare min-mod A in 2 sessions,    Time 3    Period Weeks    Status On-going            SLP Long Term Goals -  11/27/20 0011      SLP LONG TERM GOAL #1   Title pt will complete HEP independently over three sessions    Time 6    Period Weeks   or 13 total visits, for all LTGs   Status On-going      SLP LONG TERM GOAL #2   Title pt will follow swallow precautions with rare min A in 3 sessions    Time 6    Period Weeks    Status On-going      SLP LONG TERM GOAL #3   Title pt will use chin tuck correctly in 15/15 swallows in 3 sessions    Time 6    Period Weeks    Status On-going            Plan - 11/27/20 0011    Clinical Impression Statement Makeya presents today with mild oropharyngeal dysphagia as ID'd by modified (MBSS) 11-16-20 with silent aspiration with thin and nectar alleviated with chin tuck. Imprecise timing of swallow was main reason for aspiration. Small bites/sips, slow rate, and chin  tuck, meds with puree were recommended to enhance pt swallow safety. Recommeded "pt remain on current diet of dys II/thin" however most current diet recommendation was from [facility] in March 2021 recommending soft solids(refrain from dry or crumbly food) and thin liquids. Pt was on this diet prior to most current MBSS at [facility] 11-16-20, therefore SLP recommends this soft food diet recommended from the MBSS at [facility]. Today, pt used chin tuck incorrectly so SLP showed pt how to correctly utilize; pt was independent by session end. Pt stated she had been completing HEP x2-3/week however required min-mod A occasionally for proper procedure for HEP provided at last d/c from this facility. Pt unable to perform gauze/tongue retraction exercise correctly with mod-max A so this will be done in clinic with SLP to begin hopefully to progress to pt performing on her own eventually. Mother will require refreshing on swallow precautions and possibly procedure for HEP as well. Pt will require skilled ST for at least 4-6 weeks to again master HEP and become more independent with swallow precautions with POs.    Speech Therapy Frequency 2x / week    Duration 8 weeks   or 17 total visits   Treatment/Interventions Aspiration precaution training;Pharyngeal strengthening exercises;Diet toleration management by SLP;Trials of upgraded texture/liquids;Internal/external aids;Environmental controls;SLP instruction and feedback;Compensatory techniques;Patient/family education    Potential to Achieve Goals Good    Potential Considerations Ability to learn/carryover information   cognitive deficits   Consulted and Agree with Plan of Care Patient;Family member/caregiver    Family Member Consulted mother           Patient will benefit from skilled therapeutic intervention in order to improve the following deficits and impairments:   Dysphagia, oropharyngeal phase    Problem List Patient Active Problem List   Diagnosis  Date Noted  . Asthma with status asthmaticus 07/13/2013  . Hypothyroidism 07/13/2013  . Cerebral palsy (HCC) 07/13/2013  . Schizo affective schizophrenia (HCC) 07/13/2013  . 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 Anxiety 07/13/2013  . Status asthmaticus 07/13/2013    [facility] ,MS, CCC-SLP  11/27/2020, 12:12 AM  [facility] [address] [place], [place], [address] Phone: [phone]   Fax:  [phone]   Name: Elexa Kivi MRN: [id number] Date of Birth: 1970-07-23

## 2020-11-28 ENCOUNTER — Ambulatory Visit: Payer: Medicare Other

## 2020-11-28 ENCOUNTER — Other Ambulatory Visit: Payer: Self-pay

## 2020-11-28 DIAGNOSIS — R1312 Dysphagia, oropharyngeal phase: Secondary | ICD-10-CM | POA: Diagnosis not present

## 2020-11-29 NOTE — Therapy (Signed)
[facility] [address] [place], [place], [address] Phone: [phone]   Fax:  [phone]  Speech Language Pathology Treatment  Patient Details  Name: Angela Nicholson MRN: [id number] Date of Birth: 09/10/1970 Referring Provider (SLP): Delano Metz, MD   Encounter Date: 11/28/2020   End of Session - 11/29/20 0845    Visit Number 3    Number of Visits 10    Date for SLP Re-Evaluation 01/22/21    SLP Start Time 0939    SLP Stop Time  1011    SLP Time Calculation (min) 32 min    Activity Tolerance Patient tolerated treatment well           Past Medical History:  Diagnosis Date  . Asthma   . Barrett esophagus   . Deaf   . GERD (gastroesophageal reflux disease)   . Schizo affective schizophrenia (HCC)     No past surgical history on file.  There were no vitals filed for this visit.          ADULT SLP TREATMENT - 11/29/20 0001      General Information   Behavior/Cognition Alert;Cooperative;Pleasant mood;Impulsive;Requires cueing;Hard of hearing      Treatment Provided   Treatment provided Dysphagia      Dysphagia Treatment   Respiratory Status Room air    Oral Cavity - Dentition Adequate natural dentition    Treatment Methods Therapeutic exercise;Compensation strategy training;Patient/caregiver education;Skilled observation    Patient observed directly with PO's No    Other treatment/comments SLP assisted pt with procedure with lingual isometrics with gauze pul - pt's procedure was WNL. She brought food today and so SLP assessed her with her precautions. Pt was not swalowing once additionally - SLP wrote out her precautions in sequence and provided to her, explainig the difference between a sip and a swallow with written cues. Pt stated, "Oh! I understand!" (via interpreter). She demonstrated correct precautions after this. Other procedure for HEP was WNL.SLP reiterated pt needs to follow  precauations or she will likely aspirate - and during the modified (MBSS) pt silently aspirated so she was not always aware of the aspiration.      Assessment / Recommendations / Plan   Plan Continue with current plan of care      Progression Toward Goals   Progression toward goals Progressing toward goals            SLP Education - 11/29/20 0845    Education Details sequence of her precautions during a meal    Person(s) Educated Patient;Parent(s)    Methods Explanation;Demonstration;Verbal cues    Comprehension Returned demonstration;Verbalized understanding;Need further instruction;Verbal cues required            SLP Short Term Goals - 11/29/20 0848      SLP SHORT TERM GOAL #1   Title pt will demo swallowing HEP with rare min A over two sessions    Time 3    Period Weeks    Status On-going      SLP SHORT TERM GOAL #2   Title pt will demo swallow precautions with POs with rare min-mod A in 2 sessions,    Time 3    Period Weeks    Status On-going            SLP Long Term Goals - 11/29/20 0848      SLP LONG TERM GOAL #1   Title pt will complete HEP independently over three sessions    Time 6  Period Weeks   or 13 total visits, for all LTGs   Status On-going      SLP LONG TERM GOAL #2   Title pt will follow swallow precautions with rare min A in 3 sessions    Time 6    Period Weeks    Status On-going      SLP LONG TERM GOAL #3   Title pt will use chin tuck correctly in 15/15 swallows in 3 sessions    Time 6    Period Weeks    Status On-going            Plan - 11/29/20 0846    Clinical Impression Statement Angela Nicholson presents today with mild oropharyngeal dysphagia as ID'd by modified (MBSS) 11-16-20 with silent aspiration with thin and nectar alleviated with chin tuck. Imprecise timing of swallow was main reason for aspiration. Small bites/sips, slow rate, and chin tuck, meds with puree were recommended to enhance pt swallow safety. Today SLP added  additional swallow to her precautions as well.  Pt will require skilled ST for at least 4-6 weeks to again master HEP and become more independent with swallow precautions with POs.    Speech Therapy Frequency 2x / week    Duration 8 weeks   or 17 total visits   Treatment/Interventions Aspiration precaution training;Pharyngeal strengthening exercises;Diet toleration management by SLP;Trials of upgraded texture/liquids;Internal/external aids;Environmental controls;SLP instruction and feedback;Compensatory techniques;Patient/family education    Potential to Achieve Goals Good    Potential Considerations Ability to learn/carryover information   cognitive deficits   Consulted and Agree with Plan of Care Patient;Family member/caregiver    Family Member Consulted mother           Patient will benefit from skilled therapeutic intervention in order to improve the following deficits and impairments:   Dysphagia, oropharyngeal phase    Problem List Patient Active Problem List   Diagnosis Date Noted  . Asthma with status asthmaticus 07/13/2013  . Hypothyroidism 07/13/2013  . Cerebral palsy (HCC) 07/13/2013  . Schizo affective schizophrenia (HCC) 07/13/2013  . 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 Anxiety 07/13/2013  . Status asthmaticus 07/13/2013    [facility] ,MS, CCC-SLP  11/29/2020, 8:49 AM  [facility] [address] [place], [place], [address] Phone: [phone]   Fax:  [phone]   Name: Angela Nicholson MRN: [id number] Date of Birth: September 22, 1970

## 2020-11-29 NOTE — Patient Instructions (Signed)
SLP wrote out pt's precautions for her on a card she brought to ST with her today.

## 2020-12-03 ENCOUNTER — Ambulatory Visit: Payer: Medicare Other

## 2020-12-05 ENCOUNTER — Ambulatory Visit: Payer: Medicare Other

## 2020-12-10 ENCOUNTER — Other Ambulatory Visit: Payer: Self-pay

## 2020-12-10 ENCOUNTER — Ambulatory Visit: Payer: Medicare Other

## 2020-12-10 DIAGNOSIS — R1312 Dysphagia, oropharyngeal phase: Secondary | ICD-10-CM

## 2020-12-10 NOTE — Therapy (Signed)
[facility] [address] [place], [place], [address] Phone: [phone]   Fax:  [phone]  Speech Language Pathology Treatment  Patient Details  Name: Angela Nicholson MRN: [id number] Date of Birth: 1971/06/05 Referring Provider (SLP): Delano Metz, MD   Encounter Date: 12/10/2020   End of Session - 12/10/20 1714    Visit Number 4    Number of Visits 10    Date for SLP Re-Evaluation 01/22/21    SLP Start Time 0934    SLP Stop Time  1010    SLP Time Calculation (min) 36 min    Activity Tolerance Patient tolerated treatment well           Past Medical History:  Diagnosis Date  . Asthma   . Barrett esophagus   . Deaf   . GERD (gastroesophageal reflux disease)   . Schizo affective schizophrenia (HCC)     History reviewed. No pertinent surgical history.  There were no vitals filed for this visit.   Subjective Assessment - 12/10/20 0946    Subjective Mother reports pt coughing with snack in her room, consistently, at night.    Patient is accompained by: Family member;Interpreter    Currently in Pain? No/denies                 ADULT SLP TREATMENT - 12/10/20 1705      General Information   Behavior/Cognition Alert;Cooperative;Pleasant mood;Impulsive;Requires cueing;Hard of hearing      Treatment Provided   Treatment provided Dysphagia      Dysphagia Treatment   Temperature Spikes Noted No    Respiratory Status Room air    Oral Cavity - Dentition Adequate natural dentition    Treatment Methods Therapeutic exercise;Patient/caregiver education;Compensation strategy training    Patient observed directly with PO's No    Other treatment/comments Pt req'd min A with cough on on supersupraglottic (SS), due to hard of hearing/deaf status. Mother stated pt regularly coughing with snacks in her room, but not with meals at dining room table. SLP skillfuly ascertained that pt with POs, pt has distraction  in her room - "I watch movies and eat too". SLP suggested to pt to only eat snacks and meals at table, and explained rationale. Pt agreed she should eat at the table and not in her room. SLP then assessed pt with liquids (chin down frequency) and pt put her chin down with sips x4 with initial, more vague, cue. ("What do you do with your chin when you drink?")      Assessment / Recommendations / Plan   Plan Continue with current plan of care            SLP Education - 12/10/20 1714    Education Details rationale to eat food at table and not in room    Person(s) Educated Patient;Parent(s)    Methods Explanation    Comprehension Verbalized understanding            SLP Short Term Goals - 12/10/20 1716      SLP SHORT TERM GOAL #1   Title pt will demo swallowing HEP with rare min A over two sessions    Baseline 12-10-20    Time 2    Period Weeks    Status On-going      SLP SHORT TERM GOAL #2   Title pt will demo swallow precautions with POs with rare min-mod A in 2 sessions,    Time 2    Period Weeks  Status On-going            SLP Long Term Goals - 12/10/20 1716      SLP LONG TERM GOAL #1   Title pt will complete HEP independently over three sessions    Time 5    Period Weeks   or 13 total visits, for all LTGs   Status On-going      SLP LONG TERM GOAL #2   Title pt will follow swallow precautions with rare min A in 3 sessions    Time 5    Period Weeks    Status On-going      SLP LONG TERM GOAL #3   Title pt will use chin tuck correctly in 15/15 swallows in 3 sessions    Time 5    Period Weeks    Status On-going            Plan - 12/10/20 1715    Clinical Impression Statement Angela Nicholson presents today with mild oropharyngeal dysphagia as ID'd by modified (MBSS) 11-16-20 with silent aspiration with thin and nectar alleviated with chin tuck. Imprecise timing of swallow was main reason for aspiration. Small bites/sips, slow rate, and chin tuck, meds with puree were  recommended to enhance pt swallow safety.   Pt will require skilled ST for at least 4-6 weeks to again master HEP and become more independent with swallow precautions with POs.    Speech Therapy Frequency 2x / week    Duration 8 weeks   or 17 total visits   Treatment/Interventions Aspiration precaution training;Pharyngeal strengthening exercises;Diet toleration management by SLP;Trials of upgraded texture/liquids;Internal/external aids;Environmental controls;SLP instruction and feedback;Compensatory techniques;Patient/family education    Potential to Achieve Goals Good    Potential Considerations Ability to learn/carryover information   cognitive deficits   Consulted and Agree with Plan of Care Patient;Family member/caregiver    Family Member Consulted mother           Patient will benefit from skilled therapeutic intervention in order to improve the following deficits and impairments:   Dysphagia, oropharyngeal phase    Problem List Patient Active Problem List   Diagnosis Date Noted  . Asthma with status asthmaticus 07/13/2013  . Hypothyroidism 07/13/2013  . Cerebral palsy (HCC) 07/13/2013  . Schizo affective schizophrenia (HCC) 07/13/2013  . 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 Anxiety 07/13/2013  . Status asthmaticus 07/13/2013    [facility] ,MS, CCC-SLP  12/10/2020, 5:17 PM  [facility] [address] [place], [place], [address] Phone: [phone]   Fax:  [phone]   Name: Angela Nicholson MRN: [id number] Date of Birth: 1971/04/29

## 2020-12-10 NOTE — Patient Instructions (Signed)
   Eat all food at the table! This will keep you safer than eating in your room, where you are more distracted.

## 2020-12-19 ENCOUNTER — Ambulatory Visit: Payer: Medicare Other | Attending: Family Medicine

## 2020-12-19 DIAGNOSIS — R1312 Dysphagia, oropharyngeal phase: Secondary | ICD-10-CM | POA: Insufficient documentation

## 2020-12-19 NOTE — Therapy (Signed)
[facility] [address] [place], [place], [address] Phone: [phone]   Fax:  [phone]  Speech Language Pathology Treatment  Patient Details  Name: Angela Nicholson MRN: [id number] Date of Birth: [birth year] Referring Provider (SLP): Delano Metz, MD   Encounter Date: 12/19/2020   End of Session - 12/19/20 1352    Visit Number 5    Number of Visits 10    Date for SLP Re-Evaluation 01/22/21    SLP Start Time 0936    SLP Stop Time  1015    SLP Time Calculation (min) 39 min    Activity Tolerance Patient tolerated treatment well           Past Medical History:  Diagnosis Date  . Asthma   . Barrett esophagus   . Deaf   . GERD (gastroesophageal reflux disease)   . Schizo affective schizophrenia (HCC)     No past surgical history on file.  There were no vitals filed for this visit.          ADULT SLP TREATMENT - 12/19/20 0959      General Information   Behavior/Cognition Alert;Cooperative;Pleasant mood;Impulsive;Requires cueing;Hard of hearing      Treatment Provided   Treatment provided Dysphagia      Dysphagia Treatment   Temperature Spikes Noted No    Respiratory Status Room air    Oral Cavity - Dentition Adequate natural dentition    Treatment Methods Therapeutic exercise;Patient/caregiver education;Compensation strategy training    Patient observed directly with PO's Yes    Type of PO's observed Regular;Thin liquids    Other treatment/comments Mom states pt occasionally coughing at home during meals. Also states not always following precautions at home. SLP inquired pt about this and pt reiterated she is swallowing twice for each sip and bite, small sips and bites, is putting chin down with liquids, and remaining upright for 30 minutes after POs. SLP observed pt with POs today and once pt did not complete a full chin down posture otherwise was WNL. SLP reiterated strongly to pt that she  needs to follow precautions. Pt demonstrated understanding of rationale for this. No cough or throat clear today with POs. Pt was independent wiht HEP except with gauze/lingual pull exercise - pt req'd SLP visual cues and was independent by session end. This is peculiar as pt has been independent with this exercise in previous sessions this therapy course. ? pt not completing this exercise at home (?), but pt states she is doing all exercises.      Assessment / Recommendations / Plan   Plan Continue with current plan of care   liekly d/c next session or two     Dysphagia Recommendations   Diet recommendations Dysphagia 3 (mechanical soft);Regular;Thin liquid    Liquids provided via Straw    Medication Administration --   as tolerated   Supervision Intermittent supervision to cue for compensatory strategies    Compensations Slow rate;Small sips/bites;Follow solids with liquid;Multiple dry swallows after each bite/sip    Postural Changes and/or Swallow Maneuvers Chin tuck   with liquids     Progression Toward Goals   Progression toward goals Progressing toward goals            SLP Education - 12/19/20 1351    Education Details gauze/lingual pull exercises    Person(s) Educated Patient;Parent(s)    Methods Explanation;Demonstration;Verbal cues    Comprehension Verbalized understanding;Returned demonstration;Verbal cues required  SLP Short Term Goals - 12/19/20 1353      SLP SHORT TERM GOAL #1   Title pt will demo swallowing HEP with rare min A over two sessions    Baseline 12-10-20    Time 1    Period Weeks    Status On-going      SLP SHORT TERM GOAL #2   Title pt will demo swallow precautions with POs with rare min-mod A in 2 sessions,    Status Achieved            SLP Long Term Goals - 12/19/20 1353      SLP LONG TERM GOAL #1   Title pt will complete HEP independently over three sessions    Time 4    Period Weeks   or 13 total visits, for all LTGs   Status  On-going      SLP LONG TERM GOAL #2   Title pt will follow swallow precautions with rare min A in 3 sessions    Baseline 12-19-20    Time 4    Period Weeks    Status On-going      SLP LONG TERM GOAL #3   Title pt will use chin tuck correctly in 15/15 swallows in 3 sessions    Time 4    Period Weeks    Status On-going            Plan - 12/19/20 1352    Clinical Impression Statement Jameeka presents today with mild oropharyngeal dysphagia as ID'd by modified (MBSS) 11-16-20 with silent aspiration with thin and nectar alleviated with chin tuck. Imprecise timing of swallow was main reason for aspiration. Small bites/sips, slow rate, and chin tuck, meds with puree were recommended to enhance pt swallow safety.   Pt will require skilled ST for at least 1-2 more sessions to again master HEP and become more independent with swallow precautions with POs.    Speech Therapy Frequency 2x / week    Duration 8 weeks   or 17 total visits   Treatment/Interventions Aspiration precaution training;Pharyngeal strengthening exercises;Diet toleration management by SLP;Trials of upgraded texture/liquids;Internal/external aids;Environmental controls;SLP instruction and feedback;Compensatory techniques;Patient/family education    Potential to Achieve Goals Good    Potential Considerations Ability to learn/carryover information   cognitive deficits   Consulted and Agree with Plan of Care Patient;Family member/caregiver    Family Member Consulted mother           Patient will benefit from skilled therapeutic intervention in order to improve the following deficits and impairments:   Dysphagia, oropharyngeal phase    Problem List Patient Active Problem List   Diagnosis Date Noted  . Asthma with status asthmaticus 07/13/2013  . Hypothyroidism 07/13/2013  . Cerebral palsy (HCC) 07/13/2013  . Schizo affective schizophrenia (HCC) 07/13/2013  . 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 Anxiety 07/13/2013  . Status asthmaticus 07/13/2013    [facility] ,MS, CCC-SLP  12/19/2020, 1:54 PM  [facility] [address] [place], [place], [address] Phone: [phone]   Fax:  [phone]   Name: Emonie Espericueta MRN: [id number] Date of Birth: [birth year]

## 2021-01-07 ENCOUNTER — Ambulatory Visit: Payer: Medicare Other

## 2021-01-07 ENCOUNTER — Other Ambulatory Visit: Payer: Self-pay

## 2021-01-07 DIAGNOSIS — R1312 Dysphagia, oropharyngeal phase: Secondary | ICD-10-CM

## 2021-01-07 NOTE — Patient Instructions (Signed)
  Work on your exercises twice a day until you go to [place] then do them on Mondays, Wednesdays, and Fridays.

## 2021-01-07 NOTE — Therapy (Signed)
[facility] [address] [place], [place], [address] Phone: [phone]   Fax:  [phone]  Speech Language Pathology Treatment/Discharge Summary  Patient Details  Name: Angela Nicholson MRN: [id number] Date of Birth: 03-24-1971 Referring Provider (SLP): Ledora Bottcher, MD   Encounter Date: 01/07/2021   End of Session - 01/07/21 1126     Visit Number 6    Number of Visits 10    Date for SLP Re-Evaluation 01/22/21    SLP Start Time 40    SLP Stop Time  1050    SLP Time Calculation (min) 32 min    Activity Tolerance Patient tolerated treatment well             Past Medical History:  Diagnosis Date   Asthma    Barrett esophagus    Deaf    GERD (gastroesophageal reflux disease)    Schizo affective schizophrenia ([place])     No past surgical history on file.  There were no vitals filed for this visit.   SPEECH THERAPY DISCHARGE SUMMARY  Visits from Start of Care: 6  Current functional level related to goals / functional outcomes: See goals below. Pt should cont to perform HEP daily until mid-July and then should perform Mondays, Wednesdays, and Fridays.   Remaining deficits: Mild intermittent dysphagia s/sx.    Education / Equipment: Precautions for mitigating aspiration risk, details of HEP, need to minimize distractions during mealtimes.   Patient agrees to discharge. Patient goals were partially met. Patient is being discharged due to being pleased with the current functional level..      Subjective Assessment - 01/07/21 1031     Subjective (Cathy) "She's doing so much better!"    Patient is accompained by: Family member;Interpreter    Currently in Pain? No/denies                   ADULT SLP TREATMENT - 01/07/21 1031       General Information   Behavior/Cognition Alert;Cooperative;Pleasant mood;Impulsive;Requires cueing;Hard of hearing      Treatment Provided   Treatment  provided Dysphagia      Dysphagia Treatment   Temperature Spikes Noted No    Respiratory Status Room air    Oral Cavity - Dentition Adequate natural dentition    Treatment Methods Therapeutic exercise;Patient/caregiver education;Compensation strategy training    Patient observed directly with PO's Yes    Type of PO's observed Regular;Thin liquids    Liquids provided via Straw    Oral Phase Signs & Symptoms --   none noted   Pharyngeal Phase Signs & Symptoms Immediate cough   x1/16 sips due to consecutive sips (2 sips) with straw - SLP cued pt single sips and after this no difficulties   Other treatment/comments Mother stated she is not noticing pt coughing during meals at this time. Pt is much more consistent with chin down posture with liquids and minimizing distractions during mealtime since previous ST session. Min A with HEP ([place] to keep lingual protrusion for the entirety of the swallow). Pt performed WNL after cue. SLP told pt to complete HEP every day until mid July and then decr to Mondays, Wednesdays, and Fridays. Independent with chin tuck.      Assessment / Recommendations / Plan   Plan Discharge SLP treatment due to (comment)   pt able to maintain swallow precautions, able to maintain HEP     Dysphagia Recommendations   Diet recommendations Regular;Dysphagia 3 (mechanical soft);Thin liquid  Liquids provided via Straw    Medication Administration --   as tolerated   Supervision Intermittent supervision to cue for compensatory strategies    Compensations Slow rate;Small sips/bites;Follow solids with liquid;Multiple dry swallows after each bite/sip    Postural Changes and/or Swallow Maneuvers Chin tuck      Progression Toward Goals   Progression toward goals --   d/c day- see goals             SLP Education - 01/07/21 1125     Education Details when to pare down frequency of HEP, protrude tongue through the swallow for [employer]) Educated Patient;Parent(s)     Methods Explanation;Demonstration    Comprehension Verbalized understanding;Returned demonstration              SLP Short Term Goals - 01/07/21 1127       SLP SHORT TERM GOAL #1   Title pt will demo swallowing HEP with rare min A over two sessions    Baseline 12-10-20    Status Partially Met      SLP SHORT TERM GOAL #2   Title pt will demo swallow precautions with POs with rare min-mod A in 2 sessions,    Status Achieved              SLP Long Term Goals - 01/07/21 1127       SLP LONG TERM GOAL #1   Title pt will complete HEP independently over three sessions    Period --   or 13 total visits, for all LTGs   Status Partially Met      SLP LONG TERM GOAL #2   Title pt will follow swallow precautions with rare min A in 3 sessions    Baseline 12-19-20, 01-07-21    Status Partially Met      SLP LONG TERM GOAL #3   Title pt will use chin tuck correctly in 15/15 swallows in 3 sessions    Baseline 01-07-21    Status Partially Met              Plan - 01/07/21 1126     Clinical Impression Statement Hadas presents today with mild oropharyngeal dysphagia as ID'd by modified (MBSS) 11-16-20 with silent aspiration with thin and nectar alleviated with chin tuck. Imprecise timing of swallow was main reason for aspiration. Small bites/sips, slow rate, and chin tuck, meds with puree were recommended to enhance pt swallow safety.  Pt will be discharged from skilled ST at this time. She agrees with d/c.    Treatment/Interventions Aspiration precaution training;Pharyngeal strengthening exercises;Diet toleration management by SLP;Trials of upgraded texture/liquids;Internal/external aids;Environmental controls;SLP instruction and feedback;Compensatory techniques;Patient/family education    Potential to Achieve Goals Good    Potential Considerations Ability to learn/carryover information   cognitive deficits   Consulted and Agree with Plan of Care Patient;Family member/caregiver     Family Member Consulted mother             Patient will benefit from skilled therapeutic intervention in order to improve the following deficits and impairments:   Dysphagia, oropharyngeal phase    Problem List Patient Active Problem List   Diagnosis Date Noted   Asthma with status asthmaticus 07/13/2013   Hypothyroidism 07/13/2013   Cerebral palsy ([place]) 07/13/2013   Schizo affective schizophrenia ([place]) 07/13/2013   Deafness or hearing loss 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 [place], [place], [address] Phone: [phone]   Fax:  [phone]   Name: Jenilyn Magana MRN: [id number] Date of Birth: 19-Jun-1971

## 2021-05-26 ENCOUNTER — Emergency Department (HOSPITAL_COMMUNITY): Payer: Medicare Other

## 2021-05-26 ENCOUNTER — Inpatient Hospital Stay (HOSPITAL_COMMUNITY)
Admission: EM | Admit: 2021-05-26 | Discharge: 2021-05-28 | DRG: 177 | Disposition: A | Discharge status: Home / Self Care | Payer: Medicare Other | Attending: Internal Medicine | Admitting: Internal Medicine

## 2021-05-26 ENCOUNTER — Other Ambulatory Visit: Payer: Self-pay

## 2021-05-26 DIAGNOSIS — Z79899 Other long term (current) drug therapy: Secondary | ICD-10-CM

## 2021-05-26 DIAGNOSIS — J9601 Acute respiratory failure with hypoxia: Secondary | ICD-10-CM | POA: Diagnosis present

## 2021-05-26 DIAGNOSIS — T17908A Unspecified foreign body in respiratory tract, part unspecified causing other injury, initial encounter: Secondary | ICD-10-CM | POA: Diagnosis present

## 2021-05-26 DIAGNOSIS — Z20822 Contact with and (suspected) exposure to covid-19: Secondary | ICD-10-CM | POA: Diagnosis present

## 2021-05-26 DIAGNOSIS — K219 Gastro-esophageal reflux disease without esophagitis: Secondary | ICD-10-CM | POA: Diagnosis present

## 2021-05-26 DIAGNOSIS — F259 Schizoaffective disorder, unspecified: Secondary | ICD-10-CM | POA: Diagnosis present

## 2021-05-26 DIAGNOSIS — J690 Pneumonitis due to inhalation of food and vomit: Secondary | ICD-10-CM | POA: Diagnosis not present

## 2021-05-26 DIAGNOSIS — J45909 Unspecified asthma, uncomplicated: Secondary | ICD-10-CM | POA: Diagnosis present

## 2021-05-26 DIAGNOSIS — E039 Hypothyroidism, unspecified: Secondary | ICD-10-CM | POA: Diagnosis present

## 2021-05-26 DIAGNOSIS — F419 Anxiety disorder, unspecified: Secondary | ICD-10-CM | POA: Diagnosis present

## 2021-05-26 DIAGNOSIS — R0902 Hypoxemia: Secondary | ICD-10-CM | POA: Diagnosis present

## 2021-05-26 DIAGNOSIS — R4701 Aphasia: Secondary | ICD-10-CM | POA: Diagnosis present

## 2021-05-26 DIAGNOSIS — Z880 Allergy status to penicillin: Secondary | ICD-10-CM

## 2021-05-26 DIAGNOSIS — E871 Hypo-osmolality and hyponatremia: Secondary | ICD-10-CM | POA: Diagnosis present

## 2021-05-26 DIAGNOSIS — G809 Cerebral palsy, unspecified: Secondary | ICD-10-CM | POA: Diagnosis present

## 2021-05-26 DIAGNOSIS — K2270 Barrett's esophagus without dysplasia: Secondary | ICD-10-CM | POA: Diagnosis present

## 2021-05-26 DIAGNOSIS — Z79890 Hormone replacement therapy (postmenopausal): Secondary | ICD-10-CM

## 2021-05-26 DIAGNOSIS — H913 Deaf nonspeaking, not elsewhere classified: Secondary | ICD-10-CM | POA: Diagnosis present

## 2021-05-26 DIAGNOSIS — Z882 Allergy status to sulfonamides status: Secondary | ICD-10-CM

## 2021-05-26 LAB — BASIC METABOLIC PANEL
Anion gap: 6 (ref 5–15)
BUN: 17 mg/dL (ref 6–20)
CO2: 28 mmol/L (ref 22–32)
Calcium: 8.4 mg/dL — ABNORMAL LOW (ref 8.9–10.3)
Chloride: 98 mmol/L (ref 98–111)
Creatinine, Ser: 0.65 mg/dL (ref 0.44–1.00)
GFR, Estimated: >60 mL/min (ref >60)
Glucose, Bld: 93 mg/dL (ref 70–99)
Potassium: 4.2 mmol/L (ref 3.5–5.1)
Sodium: 132 mmol/L — ABNORMAL LOW (ref 135–145)

## 2021-05-26 LAB — CBC WITH DIFFERENTIAL/PLATELET
Abs Immature Granulocytes: 0.03 10*3/uL (ref 0.00–0.07)
Basophils Absolute: 0 10*3/uL (ref 0.0–0.1)
Basophils Relative: 0 %
Eosinophils Absolute: 0 10*3/uL (ref 0.0–0.5)
Eosinophils Relative: 0 %
HCT: 37.7 % (ref 36.0–46.0)
Hemoglobin: 12.3 g/dL (ref 12.0–15.0)
Immature Granulocytes: 0 %
Lymphocytes Relative: 16 %
Lymphs Abs: 2 10*3/uL (ref 0.7–4.0)
MCH: 27.3 pg (ref 26.0–34.0)
MCHC: 32.6 g/dL (ref 30.0–36.0)
MCV: 83.8 fL (ref 80.0–100.0)
Monocytes Absolute: 0.7 10*3/uL (ref 0.1–1.0)
Monocytes Relative: 5 %
Neutro Abs: 9.7 10*3/uL — ABNORMAL HIGH (ref 1.7–7.7)
Neutrophils Relative %: 79 %
Platelets: 355 10*3/uL (ref 150–400)
RBC: 4.5 MIL/uL (ref 3.87–5.11)
RDW: 14.4 % (ref 11.5–15.5)
WBC: 12.4 10*3/uL — ABNORMAL HIGH (ref 4.0–10.5)
nRBC: 0 % (ref 0.0–0.2)

## 2021-05-26 LAB — RESP PANEL BY RT-PCR (FLU A&B, COVID) ARPGX2
Influenza A by PCR: NEGATIVE
Influenza B by PCR: NEGATIVE
SARS Coronavirus 2 by RT PCR: NEGATIVE

## 2021-05-26 MED ORDER — DIVALPROEX SODIUM 500 MG PO DR TAB
500.0000 mg | DELAYED_RELEASE_TABLET | Freq: Two times a day (BID) | ORAL | Status: DC
  Administered 2021-05-26 – 2021-05-28 (×4): 500 mg via ORAL
  Filled 2021-05-26: qty 1
  Filled 2021-05-26: qty 2
  Filled 2021-05-26 (×3): qty 1
  Filled 2021-05-26 (×3): qty 2
  Filled 2021-05-26: qty 1

## 2021-05-26 MED ORDER — SODIUM CHLORIDE 0.9 % IV SOLN
1.0000 g | INTRAVENOUS | Status: DC
  Administered 2021-05-27 – 2021-05-28 (×2): 1 g via INTRAVENOUS
  Filled 2021-05-26 (×2): qty 10

## 2021-05-26 MED ORDER — ROPINIROLE HCL 1 MG PO TABS
1.0000 mg | ORAL_TABLET | Freq: Two times a day (BID) | ORAL | Status: DC
  Administered 2021-05-26 – 2021-05-28 (×4): 1 mg via ORAL
  Filled 2021-05-26 (×4): qty 1

## 2021-05-26 MED ORDER — ONDANSETRON HCL 4 MG PO TABS: 4.0000 mg | ORAL_TABLET | Freq: Four times a day (QID) | ORAL | Status: DC | PRN

## 2021-05-26 MED ORDER — ONDANSETRON HCL 4 MG/2ML IJ SOLN: 4.0000 mg | Freq: Four times a day (QID) | INTRAMUSCULAR | Status: DC | PRN

## 2021-05-26 MED ORDER — LEVOTHYROXINE SODIUM 50 MCG PO TABS
150.0000 ug | ORAL_TABLET | Freq: Every day | ORAL | Status: DC
  Administered 2021-05-26 – 2021-05-28 (×3): 150 ug via ORAL
  Filled 2021-05-26 (×3): qty 1

## 2021-05-26 MED ORDER — METRONIDAZOLE 500 MG/100ML IV SOLN
500.0000 mg | Freq: Once | INTRAVENOUS | Status: AC
  Administered 2021-05-26: 500 mg via INTRAVENOUS
  Filled 2021-05-26: qty 100

## 2021-05-26 MED ORDER — MONTELUKAST SODIUM 10 MG PO TABS
10.0000 mg | ORAL_TABLET | Freq: Every evening | ORAL | Status: DC
  Administered 2021-05-26 – 2021-05-27 (×2): 10 mg via ORAL
  Filled 2021-05-26 (×2): qty 1

## 2021-05-26 MED ORDER — METHYLPREDNISOLONE SODIUM SUCC 40 MG IJ SOLR
40.0000 mg | Freq: Once | INTRAMUSCULAR | Status: AC
  Administered 2021-05-26: 40 mg via INTRAVENOUS
  Filled 2021-05-26: qty 1

## 2021-05-26 MED ORDER — ACETAMINOPHEN 650 MG RE SUPP: 650.0000 mg | Freq: Four times a day (QID) | RECTAL | Status: DC | PRN

## 2021-05-26 MED ORDER — SODIUM CHLORIDE 0.9 % IV SOLN: INTRAVENOUS | Status: DC

## 2021-05-26 MED ORDER — IPRATROPIUM-ALBUTEROL 0.5-2.5 (3) MG/3ML IN SOLN: 3.0000 mL | RESPIRATORY_TRACT | Status: DC | PRN

## 2021-05-26 MED ORDER — METRONIDAZOLE 500 MG/100ML IV SOLN
500.0000 mg | Freq: Two times a day (BID) | INTRAVENOUS | Status: DC
  Administered 2021-05-26 – 2021-05-28 (×4): 500 mg via INTRAVENOUS
  Filled 2021-05-26 (×4): qty 100

## 2021-05-26 MED ORDER — MIRTAZAPINE 7.5 MG PO TABS
45.0000 mg | ORAL_TABLET | Freq: Every day | ORAL | Status: DC
  Administered 2021-05-26 – 2021-05-27 (×2): 45 mg via ORAL
  Filled 2021-05-26 (×3): qty 2

## 2021-05-26 MED ORDER — ENOXAPARIN SODIUM 30 MG/0.3ML IJ SOSY
30.0000 mg | PREFILLED_SYRINGE | INTRAMUSCULAR | Status: DC
  Administered 2021-05-26 – 2021-05-27 (×2): 30 mg via SUBCUTANEOUS
  Filled 2021-05-26 (×2): qty 0.3

## 2021-05-26 MED ORDER — PANTOPRAZOLE SODIUM 40 MG PO TBEC
40.0000 mg | DELAYED_RELEASE_TABLET | Freq: Every day | ORAL | Status: DC
  Administered 2021-05-27 – 2021-05-28 (×2): 40 mg via ORAL
  Filled 2021-05-26 (×2): qty 1

## 2021-05-26 MED ORDER — OLANZAPINE 10 MG PO TABS
20.0000 mg | ORAL_TABLET | Freq: Every day | ORAL | Status: DC
  Administered 2021-05-26 – 2021-05-27 (×2): 20 mg via ORAL
  Filled 2021-05-26 (×3): qty 2

## 2021-05-26 MED ORDER — NALTREXONE HCL 50 MG PO TABS
100.0000 mg | ORAL_TABLET | Freq: Two times a day (BID) | ORAL | Status: DC
  Administered 2021-05-26 – 2021-05-28 (×4): 100 mg via ORAL
  Filled 2021-05-26 (×5): qty 2

## 2021-05-26 MED ORDER — ATORVASTATIN CALCIUM 10 MG PO TABS
20.0000 mg | ORAL_TABLET | Freq: Every day | ORAL | Status: DC
  Administered 2021-05-26 – 2021-05-28 (×3): 20 mg via ORAL
  Filled 2021-05-26 (×3): qty 2

## 2021-05-26 MED ORDER — ACETAMINOPHEN 325 MG PO TABS
650.0000 mg | ORAL_TABLET | Freq: Four times a day (QID) | ORAL | Status: DC | PRN
  Administered 2021-05-27 – 2021-05-28 (×2): 650 mg via ORAL
  Filled 2021-05-26 (×2): qty 2

## 2021-05-26 MED ORDER — SODIUM CHLORIDE 0.9 % IV SOLN
2.0000 g | Freq: Once | INTRAVENOUS | Status: AC
  Administered 2021-05-26: 2 g via INTRAVENOUS
  Filled 2021-05-26: qty 20

## 2021-05-26 MED ORDER — LORATADINE 10 MG PO TABS
10.0000 mg | ORAL_TABLET | Freq: Every day | ORAL | Status: DC
  Administered 2021-05-27 – 2021-05-28 (×2): 10 mg via ORAL
  Filled 2021-05-26 (×2): qty 1

## 2021-05-26 MED ORDER — SODIUM CHLORIDE 0.9 % IV SOLN: 2.0000 g | INTRAVENOUS | Status: DC

## 2021-05-26 NOTE — ED Notes (Signed)
Lowered O2 to 2L on [place]. Patient tolerating well.

## 2021-05-26 NOTE — Progress Notes (Signed)
Patient's vital signs checked, SpO2.  Oxygen via nasal cannula discontinued.  Patient oriented to room, call bell/TV remote via tablet ASL interpreter.  Mother Okey Regal at bedside.  Dinner ordered.  Cardiac monitoring set up.  Patient independent in room.  Normal saline started in PIV in right AC at 100 mL/hr.  Mother instructed that she may return home for pt belongings and come back through the ED entrance to stay the night with pt.  Bradd Burner, RN

## 2021-05-26 NOTE — ED Notes (Signed)
Lowered O2 flow rate to 4L. Tolerating well.

## 2021-05-26 NOTE — H&P (Signed)
History and Physical    Angela Nicholson [id number] DOB: [birth year] DOA: 05/26/2021  PCP: Curly Rim, MD  Patient coming from: Home.  I have personally briefly reviewed patient's old medical records in [place]  Chief Complaint: Choked while eating and negative.  History taken with the help of sign language interpreter. HPI: Angela Nicholson is a 50 y.o. female with medical history significant of asthma, GERD, Barret esophagus, deaf mutism, cerebral palsy, schizoaffective disorder who is coming to the emergency department due to choking while eating an egg which was followed by cough, rhonchi and hypoxia.  She denied headache, rhinorrhea, stated her throat with mildly sore.  She has been having a mostly nonproductive cough.  No she has pleuritic chest pain, but no palpitations, dizziness, diaphoresis, PND, orthopnea or pitting edema of the lower extremities.  Denied appetite changes, abdominal pain, nausea, emesis, diarrhea, constipation, melena or hematochezia.  No dysuria, frequency or hematuria.  No polyuria, polydipsia, polyphagia or blurred vision.  The patient's mother stated that she has had previous issues with swallowing.  However, her mother and the patient stated that she was very hungry and would like to eat.  ED Course: Initial vital signs were 97.5 F, pulse 84, respiration 20, BP 123/81 mmHg O2 sat 87%.  She received supplemental oxygen, bronchodilators, ceftriaxone and metronidazole.  Lab work: CBC showed a white count of 12.4 with 79% neutrophils, hemoglobin 12.3 g/dL platelets 355.  Sodium was 132 and calcium 8.4 mg/dL.  The rest of the BMP results were unremarkable.  Imaging: Chest radiograph did not show any active disease.  Please see images and full radiology report for further details.  Review of Systems: As per HPI otherwise all other systems reviewed and are negative.  Past Medical History:  Diagnosis Date   Asthma    Barrett esophagus    Deaf     GERD (gastroesophageal reflux disease)    Schizo affective schizophrenia ([place])    No past surgical history on file.  Social History  reports that she has never smoked. She has never used smokeless tobacco. She reports that she does not drink alcohol and does not use drugs.  Allergies  Allergen Reactions   Penicillins Rash   Sulfa Antibiotics Rash   Family medical history Several members with hypertension.  Prior to Admission medications   Medication Sig Start Date End Date Taking? Authorizing Provider  acetaminophen (TYLENOL) 500 MG tablet Take 500 mg by mouth every 6 (six) hours as needed (cramps/pain).   Yes [provider]  atorvastatin (LIPITOR) 20 MG tablet Take 20 mg by mouth See admin instructions. Take one tablet (20 mg) by mouth daily at 3pm   Yes [provider]  Calcium Carbonate-Vitamin D (CALCIUM-D PO) Take 1 Dose by mouth every morning. Liquid - Calcium 1500 mg/ Vitamin D3 600 units   Yes [provider]  divalproex (DEPAKOTE) 250 MG DR tablet Take 500 mg by mouth 2 (two) times daily. 8am and 8pm 03/12/21  Yes [provider]  ipratropium-albuterol (DUONEB) 0.5-2.5 (3) MG/3ML SOLN Take 3 mLs by nebulization every 4 (four) hours as needed (shortness of breath/wheezing). 04/11/21  Yes [provider]  levocetirizine (XYZAL) 5 MG tablet Take 5 mg by mouth See admin instructions. Take one tablet (5 mg) by mouth every morning or take one tablet of loratadine 10 mg every morning (alternating monthly) 05/15/21  Yes [provider]  levothyroxine (SYNTHROID) 150 MCG tablet Take 150 mcg by mouth See admin instructions. Take  one tablet (150 mcg) by mouth daily at 3pm 04/11/21  Yes [provider]  loratadine (CLARITIN) 10 MG tablet Take 10 mg by mouth See admin instructions. Take one tablet (10 mg) by mouth every morning or take one tablet of levocetirizine 5 mg every morning (alternating monthly) 03/16/21  Yes [provider]  mirtazapine (REMERON) 45 MG tablet Take 45 mg by mouth at bedtime. 04/03/21  Yes [provider]  montelukast (SINGULAIR) 10 MG tablet Take 10 mg by mouth every evening. 8pm   Yes [provider]  naltrexone (DEPADE) 50 MG tablet Take 100 mg by mouth 2 (two) times daily. 8am and 8pm   Yes [provider]  norethindrone-ethinyl estradiol (NORTREL 7/7/7) 0.5/0.75/1-35 MG-MCG tablet Take 1 tablet by mouth See admin instructions. Take one tablet by mouth daily at 3pm   Yes [provider]  OLANZapine (ZYPREXA) 20 MG tablet Take 20 mg by mouth at bedtime.   Yes [provider]  omeprazole (PRILOSEC OTC) 20 MG tablet Take 20 mg by mouth every morning.   Yes [provider]  rOPINIRole (REQUIP) 1 MG tablet Take 1 mg by mouth 2 (two) times daily. 8am and 8pm   Yes [provider]  doxycycline (VIBRA-TABS) 100 MG tablet Take 100 mg by mouth 2 (two) times daily. Patient not taking: No sig reported 05/15/21   [provider]   Physical Exam: Vitals:   05/26/21 1450 05/26/21 1455 05/26/21 1500 05/26/21 1730  BP:  (!) 171/84 (!) 171/105 (!) 148/77  Pulse: 85 91 86 86  Resp:  18 18 16   Temp: 97.7 F (36.5 C)     TempSrc: Oral     SpO2: 100% (!) 87% 100% 100%   Constitutional: NAD, calm, comfortable Eyes: PERRL, lids and conjunctivae normal.  Mildly injected sclera. ENMT: Nasal cannula in place.  Mucous membranes are moist. Posterior pharynx clear of any exudate or lesions.Normal dentition.  Neck: normal, supple, no masses, no thyromegaly Respiratory: Bilateral rhonchi with mild wheezing, no crackles. Normal respiratory effort. No accessory muscle use.  Cardiovascular: Regular rate and rhythm, no murmurs / rubs / gallops. No extremity edema. 2+ pedal pulses. No carotid bruits.  Abdomen: no tenderness, no masses palpated. No hepatosplenomegaly. Bowel sounds positive.  Musculoskeletal: Moderate to severe generalized  weakness.  No clubbing / cyanosis.  Good ROM, no contractures. Skin: no rashes, lesions, ulcers on very limited dermatological examination. Neurologic: CN 2-12 grossly intact. Sensation intact, DTR normal.  Psychiatric: Normal judgment and insight. Alert and oriented x 3. Normal mood.   Labs on Admission: I have personally reviewed following labs and imaging studies  CBC: Recent Labs  Lab 05/26/21 1428  WBC 12.4*  NEUTROABS 9.7*  HGB 12.3  HCT 37.7  MCV 83.8  PLT 355    Basic Metabolic Panel: Recent Labs  Lab 05/26/21 1428  NA 132*  K 4.2  CL 98  CO2 28  GLUCOSE 93  BUN 17  CREATININE 0.65  CALCIUM 8.4*   GFR: CrCl cannot be calculated (Unknown ideal weight.).  Liver Function Tests: No results for input(s): AST, ALT, ALKPHOS, BILITOT, PROT, ALBUMIN in the last 168 hours.  Radiological Exams on Admission: DG Chest 2 View  Result Date: 05/26/2021 CLINICAL DATA:  Aspiration. EXAM: CHEST - 2 VIEW COMPARISON:  None. FINDINGS: The heart size and mediastinal contours are within normal limits. Both lungs are clear. The visualized skeletal structures are unremarkable. IMPRESSION: No active cardiopulmonary disease. Electronically Signed   By:  Fidela Salisbury M.D.   On: 05/26/2021 11:10    EKG: Independently reviewed.   Assessment/Plan Principal Problem:   Aspiration into airway, initial encounter   History of asthma Observation/telemetry. Continue supplemental oxygen. Continue bronchodilators. Continue ceftriaxone 1 g every 24 hours. Continue metronidazole 500 mg every 12 hours. Solu-Medrol 40 mg IVP x1. Aspiration precautions.  Active Problems:   Hyponatremia Continue IV fluids. Follow-up sodium level in the morning.    Hypocalcemia Check calcium level in AM.    Hypothyroidism Continue levothyroxine 150 mcg p.o. daily.    Anxiety   Schizo affective schizophrenia (HCC) Continue Remeron 45 mg p.o. at bedtime. Continue Zyprexa 20 mg p.o. bedtime.     Barrett esophagus Continue PPI.    Deafmutism   Cerebral palsy ([place]) Supportive care. Communicates well with sign Ecologist.   DVT prophylaxis: Lovenox SQ. Code Status:   Full code. Family Communication:  Her mother was present in the ED room.  Patient is from:  Home.  Anticipated DC to:  Home.  Anticipated DC date:  05/28/2021.  Anticipated DC barriers: Clinical status.  Consults called:   Admission status:  Observation/telemetry.   Severity of Illness: High severity after presenting with hypoxia in the 80s after choking and aspirating into airway while eating an egg for breakfast in the morning.  The patient will remain in the hospital to be treated for aspiration pneumonia.  Reubin Milan MD [facility]  How to contact the [facility] Attending or Consulting provider [place] or covering provider during after hours [place], for this patient?   Check the care team in [facility] and look for a) attending/consulting [facility] provider listed and b) the [facility] team listed Log into [email or web address] and use [place]'s universal password to access. If you do not have the password, please contact the hospital operator. Locate the [facility] provider you are looking for under [facility] and page to a number that you can be directly reached. If you still have difficulty reaching the provider, please page the [facility] (Director on Call) for the Hospitalists listed on amion for assistance.  05/26/2021, 5:33 PM   This document was prepared using Dragon voice recognition software and may contain some unintended transcription errors.

## 2021-05-26 NOTE — ED Notes (Signed)
Pt ambulated to the bathroom and back on 2L O2 and remained in the 90s.

## 2021-05-26 NOTE — ED Provider Notes (Signed)
I provided a substantive portion of the care of this patient.  I personally performed the entirety of the medical decision making for this encounter.      Patient here after choking on eggs.  She is breathing comfortably at this time.  Will check chest x-ray   Lorre Nick, MD 05/26/21 1056

## 2021-05-26 NOTE — ED Triage Notes (Signed)
[name]. Per EMS patients mother witnessed her choking on a boiled egg. EMS noted adventitious sounds in bronchus.   Patient was satting at 87% when EMS arrived. Was put on [facility].  BP: 120/82 HR: 95

## 2021-05-26 NOTE — ED Provider Notes (Signed)
[name] Provider Note   CSN: [id number] Arrival date & time: 05/26/21  0909     History No chief complaint on file.  History provided by the patient, her mother, with the assistance of sign language interpreter, [name].  Angela Nicholson is a 50 y.o. female with history of cerebral palsy who presents with concern for possible aspiration.  Was eating her boilers morning when she began to choke.  According to her mother she her lips became blue and her face became very white and she was struggling to breathe.  Coughing excessively, became agitated and pulled all of her clothing off.  9 1 was called.  Patient subsequently coughed up a large amount of mucus and possibly piece of egg.  She was hypoxic to 87% when EMS arrived and was placed on 6 L nasal cannula brought to the emergency department.  At this time she states that she feels well with the oxygen in place but does continue to cough intermittently.  She is not productive in her cough.  I personally read this patient's medical record.  She is history of anxiety, asthma, cerebral palsy, congenital deafness.  On Prozac, Remeron, atorvastatin, Requip, Zyprexa at home.  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   OB History   No obstetric history on file.     No family history on  file.  Social History   Tobacco Use   Smoking status: Never   Smokeless tobacco: Never  Substance Use Topics   Alcohol use: No   Drug use: No    Home Medications Prior to Admission medications   Medication Sig Start Date End Date Taking? Authorizing Provider  acetaminophen (TYLENOL) 500 MG tablet Take 500 mg by mouth every 6 (six) hours as needed (cramps/pain).   Yes [provider]  atorvastatin (LIPITOR) 20 MG tablet Take 20 mg by mouth See admin instructions. Take one tablet (20 mg) by mouth daily at 3pm   Yes [provider]  Calcium Carbonate-Vitamin D (CALCIUM-D PO) Take 1 Dose by mouth every morning. Liquid - Calcium 1500 mg/ Vitamin D3 600 units   Yes [provider]  divalproex (DEPAKOTE) 250 MG DR tablet Take 500 mg by mouth 2 (two) times daily. 8am and 8pm 03/12/21  Yes [provider]  ipratropium-albuterol (DUONEB) 0.5-2.5 (3) MG/3ML SOLN Take 3 mLs by nebulization every 4 (four) hours as needed (shortness of breath/wheezing). 04/11/21  Yes [provider]  levocetirizine (XYZAL) 5 MG tablet Take 5 mg by mouth See admin instructions. Take one tablet (5 mg) by mouth every morning or take one tablet of loratadine 10 mg every morning (alternating monthly) 05/15/21  Yes [provider]  levothyroxine (SYNTHROID) 150 MCG tablet Take 150 mcg by mouth See admin instructions. Take one tablet (150 mcg) by mouth daily at  3pm 04/11/21  Yes [provider]  loratadine (CLARITIN) 10 MG tablet Take 10 mg by mouth See admin instructions. Take one tablet (10 mg) by mouth every morning or take one tablet of levocetirizine 5 mg every morning (alternating monthly) 03/16/21  Yes [provider]  mirtazapine (REMERON) 45 MG tablet Take 45 mg by mouth at bedtime. 04/03/21  Yes [provider]  montelukast (SINGULAIR) 10 MG tablet Take 10 mg by mouth every evening. 8pm   Yes [provider]  naltrexone (DEPADE) 50 MG  tablet Take 100 mg by mouth 2 (two) times daily. 8am and 8pm   Yes [provider]  norethindrone-ethinyl estradiol (NORTREL 7/7/7) 0.5/0.75/1-35 MG-MCG tablet Take 1 tablet by mouth See admin instructions. Take one tablet by mouth daily at 3pm   Yes [provider]  OLANZapine (ZYPREXA) 20 MG tablet Take 20 mg by mouth at bedtime.   Yes [provider]  omeprazole (PRILOSEC OTC) 20 MG tablet Take 20 mg by mouth every morning.   Yes [provider]  rOPINIRole (REQUIP) 1 MG tablet Take 1 mg by mouth 2 (two) times daily. 8am and 8pm   Yes [provider]  doxycycline (VIBRA-TABS) 100 MG tablet Take 100 mg by mouth 2 (two) times daily. Patient not taking: No sig reported 05/15/21   [provider]    Allergies    Penicillins and Sulfa antibiotics  Review of Systems   Review of Systems  Constitutional: Negative.   HENT: Negative.    Respiratory:  Positive for cough, choking and shortness of breath.   Cardiovascular: Negative.   Gastrointestinal: Negative.   Genitourinary: Negative.   Musculoskeletal: Negative.   Neurological: Negative.    Physical Exam Updated Vital Signs BP (!) 171/105   Pulse 86   Temp 97.7 F (36.5 C) (Oral)   Resp 18   SpO2 100%   Physical Exam Vitals and nursing note reviewed.  Constitutional:      Appearance: She is not ill-appearing or toxic-appearing.  HENT:     Head: Normocephalic and atraumatic.     Nose: Nose normal. No congestion.     Mouth/Throat:     Mouth: Mucous membranes are moist.     Pharynx: No oropharyngeal exudate or posterior oropharyngeal erythema.  Eyes:     General:        Right eye: No discharge.        Left eye: No discharge.     Extraocular Movements: Extraocular movements intact.     Conjunctiva/sclera: Conjunctivae normal.     Pupils: Pupils are equal, round, and reactive to light.  Cardiovascular:     Rate and Rhythm: Normal rate and regular rhythm.     Pulses: Normal  pulses.     Heart sounds: Normal heart sounds. No murmur heard. Pulmonary:     Effort: Pulmonary effort is normal. No tachypnea, bradypnea, accessory muscle usage, prolonged expiration or respiratory distress.     Breath sounds: Normal breath sounds. No wheezing or rales.     Comments: Exam complicated by patient's poor participation. On 3L O2 by [place]. Chest:     Chest wall: No mass, lacerations, deformity, swelling, tenderness, crepitus or edema.  Abdominal:     General: Bowel sounds are normal. There is no distension.     Palpations: Abdomen is soft.     Tenderness: There is no abdominal tenderness. There is no right CVA tenderness, left CVA tenderness, guarding or rebound.  Musculoskeletal:  General: No deformity.     Cervical back: Neck supple. No tenderness.  Lymphadenopathy:     Cervical: No cervical adenopathy.  Skin:    General: Skin is warm and dry.     Capillary Refill: Capillary refill takes less than 2 seconds.  Neurological:     General: No focal deficit present.     Mental Status: She is alert and oriented to person, place, and time. Mental status is at baseline.     Gait: Gait is intact. Gait normal.  Psychiatric:        Mood and Affect: Mood normal.    ED Results / Procedures / Treatments   Labs (all labs ordered are listed, but only abnormal results are displayed) Labs Reviewed  BASIC METABOLIC PANEL - Abnormal; Notable for the following components:      Result Value   Sodium 132 (*)    Calcium 8.4 (*)    All other components within normal limits  CBC WITH DIFFERENTIAL/PLATELET - Abnormal; Notable for the following components:   WBC 12.4 (*)    Neutro Abs 9.7 (*)    All other components within normal limits  RESP PANEL BY RT-PCR (FLU A&B, COVID) [id number]    EKG None  Radiology DG Chest 2 View  Result Date: 05/26/2021 CLINICAL DATA:  Aspiration. EXAM: CHEST - 2 VIEW COMPARISON:  None. FINDINGS: The heart size and mediastinal contours are within  normal limits. Both lungs are clear. The visualized skeletal structures are unremarkable. IMPRESSION: No active cardiopulmonary disease. Electronically Signed   By: Ted Mcalpine M.D.   On: 05/26/2021 11:10    Procedures Procedures   Medications Ordered in ED Medications  metroNIDAZOLE (FLAGYL) IVPB 500 mg (500 mg Intravenous New Bag/Given 05/26/21 1544)  cefTRIAXone (ROCEPHIN) 2 g in sodium chloride 0.9 % 100 mL IVPB (2 g Intravenous New Bag/Given 05/26/21 1547)    ED Course  I have reviewed the triage vital signs and the nursing notes.  Pertinent labs & imaging results that were available during my care of the patient were reviewed by me and considered in my medical decision making (see chart for details).  Clinical Course as of 05/26/21 1642  Sun May 26, 2021  1446 Plan to cover patient for aspiration pneumonia given hypoxia in context of aspiration this morning, however patient with history of allergy to penicillins.  Case discussed with pharmacist recommends Rocephin and Flagyl and will enter the doses for the patient.  I appreciate his collaboration in the care of this patient. [RS]    Clinical Course User Index [RS] Braxdon Gappa, Idelia Salm   MDM Rules/Calculators/A&P                         50 year old female with cerebral palsy who presents after possible aspiration of the hard-boiled eggs morning.  Hypoxic on intake to 80s on room air.  Vital signs are normal.  Cardiac exam is normal, pulmonary exam complicated by patient's poor participation.  Patient is hypoxic without O2 supplementation via .  On 3 L at time of my initial exam.  Abdominal exam is benign.  Patient is ambulatory in the emergency department but her O2 sat does drop with ambulation.  Patient ambulated in the ED off of oxygen with a drop in O2 to 90%.  Improved to 94-96 when resting but then dropped again to 89 while sitting in the bed.  Placed back on 2 L supplemental oxygen by nasal cannula.  Do  feel  patient would benefit from admission to hospital given new hypoxia following aspiration event this morning.  Chest x-ray is clear.  CBC with mild leukocytosis of 12,000, BMP with mild hyponatremia 132.  Patient with history of reaction to penicillins, has not tolerated them per chart review in the past.  We will proceed with Rocephin and Flagyl per discussion with pharmacist.  Discussed plan for admission with the patient and her mother who are both amenable to this disposition.  They voiced understanding of her medical evaluation and treatment plan thus far.  Each of their questions was answered to their expressed satisfaction.  They are amenable to plan for admission at this time.  This chart was dictated using voice recognition software, Dragon. Despite the best efforts of this provider to proofread and correct errors, errors may still occur which can change documentation meaning.   Final Clinical Impression(s) / ED Diagnoses Final diagnoses:  None    Rx / DC Orders ED Discharge Orders     None        Sherrilee Gilles 05/26/21 1642    Lorre Nick, MD 05/28/21 1556

## 2021-05-27 ENCOUNTER — Observation Stay (HOSPITAL_COMMUNITY): Payer: Medicare Other

## 2021-05-27 ENCOUNTER — Encounter (HOSPITAL_COMMUNITY): Payer: Self-pay

## 2021-05-27 DIAGNOSIS — R4701 Aphasia: Secondary | ICD-10-CM | POA: Diagnosis present

## 2021-05-27 DIAGNOSIS — Z79890 Hormone replacement therapy (postmenopausal): Secondary | ICD-10-CM | POA: Diagnosis not present

## 2021-05-27 DIAGNOSIS — Z882 Allergy status to sulfonamides status: Secondary | ICD-10-CM | POA: Diagnosis not present

## 2021-05-27 DIAGNOSIS — R0902 Hypoxemia: Secondary | ICD-10-CM | POA: Diagnosis present

## 2021-05-27 DIAGNOSIS — E039 Hypothyroidism, unspecified: Secondary | ICD-10-CM | POA: Diagnosis present

## 2021-05-27 DIAGNOSIS — Z880 Allergy status to penicillin: Secondary | ICD-10-CM | POA: Diagnosis not present

## 2021-05-27 DIAGNOSIS — T17908A Unspecified foreign body in respiratory tract, part unspecified causing other injury, initial encounter: Secondary | ICD-10-CM | POA: Diagnosis not present

## 2021-05-27 DIAGNOSIS — E871 Hypo-osmolality and hyponatremia: Secondary | ICD-10-CM | POA: Diagnosis present

## 2021-05-27 DIAGNOSIS — K219 Gastro-esophageal reflux disease without esophagitis: Secondary | ICD-10-CM | POA: Diagnosis present

## 2021-05-27 DIAGNOSIS — J9601 Acute respiratory failure with hypoxia: Secondary | ICD-10-CM | POA: Diagnosis present

## 2021-05-27 DIAGNOSIS — H913 Deaf nonspeaking, not elsewhere classified: Secondary | ICD-10-CM | POA: Diagnosis present

## 2021-05-27 DIAGNOSIS — F419 Anxiety disorder, unspecified: Secondary | ICD-10-CM | POA: Diagnosis present

## 2021-05-27 DIAGNOSIS — Z79899 Other long term (current) drug therapy: Secondary | ICD-10-CM | POA: Diagnosis not present

## 2021-05-27 DIAGNOSIS — J690 Pneumonitis due to inhalation of food and vomit: Secondary | ICD-10-CM | POA: Diagnosis present

## 2021-05-27 DIAGNOSIS — Z20822 Contact with and (suspected) exposure to covid-19: Secondary | ICD-10-CM | POA: Diagnosis present

## 2021-05-27 DIAGNOSIS — J45909 Unspecified asthma, uncomplicated: Secondary | ICD-10-CM | POA: Diagnosis present

## 2021-05-27 DIAGNOSIS — G809 Cerebral palsy, unspecified: Secondary | ICD-10-CM | POA: Diagnosis present

## 2021-05-27 DIAGNOSIS — K2270 Barrett's esophagus without dysplasia: Secondary | ICD-10-CM | POA: Diagnosis present

## 2021-05-27 DIAGNOSIS — F259 Schizoaffective disorder, unspecified: Secondary | ICD-10-CM | POA: Diagnosis present

## 2021-05-27 MED ORDER — ALBUTEROL SULFATE (2.5 MG/3ML) 0.083% IN NEBU: 2.5000 mg | INHALATION_SOLUTION | Freq: Four times a day (QID) | RESPIRATORY_TRACT | Status: DC

## 2021-05-27 MED ORDER — GUAIFENESIN ER 600 MG PO TB12
600.0000 mg | ORAL_TABLET | Freq: Two times a day (BID) | ORAL | Status: DC
  Administered 2021-05-27 – 2021-05-28 (×2): 600 mg via ORAL
  Filled 2021-05-27 (×2): qty 1

## 2021-05-27 MED ORDER — COVID-19MRNA BIVAL VACC PFIZER 30 MCG/0.3ML IM SUSP
0.3000 mL | Freq: Once | INTRAMUSCULAR | Status: DC
  Filled 2021-05-27: qty 0.3

## 2021-05-27 MED ORDER — ALBUTEROL SULFATE (2.5 MG/3ML) 0.083% IN NEBU
2.5000 mg | INHALATION_SOLUTION | Freq: Four times a day (QID) | RESPIRATORY_TRACT | Status: DC
  Administered 2021-05-27: 2.5 mg via RESPIRATORY_TRACT
  Filled 2021-05-27: qty 3

## 2021-05-27 NOTE — Progress Notes (Signed)
Modified Barium Swallow Progress Note  Patient Details  Name: Angela Nicholson MRN: [id number] Date of Birth: 07-09-1971  Today's Date: 05/27/2021  Modified Barium Swallow completed.  Full report located under Chart Review in the Imaging Section.  Brief recommendations include the following:  Clinical Impression  Patient presents with a mild oropharyngeal dysphagia which is improved in comparison to MBS on 11/16/20. Patient did not exhibit any aspiration during today's MBS as she had on previous. During oral phase, patient exhibited decreased mastication of solids, weak lingual manipulation leading to delays in anterior to posterior transit as well as premature spillage of boluses into vallecular sinus. During pharyngeal phase, patient exhibited swallow initiation delays to level of vallecular sinus with regular solids and puree solids and had delays to vallecular and pyriform sinus with thin liquids and nectar thick liquids. One instance of very trace penetration observed when barium tablet taken with thin liquid barium however penetrate did fully exit laryngeal vestibule. Trace vallecular and pyriform sinus residuals observed with thin liquids, which cleared with 1-2 subsequent swallows. No significant amount of vallecular residuals observed with regular solids but patient did have moderate vallecular residual briefly with first bite of puree solids, that cleared with subsequent swallow. Esophageal sweep did not reveal any s/s dysmotility or stasis of barium in upper esophagus. Patient did exhibit several instances of congested, non-productive coughs during this study however fluroscopy on for most of these coughing incidents and no penetration, aspiration or significant barium retention present during cough response. Patient is impulsive and has decreased ability to control liquid and solid consistencies in oral cavity and as she has h/o dysphagia with extensive amount of outpatient SLP treatment for  dysphagia (2019-2022) she must continue to have full supervision with PO's. SLP is recommending Dys 3 solids, thin liquids. Education completed with patient and mother.   Swallow Evaluation Recommendations       SLP Diet Recommendations: Dysphagia 3 (Mech soft) solids   Liquid Administration via: Cup;Straw   Medication Administration: Whole meds with puree   Supervision: Patient able to self feed;Full supervision/cueing for compensatory strategies   Compensations: Minimize environmental distractions;Slow rate;Small sips/bites   Postural Changes: Seated upright at 90 degrees   Oral Care Recommendations: Oral care BID      Angela Nevin, MA, CCC-SLP Speech Therapy

## 2021-05-27 NOTE — Evaluation (Signed)
Clinical/Bedside Swallow Evaluation Patient Details  Name: Angela Nicholson MRN: [id number] Date of Birth: Dec 29, 1970  Today's Date: 05/27/2021 Time: SLP Start Time (ACUTE ONLY): 1040 SLP Stop Time (ACUTE ONLY): 1100 SLP Time Calculation (min) (ACUTE ONLY): 20 min  Past Medical History:  Past Medical History:  Diagnosis Date   Asthma    Barrett esophagus    Deaf    GERD (gastroesophageal reflux disease)    Schizo affective schizophrenia (HCC)    Past Surgical History: History reviewed. No pertinent surgical history. HPI:  Patient is a 50 y.o. female with PMH: deaf mutism (uses ASL), cerebral palsy, GERD, Barret esophagus, asthma, schizoaffective disorder who presented to ED due to choking while eating an egg which was followed by a cough. rhonchi and hypoxia. Patient had been receiving outpatient ST services for her dysphagia from 08/17/2017 until discharged 01/07/2021. Most recent MBS was 11/16/2020 which reported "mild oropharyngeal dysphagia primarily characterized by impaired timing, which results in trace amounts of penetration (PAS 3) that can be easily cleared with a cued cough. Pt also had two instance of silent aspiration with thin liquids due to premature spillage from the oral cavity." No aspiration observed when patient drinking thin liquids through straw and performing chin tuck posture.    Assessment / Plan / Recommendation  Clinical Impression  Patient presents with clinical s/s of dysphagia as per this bedside swallow evaluation. She exhibited congested, non productive cough prior to, during and after PO intake of thin liquids, puree solids, regular solids. As she is mute, only produced mimimal amount of vocalizations so difficult to adequately assess vocal quality post PO intake. Despite using tele interpreter for ASL, patient was not clear on her response when SLP asked her if her swallow function has changed recently. As patient has a long h/o dysphagia and was followed by OP  SLP from 2019-2022, recommendation is for MBS to objectively assess swallow function and compare to MBS completed in March of 2022. Patient in agreement with this plan. SLP Visit Diagnosis: Dysphagia, oropharyngeal phase (R13.12)    Aspiration Risk  Moderate aspiration risk    Diet Recommendation Regular;Thin liquid   Liquid Administration via: Cup;Straw Medication Administration: Whole meds with liquid Supervision: Patient able to self feed;Intermittent supervision to cue for compensatory strategies Compensations: Minimize environmental distractions;Slow rate;Small sips/bites Postural Changes: Seated upright at 90 degrees;Remain upright for at least 30 minutes after po intake    Other  Recommendations Oral Care Recommendations: Oral care BID;Staff/trained caregiver to provide oral care    Recommendations for follow up therapy are one component of a multi-disciplinary discharge planning process, led by the attending physician.  Recommendations may be updated based on patient status, additional functional criteria and insurance authorization.  Follow up Recommendations Other (comment) (TBD)      Frequency and Duration min 2x/week  1 week       Prognosis Prognosis for Safe Diet Advancement: Fair Barriers to Reach Goals: Time post onset Barriers/Prognosis Comment: patient with documented long h/o dysphagia (received OP SLP for dysphagia tx from 2019-2022)      Swallow Study   General Date of Onset: 05/27/21 HPI: Patient is a 50 y.o. female with PMH: deaf mutism (uses ASL), cerebral palsy, GERD, Barret esophagus, asthma, schizoaffective disorder who presented to ED due to choking while eating an egg which was followed by a cough. rhonchi and hypoxia. Patient had been receiving outpatient ST services for her dysphagia from 08/17/2017 until discharged 01/07/2021. Most recent MBS was 11/16/2020 which reported "mild oropharyngeal  dysphagia primarily characterized by impaired timing, which  results in trace amounts of penetration (PAS 3) that can be easily cleared with a cued cough. Pt also had two instance of silent aspiration with thin liquids due to premature spillage from the oral cavity." No aspiration observed when patient drinking thin liquids through straw and performing chin tuck posture. Type of Study: Bedside Swallow Evaluation Previous Swallow Assessment: OP MBS 11/16/20 Diet Prior to this Study: Regular Temperature Spikes Noted: No Respiratory Status: Room air History of Recent Intubation: No Behavior/Cognition: Alert;Cooperative;Pleasant mood Oral Cavity Assessment: Within Functional Limits Oral Care Completed by SLP: No Oral Cavity - Dentition: Adequate natural dentition Vision: Functional for self-feeding Self-Feeding Abilities: Able to feed self Patient Positioning: Upright in bed Baseline Vocal Quality: Other (comment) (patient with limited vocalizations secondary to mutism but limited sample appeared [facility]) Volitional Cough: Congested Volitional Swallow: Able to elicit    Oral/Motor/Sensory Function Overall Oral Motor/Sensory Function: Other (comment) (decreased musculature movement and control due to cp)   Ice Chips     Thin Liquid Thin Liquid: Impaired Presentation: Self Fed;Straw Pharyngeal  Phase Impairments: Cough - Delayed;Throat Clearing - Delayed Other Comments: Patient with a congested sounding, non-productive cough prior to, during and after PO intake    Nectar Thick     Honey Thick     Puree Puree: Within functional limits Presentation: Self Fed;Spoon   Solid     Solid: Impaired Presentation: Self Fed Oral Phase Impairments: Impaired mastication;Reduced lingual movement/coordination Pharyngeal Phase Impairments: Suspected delayed Swallow Other Comments: gelatin as well as graham crackers      Angela Nevin, MA, CCC-SLP Speech Therapy

## 2021-05-27 NOTE — Progress Notes (Signed)
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  Her oxygen saturation was 87% on room air on admission.  Patient is not on oxygen at home. Her chest x-ray did not show any acute findings.  Influenza and COVID-negative. Assessment & Plan:   Principal Problem:   Aspiration into airway, initial encounter Active Problems:   Hypothyroidism   Cerebral palsy (HCC)   Schizo affective schizophrenia (HCC)   Deafmutism   Anxiety   Asthma   Barrett esophagus   Hyponatremia   Hypocalcemia    #1 acute hypoxic respiratory failure secondary to aspiration and choking-patient admitted with hypoxemia choking while eating.  Mother reports her face became white and lips became blue and she brought out a big piece of egg and thick mucus.  She was 87% on room air with EMS.  She was then placed on oxygen at 2 L. Speech therapy consulted and she is going for modified barium swallow. Will continue Rocephin and Flagyl  patient has diffuse wheezing we will give her another dose of Solu-Medrol.  Check ambulatory oxygen saturation    #2 mild hyponatremia on NS, sodium 132  #3 hypothyroidism continue Synthroid.  Last TSH was checked in 2014 and it was 3.2 will repeat TSH this admission.  #4 history of anxiety she is affective disorder on Remeron and Zyprexa  #5 Barrett's esophagus on PPI  #6 history of CP/deafness/mutism lives with mother continue supportive care    Estimated body mass index is 22.34 kg/m as calculated from the following:   Height as of this encounter: 4\' 11"  (1.499 m).   Weight as of this encounter: 50.2 kg.  DVT prophylaxis: Lovenox Code Status: Full code Family  Communication: Discussed with mother in the room Disposition Plan:  Status is: Inpatient The patient will require care spanning > 2 midnights and should be moved to inpatient because: Hypoxia aspiration      Consultants:  Speech therapy  Procedures: Modified barium swallow Antimicrobials: Rocephin and Flagyl Subjective: She is resting in bed mother is by the bedside communicated with sign language interpretation She complains of shortness of breath and wheezing  Objective: Vitals:   05/26/21 2234 05/27/21 0235 05/27/21 0632 05/27/21 1143  BP: (!) 154/85 (!) 148/78 (!) 140/98   Pulse: 71 89 72   Resp: 19 19 17    Temp: 98.4 F (36.9 C) 98.1 F (36.7 C) (!) 97.5 F (36.4 C)   TempSrc: Oral Oral Oral   SpO2: 100% 100% 100%   Weight:    50.2 kg  Height:    4\' 11"  (1.499 m)    Intake/Output Summary (Last 24 hours) at 05/27/2021 1256 Last data filed at 05/26/2021 1728 Gross per 24 hour  Intake 200 ml  Output --  Net 200 ml   Filed Weights   05/27/21 1143  Weight: 50.2 kg    Examination:  General exam: Appears calm and comfortable  Respiratory system: Diffuse wheezing to auscultation. Respiratory effort normal. Cardiovascular system: S1 & S2 heard, RRR. No JVD, murmurs, rubs, gallops or clicks. No pedal edema. Gastrointestinal system: Abdomen is nondistended, soft and nontender. No organomegaly or masses felt. Normal bowel sounds heard. Central nervous system: Alert and oriented. No  focal neurological deficits. Extremities: Symmetric 5 x 5 power. Skin: No rashes, lesions or ulcers Psychiatry: Judgement and insight appear normal. Mood & affect appropriate.     Data Reviewed: I have personally reviewed following labs and imaging studies  CBC: Recent Labs  Lab 05/26/21 1428  WBC 12.4*  NEUTROABS 9.7*  HGB 12.3  HCT 37.7  MCV 83.8  PLT 355   Basic Metabolic Panel: Recent Labs  Lab 05/26/21 1428  NA 132*  K 4.2  CL 98  CO2 28  GLUCOSE 93  BUN 17   CREATININE 0.65  CALCIUM 8.4*   GFR: Estimated Creatinine Clearance: 56.7 mL/min (by C-G formula based on SCr of 0.65 mg/dL). Liver Function Tests: No results for input(s): AST, ALT, ALKPHOS, BILITOT, PROT, ALBUMIN in the last 168 hours. No results for input(s): LIPASE, AMYLASE in the last 168 hours. No results for input(s): AMMONIA in the last 168 hours. Coagulation Profile: No results for input(s): INR, PROTIME in the last 168 hours. Cardiac Enzymes: No results for input(s): CKTOTAL, CKMB, CKMBINDEX, TROPONINI in the last 168 hours. BNP (last 3 results) No results for input(s): PROBNP in the last 8760 hours. HbA1C: No results for input(s): HGBA1C in the last 72 hours. CBG: No results for input(s): GLUCAP in the last 168 hours. Lipid Profile: No results for input(s): CHOL, HDL, LDLCALC, TRIG, CHOLHDL, LDLDIRECT in the last 72 hours. Thyroid Function Tests: No results for input(s): TSH, T4TOTAL, FREET4, T3FREE, THYROIDAB in the last 72 hours. Anemia Panel: No results for input(s): VITAMINB12, FOLATE, FERRITIN, TIBC, IRON, RETICCTPCT in the last 72 hours. Sepsis Labs: No results for input(s): PROCALCITON, LATICACIDVEN in the last 168 hours.  Recent Results (from the past 240 hour(s))  Resp Panel by RT-PCR (Flu A&B, Covid) Nasopharyngeal Swab     Status: None   Collection Time: 05/26/21  5:28 PM   Specimen: Nasopharyngeal Swab; Nasopharyngeal(NP) swabs in vial transport medium  Result Value Ref Range Status   SARS Coronavirus 2 by RT PCR NEGATIVE NEGATIVE Final    Comment: (NOTE) SARS-CoV-2 target nucleic acids are NOT DETECTED.  The SARS-CoV-2 RNA is generally detectable in upper respiratory specimens during the acute phase of infection. The lowest concentration of SARS-CoV-2 viral copies this assay can detect is 138 copies/mL. A negative result does not preclude SARS-Cov-2 infection and should not be used as the sole basis for treatment or other patient management  decisions. A negative result may occur with  improper specimen collection/handling, submission of specimen other than nasopharyngeal swab, presence of viral mutation(s) within the areas targeted by this assay, and inadequate number of viral copies(<138 copies/mL). A negative result must be combined with clinical observations, patient history, and epidemiological information. The expected result is Negative.  Fact Sheet for Patients:  BloggerCourse.com  Fact Sheet for Healthcare Providers:  SeriousBroker.it  This test is no t yet approved or cleared by the [place] FDA and  has been authorized for detection and/or diagnosis of SARS-CoV-2 by FDA under an Emergency Use Authorization (EUA). This EUA will remain  in effect (meaning this test can be used) for the duration of the COVID-19 declaration under Section 564(b)(1) of the Act, 21 U.S.C.section 360bbb-3(b)(1), unless the authorization is terminated  or revoked sooner.       Influenza A by PCR NEGATIVE NEGATIVE Final   Influenza B by PCR NEGATIVE NEGATIVE Final    Comment: (NOTE) The Xpert Xpress SARS-CoV-2/FLU/RSV plus assay is intended as an aid in the diagnosis of influenza from Nasopharyngeal  swab specimens and should not be used as a sole basis for treatment. Nasal washings and aspirates are unacceptable for Xpert Xpress SARS-CoV-2/FLU/RSV testing.  Fact Sheet for Patients: BloggerCourse.com  Fact Sheet for Healthcare Providers: SeriousBroker.it  This test is not yet approved or cleared by the [place] FDA and has been authorized for detection and/or diagnosis of SARS-CoV-2 by FDA under an Emergency Use Authorization (EUA). This EUA will remain in effect (meaning this test can be used) for the duration of the COVID-19 declaration under Section 564(b)(1) of the Act, 21 U.S.C. section 360bbb-3(b)(1), unless the  authorization is terminated or revoked.  Performed at [facility], [address], [place], [place] [address]          Radiology Studies: DG Chest 2 View  Result Date: 05/26/2021 CLINICAL DATA:  Aspiration. EXAM: CHEST - 2 VIEW COMPARISON:  None. FINDINGS: The heart size and mediastinal contours are within normal limits. Both lungs are clear. The visualized skeletal structures are unremarkable. IMPRESSION: No active cardiopulmonary disease. Electronically Signed   By: Ted Mcalpine M.D.   On: 05/26/2021 11:10        Scheduled Meds:  atorvastatin  20 mg Oral Daily   [START ON 05/28/2021] COVID-19 mRNA bivalent vaccine (Pfizer)  0.3 mL Intramuscular ONCE-1600   divalproex  500 mg Oral BID   enoxaparin (LOVENOX) injection  30 mg Subcutaneous Q24H   levothyroxine  150 mcg Oral Daily   loratadine  10 mg Oral Daily   mirtazapine  45 mg Oral QHS   montelukast  10 mg Oral QPM   naltrexone  100 mg Oral BID   OLANZapine  20 mg Oral QHS   pantoprazole  40 mg Oral Daily   rOPINIRole  1 mg Oral BID   Continuous Infusions:  sodium chloride 100 mL/hr at 05/26/21 1854   cefTRIAXone (ROCEPHIN)  IV 1 g (05/27/21 1046)   metronidazole 500 mg (05/27/21 0907)     LOS: 0 days    Alwyn Ren, MD 05/27/2021, 12:56 PM

## 2021-05-28 DIAGNOSIS — T17908A Unspecified foreign body in respiratory tract, part unspecified causing other injury, initial encounter: Secondary | ICD-10-CM | POA: Diagnosis not present

## 2021-05-28 MED ORDER — ALBUTEROL SULFATE (2.5 MG/3ML) 0.083% IN NEBU
2.5000 mg | INHALATION_SOLUTION | Freq: Three times a day (TID) | RESPIRATORY_TRACT | Status: DC
  Administered 2021-05-28: 2.5 mg via RESPIRATORY_TRACT
  Filled 2021-05-28: qty 3

## 2021-05-28 MED ORDER — ALBUTEROL SULFATE (2.5 MG/3ML) 0.083% IN NEBU: 2.5000 mg | INHALATION_SOLUTION | Freq: Two times a day (BID) | RESPIRATORY_TRACT | Status: DC

## 2021-05-28 MED ORDER — GUAIFENESIN ER 600 MG PO TB12
600.0000 mg | ORAL_TABLET | Freq: Two times a day (BID) | ORAL | Status: AC
Start: 2021-05-28 — End: ?

## 2021-05-28 MED ORDER — LEVOFLOXACIN 500 MG PO TABS: 500.0000 mg | ORAL_TABLET | Freq: Every day | ORAL | 0 refills | Status: AC

## 2021-05-28 NOTE — Progress Notes (Signed)
Patient ambulated around 60 feet in hallway, with minimal assist. Virtual interpreter was taken with patient for assistance. Pt tolerated well, O2 Sats stayed 93-97% on RA.

## 2021-05-28 NOTE — Discharge Summary (Signed)
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  Her oxygen saturation was 87% on room air on admission.  Patient is not on oxygen at home. Her chest x-ray did not show any acute findings.  Influenza and COVID-negative.  Discharge Diagnoses:  Principal Problem:   Aspiration into airway, initial encounter Active Problems:   Hypothyroidism   Cerebral palsy (HCC)   Schizo affective schizophrenia (HCC)   Deafmutism   Anxiety   Asthma   Barrett esophagus   Hyponatremia   Hypocalcemia   Hypoxemia  #1 acute hypoxic respiratory failure secondary to aspiration and choking-patient admitted with hypoxemia choking while eating.  Mother reports her face became white and lips became blue and she brought out a big piece of egg and thick mucus.  She was 87% on room air with EMS.  She was then placed on oxygen at 2 L.  The day of discharge she ambulated 60 feet in the hallway with minimal assist.  O2 saturation remained at 93 to 97% on room air with ambulation without O2. Speech therapy consulted modified barium swallow-did not reveal any signs of aspiration.  She was treated with Rocephin and Flagyl.  She was discharged on Levaquin 500 mg daily for 4 more days to finish a course of 5 days.   #2 mild hyponatremia on NS, sodium  132   #3 hypothyroidism continue Synthroid.    #4 history of anxiety she is affective disorder on Remeron and Zyprexa   #5 Barrett's esophagus on PPI   #6 history of CP/deafness/mutism lives with mother continue supportive care   Estimated body mass index is 22.34 kg/m as calculated from the following:   Height as of this encounter: 4\' 11"  (1.499 m).   Weight as of this encounter: 50.2 kg.  Discharge Instructions  Discharge Instructions     Diet - low sodium heart healthy   Complete by: As directed    Increase activity slowly   Complete by: As directed       Allergies as of 05/28/2021       Reactions   Penicillins Rash   Sulfa Antibiotics Rash        Medication List     STOP taking these medications    doxycycline 100 MG tablet Commonly known as: VIBRA-TABS       TAKE these medications    acetaminophen 500 MG tablet Commonly known as: TYLENOL Take 500 mg by mouth every 6 (six) hours as needed (cramps/pain).   atorvastatin 20 MG tablet Commonly known as: LIPITOR Take 20 mg by mouth See admin instructions. Take one tablet (20 mg) by mouth daily at 3pm   CALCIUM-D PO Take 1 Dose by mouth every morning. Liquid - Calcium 1500 mg/ Vitamin D3 600 units   divalproex 250 MG DR tablet Commonly known as: DEPAKOTE Take 500 mg by mouth 2 (two) times daily. 8am and 8pm   guaiFENesin  600 MG 12 hr tablet Commonly known as: MUCINEX Take 1 tablet (600 mg total) by mouth 2 (two) times daily.   ipratropium-albuterol 0.5-2.5 (3) MG/3ML Soln Commonly known as: DUONEB Take 3 mLs by nebulization every 4 (four) hours as needed (shortness of breath/wheezing).   levocetirizine 5 MG tablet Commonly known as: XYZAL Take 5 mg by mouth See admin instructions. Take one tablet (5 mg) by mouth every morning or take one tablet of loratadine 10 mg every morning (alternating monthly)   levofloxacin 500 MG tablet Commonly known as: Levaquin Take 1 tablet (500 mg total) by mouth  daily for 4 days.   levothyroxine 150 MCG tablet Commonly known as: SYNTHROID Take 150 mcg by mouth See admin instructions. Take one tablet (150 mcg) by mouth daily at 3pm   loratadine 10 MG tablet Commonly known as: CLARITIN Take 10 mg by mouth See admin instructions. Take one tablet (10 mg) by mouth every morning or take one tablet of levocetirizine 5 mg every morning (alternating monthly)   mirtazapine 45 MG tablet Commonly known as: REMERON Take 45 mg by mouth at bedtime.   montelukast 10 MG tablet Commonly known as: SINGULAIR Take 10 mg by mouth every evening. 8pm   naltrexone 50 MG tablet Commonly known as: DEPADE Take 100 mg by mouth 2 (two) times daily. 8am and 8pm   Nortrel 7/7/7 0.5/0.75/1-35 MG-MCG tablet Generic drug: norethindrone-ethinyl estradiol Take 1 tablet by mouth See admin instructions. Take one tablet by mouth daily at 3pm   OLANZapine 20 MG tablet Commonly known as: ZYPREXA Take 20 mg by mouth at bedtime.   omeprazole 20 MG tablet Commonly known as: PRILOSEC OTC Take 20 mg by mouth every morning.   rOPINIRole 1 MG tablet Commonly known as: REQUIP Take 1 mg by mouth 2 (two) times daily. 8am and 8pm        Follow-up Information     Corrington, Kip A, MD Follow up.   Specialty: Family Medicine Contact information: [address] [place] [address] [phone]                Allergies  Allergen Reactions   Penicillins Rash   Sulfa Antibiotics Rash    Consultations: none   Procedures/Studies: DG Chest 2 View  Result Date: 05/26/2021 CLINICAL DATA:  Aspiration. EXAM: CHEST - 2 VIEW COMPARISON:  None. FINDINGS: The heart size and mediastinal contours are within normal limits. Both lungs are clear. The visualized skeletal structures are unremarkable. IMPRESSION: No active cardiopulmonary disease. Electronically Signed   By: Ted Mcalpine M.D.   On: 05/26/2021 11:10   DG Swallowing Func-Speech Pathology  Result  Date: 05/27/2021 Table formatting from the original result was not included. Objective Swallowing Evaluation: Type of Study: MBS-Modified Barium Swallow Study  Patient Details Name: Angela Nicholson MRN: [id number] Date of Birth: 06/06/1971 Today's Date: 05/27/2021 Time: SLP Start Time (ACUTE ONLY): 1305 -SLP Stop Time (ACUTE ONLY): 1330 SLP Time Calculation (min) (ACUTE ONLY): 25 min Past Medical History: Past Medical History: Diagnosis Date  Asthma   Barrett esophagus   Deaf   GERD (gastroesophageal reflux disease)   Schizo affective schizophrenia (HCC)  Past Surgical History: No past surgical history on file. HPI: Patient is a 50 y.o. female with PMH: deaf mutism (uses ASL), cerebral palsy, GERD, Barret esophagus, asthma, schizoaffective disorder who presented to ED due to choking while eating an egg which was followed by a cough. rhonchi and hypoxia. Patient had been receiving outpatient ST  services for her dysphagia from 08/17/2017 until discharged 01/07/2021. Most recent MBS was 11/16/2020 which reported "mild oropharyngeal dysphagia primarily characterized by impaired timing, which results in trace amounts of penetration (PAS 3) that can be easily cleared with a cued cough. Pt also had two instance of silent aspiration with thin liquids due to premature spillage from the oral cavity." No aspiration observed when patient drinking thin liquids through straw and performing chin tuck posture.  Subjective: pleasant, understanding of reasoning for swallow testing, tele interpreter used for ASL Assessment / Plan / Recommendation CHL IP CLINICAL IMPRESSIONS 05/27/2021 Clinical Impression Patient presents with a mild oropharyngeal dysphagia which is improved in comparison to MBS on 11/16/20. Patient did not exhibit any aspiration during today's MBS as she had on previous. During oral phase, patient exhibited decreased mastication of solids, weak lingual manipulation leading to delays in anterior to posterior transit as well  as premature spillage of boluses into vallecular sinus. During pharyngeal phase, patient exhibited swallow initiation delays to level of vallecular sinus with regular solids and puree solids and had delays to vallecular and pyriform sinus with thin liquids and nectar thick liquids. One instance of very trace penetration observed when barium tablet taken with thin liquid barium however penetrate did fully exit laryngeal vestibule. Trace vallecular and pyriform sinus residuals observed with thin liquids, which cleared with 1-2 subsequent swallows. No significant amount of vallecular residuals observed with regular solids but patient did have moderate vallecular residual briefly with first bite of puree solids, that cleared with subsequent swallow. Esophageal sweep did not reveal any s/s dysmotility or stasis of barium in upper esophagus. Patient did exhibit several instances of congested, non-productive coughs during this study however fluroscopy on for most of these coughing incidents and no penetration, aspiration or significant barium retention present during cough response. Patient is impulsive and has decreased ability to control liquid and solid consistencies in oral cavity and as she has h/o dysphagia with extensive amount of outpatient SLP treatment for dysphagia (2019-2022) she must continue to have full supervision with PO's. SLP is recommending Dys 3 solids, thin liquids. Education completed with patient and mother. SLP Visit Diagnosis Dysphagia, oropharyngeal phase (R13.12) Attention and concentration deficit following -- Frontal lobe and executive function deficit following -- Impact on safety and function Mild aspiration risk   CHL IP TREATMENT RECOMMENDATION 05/27/2021 Treatment Recommendations Therapy as outlined in treatment plan below   Prognosis 05/27/2021 Prognosis for Safe Diet Advancement Good Barriers to Reach Goals -- Barriers/Prognosis Comment -- CHL IP DIET RECOMMENDATION 05/27/2021 SLP Diet  Recommendations Dysphagia 3 (Mech soft) solids Liquid Administration via Cup;Straw Medication Administration Whole meds with puree Compensations Minimize environmental distractions;Slow rate;Small sips/bites Postural Changes Seated upright at 90 degrees   CHL IP OTHER RECOMMENDATIONS 05/27/2021 Recommended Consults -- Oral Care Recommendations Oral care BID Other Recommendations --   CHL IP FOLLOW UP RECOMMENDATIONS 05/27/2021 Follow up Recommendations None   CHL IP FREQUENCY AND DURATION 05/27/2021 Speech Therapy Frequency (ACUTE ONLY) min 1 x/week Treatment Duration 1 week      CHL IP ORAL PHASE 05/27/2021 Oral Phase Impaired Oral - Pudding Teaspoon -- Oral - Pudding Cup -- Oral - Honey Teaspoon -- Oral - Honey Cup -- Oral - Nectar Teaspoon -- Oral - Nectar Cup Weak lingual manipulation;Premature spillage Oral - Nectar Straw -- Oral - Thin Teaspoon -- Oral - Thin Cup Weak lingual manipulation;Premature spillage Oral - Thin Straw Weak lingual manipulation;Premature spillage Oral - Puree Premature spillage;Weak lingual manipulation Oral - Mech  Soft -- Oral - Regular Impaired mastication;Decreased bolus cohesion;Piecemeal swallowing;Premature spillage Oral - Multi-Consistency -- Oral - Pill Decreased bolus cohesion;Weak lingual manipulation Oral Phase - Comment --  CHL IP PHARYNGEAL PHASE 05/27/2021 Pharyngeal Phase Impaired Pharyngeal- Pudding Teaspoon -- Pharyngeal -- Pharyngeal- Pudding Cup -- Pharyngeal -- Pharyngeal- Honey Teaspoon -- Pharyngeal -- Pharyngeal- Honey Cup -- Pharyngeal -- Pharyngeal- Nectar Teaspoon -- Pharyngeal -- Pharyngeal- Nectar Cup -- Pharyngeal -- Pharyngeal- Nectar Straw -- Pharyngeal -- Pharyngeal- Thin Teaspoon -- Pharyngeal -- Pharyngeal- Thin Cup Delayed swallow initiation-vallecula;Delayed swallow initiation-pyriform sinuses;Pharyngeal residue - valleculae;Pharyngeal residue - pyriform Pharyngeal -- Pharyngeal- Thin Straw Delayed swallow initiation-pyriform sinuses;Delayed swallow  initiation-vallecula;Pharyngeal residue - valleculae;Pharyngeal residue - pyriform Pharyngeal -- Pharyngeal- Puree Delayed swallow initiation-vallecula;Pharyngeal residue - valleculae Pharyngeal -- Pharyngeal- Mechanical Soft -- Pharyngeal -- Pharyngeal- Regular Delayed swallow initiation-vallecula Pharyngeal -- Pharyngeal- Multi-consistency -- Pharyngeal -- Pharyngeal- Pill Penetration/Aspiration during swallow;Reduced airway/laryngeal closure Pharyngeal Material enters airway, remains ABOVE vocal cords and not ejected out;Material enters airway, remains ABOVE vocal cords then ejected out Pharyngeal Comment --  CHL IP CERVICAL ESOPHAGEAL PHASE 05/27/2021 Cervical Esophageal Phase WFL Pudding Teaspoon -- Pudding Cup -- Honey Teaspoon -- Honey Cup -- Nectar Teaspoon -- Nectar Cup -- Nectar Straw -- Thin Teaspoon -- Thin Cup -- Thin Straw -- Puree -- Mechanical Soft -- Regular -- Multi-consistency -- Pill -- Cervical Esophageal Comment -- Angela Nevin, MA, CCC-SLP Speech Therapy              (Echo, Carotid, EGD, Colonoscopy, ERCP)    Subjective:  She is sitting up in bed mother by the bedside Discharge Exam: Vitals:   05/28/21 1038 05/28/21 1426  BP: 131/73 118/68  Pulse: 78 69  Resp: 18 20  Temp: 97.6 F (36.4 C) (!) 97.5 F (36.4 C)  SpO2: 99% 96%   Vitals:   05/28/21 0404 05/28/21 0823 05/28/21 1038 05/28/21 1426  BP: (!) 146/73  131/73 118/68  Pulse: 64  78 69  Resp:   18 20  Temp:   97.6 F (36.4 C) (!) 97.5 F (36.4 C)  TempSrc:   Oral Oral  SpO2: 100% 97% 99% 96%  Weight:      Height:        General: Pt is alert, awake, not in acute distress Cardiovascular: RRR, S1/S2 +, no rubs, no gallops Respiratory: CTA bilaterally, no wheezing, no rhonchi Abdominal: Soft, NT, ND, bowel sounds + Extremities: no edema, no cyanosis    The results of significant diagnostics from this hospitalization (including imaging, microbiology, ancillary and laboratory) are listed below for  reference.     Microbiology: Recent Results (from the past 240 hour(s))  Resp Panel by RT-PCR (Flu A&B, Covid) Nasopharyngeal Swab     Status: None   Collection Time: 05/26/21  5:28 PM   Specimen: Nasopharyngeal Swab; Nasopharyngeal(NP) swabs in vial transport medium  Result Value Ref Range Status   SARS Coronavirus 2 by RT PCR NEGATIVE NEGATIVE Final    Comment: (NOTE) SARS-CoV-2 target nucleic acids are NOT DETECTED.  The SARS-CoV-2 RNA is generally detectable in upper respiratory specimens during the acute phase of infection. The lowest concentration of SARS-CoV-2 viral copies this assay can detect is 138 copies/mL. A negative result does not preclude SARS-Cov-2 infection and should not be used as the sole basis for treatment or other patient management decisions. A negative result may occur with  improper specimen collection/handling, submission of specimen other than nasopharyngeal swab, presence of viral mutation(s) within the areas targeted by this  assay, and inadequate number of viral copies(<138 copies/mL). A negative result must be combined with clinical observations, patient history, and epidemiological information. The expected result is Negative.  Fact Sheet for Patients:  BloggerCourse.com  Fact Sheet for Healthcare Providers:  SeriousBroker.it  This test is no t yet approved or cleared by the [place] FDA and  has been authorized for detection and/or diagnosis of SARS-CoV-2 by FDA under an Emergency Use Authorization (EUA). This EUA will remain  in effect (meaning this test can be used) for the duration of the COVID-19 declaration under Section 564(b)(1) of the Act, 21 U.S.C.section 360bbb-3(b)(1), unless the authorization is terminated  or revoked sooner.       Influenza A by PCR NEGATIVE NEGATIVE Final   Influenza B by PCR NEGATIVE NEGATIVE Final    Comment: (NOTE) The Xpert Xpress SARS-CoV-2/FLU/RSV  plus assay is intended as an aid in the diagnosis of influenza from Nasopharyngeal swab specimens and should not be used as a sole basis for treatment. Nasal washings and aspirates are unacceptable for Xpert Xpress SARS-CoV-2/FLU/RSV testing.  Fact Sheet for Patients: BloggerCourse.com  Fact Sheet for Healthcare Providers: SeriousBroker.it  This test is not yet approved or cleared by the [place] FDA and has been authorized for detection and/or diagnosis of SARS-CoV-2 by FDA under an Emergency Use Authorization (EUA). This EUA will remain in effect (meaning this test can be used) for the duration of the COVID-19 declaration under Section 564(b)(1) of the Act, 21 U.S.C. section 360bbb-3(b)(1), unless the authorization is terminated or revoked.  Performed at [facility], [address] [place], [place] [address]      Labs: BNP (last 3 results) No results for input(s): BNP in the last 8760 hours. Basic Metabolic Panel: Recent Labs  Lab 05/26/21 1428  NA 132*  K 4.2  CL 98  CO2 28  GLUCOSE 93  BUN 17  CREATININE 0.65  CALCIUM 8.4*   Liver Function Tests: No results for input(s): AST, ALT, ALKPHOS, BILITOT, PROT, ALBUMIN in the last 168 hours. No results for input(s): LIPASE, AMYLASE in the last 168 hours. No results for input(s): AMMONIA in the last 168 hours. CBC: Recent Labs  Lab 05/26/21 1428  WBC 12.4*  NEUTROABS 9.7*  HGB 12.3  HCT 37.7  MCV 83.8  PLT 355   Cardiac Enzymes: No results for input(s): CKTOTAL, CKMB, CKMBINDEX, TROPONINI in the last 168 hours. BNP: Invalid input(s): POCBNP CBG: No results for input(s): GLUCAP in the last 168 hours. D-Dimer No results for input(s): DDIMER in the last 72 hours. Hgb A1c No results for input(s): HGBA1C in the last 72 hours. Lipid Profile No results for input(s): CHOL, HDL, LDLCALC, TRIG, CHOLHDL, LDLDIRECT in the last 72  hours. Thyroid function studies No results for input(s): TSH, T4TOTAL, T3FREE, THYROIDAB in the last 72 hours.  Invalid input(s): FREET3 Anemia work up No results for input(s): VITAMINB12, FOLATE, FERRITIN, TIBC, IRON, RETICCTPCT in the last 72 hours. Urinalysis    Component Value Date/Time   COLORURINE YELLOW 07/14/2013 0318   APPEARANCEUR CLOUDY (A) 07/14/2013 0318   LABSPEC 1.021 07/14/2013 0318   PHURINE 5.5 07/14/2013 0318   GLUCOSEU NEGATIVE 07/14/2013 0318   HGBUR TRACE (A) 07/14/2013 0318   BILIRUBINUR NEGATIVE 07/14/2013 0318   KETONESUR NEGATIVE 07/14/2013 0318   PROTEINUR NEGATIVE 07/14/2013 0318   UROBILINOGEN 0.2 07/14/2013 0318   NITRITE NEGATIVE 07/14/2013 0318   LEUKOCYTESUR NEGATIVE 07/14/2013 0318   Sepsis Labs Invalid input(s): PROCALCITONIN,  WBC,  LACTICIDVEN Microbiology Recent Results (from the past 240 hour(s))  Resp Panel by RT-PCR (Flu A&B, Covid) Nasopharyngeal Swab     Status: None   Collection Time: 05/26/21  5:28 PM   Specimen: Nasopharyngeal Swab; Nasopharyngeal(NP) swabs in vial transport medium  Result Value Ref Range Status   SARS Coronavirus 2 by RT PCR NEGATIVE NEGATIVE Final    Comment: (NOTE) SARS-CoV-2 target nucleic acids are NOT DETECTED.  The SARS-CoV-2 RNA is generally detectable in upper respiratory specimens during the acute phase of infection. The lowest concentration of SARS-CoV-2 viral copies this assay can detect is 138 copies/mL. A negative result does not preclude SARS-Cov-2 infection and should not be used as the sole basis for treatment or other patient management decisions. A negative result may occur with  improper specimen collection/handling, submission of specimen other than nasopharyngeal swab, presence of viral mutation(s) within the areas targeted by this assay, and inadequate number of viral copies(<138 copies/mL). A negative result must be combined with clinical observations, patient history, and  epidemiological information. The expected result is Negative.  Fact Sheet for Patients:  BloggerCourse.com  Fact Sheet for Healthcare Providers:  SeriousBroker.it  This test is no t yet approved or cleared by the [place] FDA and  has been authorized for detection and/or diagnosis of SARS-CoV-2 by FDA under an Emergency Use Authorization (EUA). This EUA will remain  in effect (meaning this test can be used) for the duration of the COVID-19 declaration under Section 564(b)(1) of the Act, 21 U.S.C.section 360bbb-3(b)(1), unless the authorization is terminated  or revoked sooner.       Influenza A by PCR NEGATIVE NEGATIVE Final   Influenza B by PCR NEGATIVE NEGATIVE Final    Comment: (NOTE) The Xpert Xpress SARS-CoV-2/FLU/RSV plus assay is intended as an aid in the diagnosis of influenza from Nasopharyngeal swab specimens and should not be used as a sole basis for treatment. Nasal washings and aspirates are unacceptable for Xpert Xpress SARS-CoV-2/FLU/RSV testing.  Fact Sheet for Patients: BloggerCourse.com  Fact Sheet for Healthcare Providers: SeriousBroker.it  This test is not yet approved or cleared by the [place] FDA and has been authorized for detection and/or diagnosis of SARS-CoV-2 by FDA under an Emergency Use Authorization (EUA). This EUA will remain in effect (meaning this test can be used) for the duration of the COVID-19 declaration under Section 564(b)(1) of the Act, 21 U.S.C. section 360bbb-3(b)(1), unless the authorization is terminated or revoked.  Performed at [facility], [address] [place], [place] [address]      Time coordinating discharge: 39 minutes  SIGNED:   Alwyn Ren, MD  [facility] 05/28/2021, 3:33 PM

## 2021-12-02 ENCOUNTER — Ambulatory Visit (HOSPITAL_COMMUNITY)
Admission: EM | Admit: 2021-12-02 | Discharge: 2021-12-02 | Disposition: A | Discharge status: Home / Self Care | Payer: Medicare Other | Attending: Physician Assistant | Admitting: Physician Assistant

## 2021-12-02 ENCOUNTER — Encounter (HOSPITAL_COMMUNITY): Payer: Self-pay

## 2021-12-02 DIAGNOSIS — H9203 Otalgia, bilateral: Secondary | ICD-10-CM

## 2021-12-02 DIAGNOSIS — H6983 Other specified disorders of Eustachian tube, bilateral: Secondary | ICD-10-CM

## 2021-12-02 DIAGNOSIS — H6993 Unspecified Eustachian tube disorder, bilateral: Secondary | ICD-10-CM

## 2021-12-02 MED ORDER — FLUTICASONE PROPIONATE 50 MCG/ACT NA SUSP: 1.0000 | Freq: Every day | NASAL | 0 refills | Status: AC

## 2021-12-02 NOTE — ED Provider Notes (Signed)
?[facility] ? ? ? ?CSN: [id number] ?Arrival date & time:[id number] 12/02/21  1319 ? ? ?  ? ?History   ?Chief Complaint ?Chief Complaint  ?Patient presents with  ? Otalgia  ? ? ?HPI ?Angela Nicholson is a 51 y.o. female.  ? ?Patient presents today with a several week history of bilateral otalgia and pressure.  She uses ASL and video interpreter was utilized during this visit.  She reports pain is rated 10 on a 0-10 pain scale, described as pressure, no aggravating relieving factors identified.  She has not tried any over-the-counter medication for symptom management.  She denies any recent swimming or airplane travel.  She is scheduled to see an ENT 12/19/2021 but wanted to make sure there was no infection.  She does have allergies and is currently prescribed Xyzal which she has been taking as prescribed.  She is not currently using Flonase.  Denies any recent illness or additional symptoms including congestion, fever, cough, nausea, vomiting, dizziness. ? ? ?Past Medical History:  ?Diagnosis Date  ? Asthma   ? Barrett esophagus   ? Deaf   ? GERD (gastroesophageal reflux disease)   ? Schizo affective schizophrenia (HCC)   ? ? ?Patient Active Problem List  ? Diagnosis Date Noted  ? Hypoxemia 05/27/2021  ? Aspiration into airway, initial encounter 05/26/2021  ? Asthma   ? Barrett esophagus   ? Hyponatremia   ? Hypocalcemia   ? Asthma with status asthmaticus 07/13/2013  ? Hypothyroidism 07/13/2013  ? Cerebral palsy (HCC) 07/13/2013  ? Schizo affective schizophrenia (HCC) 07/13/2013  ? 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 Anxiety 07/13/2013  ? Status asthmaticus 07/13/2013  ? ? ?History reviewed. No pertinent surgical history. ? ?OB History   ?No obstetric history on file. ?  ? ? ? ?Home Medications   ? ?Prior to Admission medications   ?Medication Sig Start Date End Date Taking? Authorizing Provider  ?fluticasone (FLONASE) 50  MCG/ACT nasal spray Place 1 spray into both nostrils daily. 12/02/21  Yes Sufyan Meidinger, Denny PeonErin K, PA-C  ?acetaminophen (TYLENOL) 500 MG tablet Take 500 mg by mouth every 6 (six) hours as needed (cramps/pain).    [provider]  ?atorvastatin (LIPITOR) 20 MG tablet Take 20 mg by mouth See admin instructions. Take one tablet (20 mg) by mouth daily at 3pm    [provider]  ?Calcium Carbonate-Vitamin D (CALCIUM-D PO) Take 1 Dose by mouth every morning. Liquid - Calcium 1500 mg/ Vitamin D3 600 units    [provider]  ?divalproex (DEPAKOTE) 250 MG DR tablet Take 500 mg by mouth 2 (two) times daily. 8am and 8pm 03/12/21   [provider]  ?guaiFENesin (MUCINEX) 600 MG 12 hr tablet Take 1 tablet (600 mg total) by mouth 2 (two) times daily. 05/28/21   Alwyn RenMathews, Elizabeth G, MD  ?ipratropium-albuterol (DUONEB) 0.5-2.5 (3) MG/3ML SOLN Take 3 mLs by nebulization every 4 (four) hours as needed (shortness of breath/wheezing). 04/11/21   [provider]  ?levocetirizine (XYZAL) 5 MG tablet Take 5 mg by mouth See admin instructions. Take one tablet (5 mg) by mouth every morning or take one tablet of loratadine 10 mg every morning (alternating monthly) 05/15/21   [provider]  ?levothyroxine (SYNTHROID) 150 MCG tablet Take 150 mcg by mouth See admin instructions. Take one tablet (150 mcg) by mouth daily at 3pm 04/11/21   [provider]  ?loratadine (CLARITIN)  10 MG tablet Take 10 mg by mouth See admin instructions. Take one tablet (10 mg) by mouth every morning or take one tablet of levocetirizine 5 mg every morning (alternating monthly) 03/16/21   [provider]  ?mirtazapine (REMERON) 45 MG tablet Take 45 mg by mouth at bedtime. 04/03/21   [provider]  ?montelukast (SINGULAIR) 10 MG tablet Take 10 mg by mouth every evening. 8pm    [provider]  ?naltrexone (DEPADE) 50 MG tablet Take 100 mg by mouth 2 (two) times daily. 8am and 8pm     [provider]  ?norethindrone-ethinyl estradiol (NORTREL 7/7/7) 0.5/0.75/1-35 MG-MCG tablet Take 1 tablet by mouth See admin instructions. Take one tablet by mouth daily at 3pm    [provider]  ?OLANZapine (ZYPREXA) 20 MG tablet Take 20 mg by mouth at bedtime.    [provider]  ?omeprazole (PRILOSEC OTC) 20 MG tablet Take 20 mg by mouth every morning.    [provider]  ?rOPINIRole (REQUIP) 1 MG tablet Take 1 mg by mouth 2 (two) times daily. 8am and 8pm    [provider]  ? ? ?Family History ?History reviewed. No pertinent family history. ? ?Social History ?Social History  ? ?Tobacco Use  ? Smoking status: Never  ? Smokeless tobacco: Never  ?Substance Use Topics  ? Alcohol use: No  ? Drug use: No  ? ? ? ?Allergies   ?Penicillins and Sulfa antibiotics ? ? ?Review of Systems ?Review of Systems  ?Constitutional:  Negative for activity change, appetite change, fatigue and fever.  ?HENT:  Positive for ear pain. Negative for congestion, ear discharge, sinus pressure, sneezing and sore throat.   ?Respiratory:  Negative for cough and shortness of breath.   ?Neurological:  Negative for dizziness, light-headedness and headaches.  ? ? ?Physical Exam ?Triage Vital Signs ?ED Triage Vitals [12/02/21 1421]  ?Enc Vitals Group  ?   BP 124/62  ?   Pulse Rate 86  ?   Resp 18  ?   Temp 98.4 ?F (36.9 ?C)  ?   Temp Source Oral  ?   SpO2 100 %  ?   Weight   ?   Height   ?   Head Circumference   ?   Peak Flow   ?   Pain Score 10  ?   Pain Loc   ?   Pain Edu?   ?   Excl. in GC?   ? ?No data found. ? ?Updated Vital Signs ?BP 124/62 (BP Location: Left Arm)   Pulse 86   Temp 98.4 ?F (36.9 ?C) (Oral)   Resp 18   SpO2 100%  ? ?Visual Acuity ?Right Eye Distance:   ?Left Eye Distance:   ?Bilateral Distance:   ? ?Right Eye Near:   ?Left Eye Near:    ?Bilateral Near:    ? ?Physical Exam ?Vitals reviewed.  ?Constitutional:   ?   General: She is awake. She is not in acute distress. ?    Appearance: Normal appearance. She is well-developed. She is not ill-appearing.  ?   Comments: Very pleasant female appears stated age in no acute distress sitting comfortably in exam room  ?HENT:  ?   Head: Normocephalic and atraumatic.  ?   Right Ear: Ear canal and external ear normal. There is no impacted cerumen. Tympanic membrane is retracted. Tympanic membrane is not erythematous or bulging.  ?   Left Ear: Ear canal and external ear normal. There  is no impacted cerumen. Tympanic membrane is retracted. Tympanic membrane is not erythematous or bulging.  ?   Nose:  ?   Right Sinus: No maxillary sinus tenderness or frontal sinus tenderness.  ?   Left Sinus: No maxillary sinus tenderness or frontal sinus tenderness.  ?   Mouth/Throat:  ?   Pharynx: Uvula midline. Posterior oropharyngeal erythema present. No oropharyngeal exudate.  ?Cardiovascular:  ?   Rate and Rhythm: Normal rate and regular rhythm.  ?   Heart sounds: Normal heart sounds, S1 normal and S2 normal. No murmur heard. ?Pulmonary:  ?   Effort: Pulmonary effort is normal.  ?   Breath sounds: Wheezing present. No rhonchi or rales.  ?   Comments: Mild scattered wheezing ?Psychiatric:     ?   Behavior: Behavior is cooperative.  ? ? ? ?UC Treatments / Results  ?Labs ?(all labs ordered are listed, but only abnormal results are displayed) ?Labs Reviewed - No data to display ? ?EKG ? ? ?Radiology ?No results found. ? ?Procedures ?Procedures (including critical care time) ? ?Medications Ordered in UC ?Medications - No data to display ? ?Initial Impression / Assessment and Plan / UC Course  ?I have reviewed the triage vital signs and the nursing notes. ? ?Pertinent labs & imaging results that were available during my care of the patient were reviewed by me and considered in my medical decision making (see chart for details). ? ?  ? ?No evidence of acute infection on physical exam that would warrant initiation of antibiotics.  No cerumen impaction noted.  Given  retraction of TM suspect eustachian tube dysfunction as etiology of symptoms.  Recommend she continue antihistamines as previously prescribed and add Flonase to medication regimen.  Flonase was sent to pharmacy per her request.

## 2021-12-02 NOTE — ED Triage Notes (Signed)
Per interpreter: Pt c/o bilateral ear pain and pressure x3wks. Denies taking meds.  ?

## 2021-12-02 NOTE — Discharge Instructions (Signed)
Your ears show that your eustachian tube is probably not functioning properly.  Use Flonase (fluticasone) twice daily along with your previously prescribed allergy medicine.  It is very important that you follow-up with ENT as soon as possible.  Please keep your appointment scheduled for several weeks from now.  If you develop any fever, worsening pain, concerning symptoms please return so we can recheck your ears. ?

## 2022-07-13 ENCOUNTER — Ambulatory Visit (INDEPENDENT_AMBULATORY_CARE_PROVIDER_SITE_OTHER): Payer: Medicare Other

## 2022-07-13 ENCOUNTER — Ambulatory Visit
Admission: EM | Admit: 2022-07-13 | Discharge: 2022-07-13 | Disposition: A | Discharge status: Home / Self Care | Payer: Medicare Other | Attending: Urgent Care | Admitting: Urgent Care

## 2022-07-13 DIAGNOSIS — R0902 Hypoxemia: Secondary | ICD-10-CM | POA: Diagnosis present

## 2022-07-13 DIAGNOSIS — U071 COVID-19: Secondary | ICD-10-CM | POA: Insufficient documentation

## 2022-07-13 DIAGNOSIS — R052 Subacute cough: Secondary | ICD-10-CM | POA: Insufficient documentation

## 2022-07-13 DIAGNOSIS — B349 Viral infection, unspecified: Secondary | ICD-10-CM | POA: Diagnosis present

## 2022-07-13 DIAGNOSIS — J4530 Mild persistent asthma, uncomplicated: Secondary | ICD-10-CM | POA: Insufficient documentation

## 2022-07-13 LAB — POCT RAPID STREP A (OFFICE): Rapid Strep A Screen: NEGATIVE

## 2022-07-13 MED ORDER — TRIAMCINOLONE ACETONIDE 40 MG/ML IJ SUSP
40.0000 mg | Freq: Once | INTRAMUSCULAR | Status: AC
  Administered 2022-07-13: 40 mg via INTRAMUSCULAR

## 2022-07-13 MED ORDER — IPRATROPIUM-ALBUTEROL 0.5-2.5 (3) MG/3ML IN SOLN: 3.0000 mL | RESPIRATORY_TRACT | 0 refills | Status: AC | PRN

## 2022-07-13 MED ORDER — IPRATROPIUM-ALBUTEROL 0.5-2.5 (3) MG/3ML IN SOLN
3.0000 mL | Freq: Once | RESPIRATORY_TRACT | Status: AC
  Administered 2022-07-13: 3 mL via RESPIRATORY_TRACT

## 2022-07-13 MED ORDER — PROMETHAZINE-DM 6.25-15 MG/5ML PO SYRP: 2.5000 mL | ORAL_SOLUTION | Freq: Three times a day (TID) | ORAL | 0 refills | Status: DC | PRN

## 2022-07-13 MED ORDER — ALBUTEROL SULFATE (2.5 MG/3ML) 0.083% IN NEBU: 2.5000 mg | INHALATION_SOLUTION | Freq: Once | RESPIRATORY_TRACT | Status: DC

## 2022-07-13 NOTE — Discharge Instructions (Addendum)
We will notify you of your test results as they arrive and may take between about 24 hours.  I encourage you to sign up for MyChart if you have not already done so as this can be the easiest way for [place] to communicate results to you online or through a phone app.  Generally, we only contact you if it is a positive test result.  In the meantime, if you develop worsening symptoms including fever, chest pain, shortness of breath despite our current treatment plan then please report to the emergency room as this may be a sign of worsening status from possible viral infection.  Otherwise, we will manage this as a viral syndrome. For sore throat or cough try using a honey-based tea. Use 3 teaspoons of honey with juice squeezed from half lemon. Place shaved pieces of ginger into 1/2-1 cup of water and warm over stove top. Then mix the ingredients and repeat every 4 hours as needed. Please take Tylenol 500mg -650mg  every 6 hours for aches and pains, fevers. Hydrate very well with at least 2 liters of water. Eat light meals such as soups to replenish electrolytes and soft fruits, veggies. Start an antihistamine like Zyrtec for postnasal drainage, sinus congestion.  You can take this together with your albuterol treatments.  Use the cough medications as needed.   We will let you know about your COVID test results and if you need treatment for this.

## 2022-07-13 NOTE — ED Provider Notes (Signed)
[facility]  Note:  This document was prepared using Conservation officer, historic buildings and may include unintentional dictation errors.  ASL interpreter services were also used in the context of her deaf mutism.  MRN: [id number] DOB: 1970/07/31  Subjective:   Angela Nicholson is a 51 y.o. female presenting for 1 day history of acute onset malaise, coughing, body aches, posterior headache, throat pain, dizziness. No fever. Has a history of asthma.  Has DuoNeb treatments that she can do at home but has not done any today.  She does have a history of hypoxemia.  No current facility-administered medications for this encounter.  Current Outpatient Medications:    acetaminophen (TYLENOL) 500 MG tablet, Take 500 mg by mouth every 6 (six) hours as needed (cramps/pain)., Disp: , Rfl:    atorvastatin (LIPITOR) 20 MG tablet, Take 20 mg by mouth See admin instructions. Take one tablet (20 mg) by mouth daily at 3pm, Disp: , Rfl:    Calcium Carbonate-Vitamin D (CALCIUM-D PO), Take 1 Dose by mouth every morning. Liquid - Calcium 1500 mg/ Vitamin D3 600 units, Disp: , Rfl:    divalproex (DEPAKOTE) 250 MG DR tablet, Take 500 mg by mouth 2 (two) times daily. 8am and 8pm, Disp: , Rfl:    fluticasone (FLONASE) 50 MCG/ACT nasal spray, Place 1 spray into both nostrils daily., Disp: 16 g, Rfl: 0   guaiFENesin (MUCINEX) 600 MG 12 hr tablet, Take 1 tablet (600 mg total) by mouth 2 (two) times daily., Disp: , Rfl:    ipratropium-albuterol (DUONEB) 0.5-2.5 (3) MG/3ML SOLN, Take 3 mLs by nebulization every 4 (four) hours as needed (shortness of breath/wheezing)., Disp: , Rfl:    levocetirizine (XYZAL) 5 MG tablet, Take 5 mg by mouth See admin instructions. Take one tablet (5 mg) by mouth every morning or take one tablet of loratadine 10 mg every morning (alternating monthly), Disp: , Rfl:    levothyroxine (SYNTHROID) 150 MCG tablet, Take 150 mcg by mouth See admin instructions. Take one tablet  (150 mcg) by mouth daily at 3pm, Disp: , Rfl:    loratadine (CLARITIN) 10 MG tablet, Take 10 mg by mouth See admin instructions. Take one tablet (10 mg) by mouth every morning or take one tablet of levocetirizine 5 mg every morning (alternating monthly), Disp: , Rfl:    mirtazapine (REMERON) 45 MG tablet, Take 45 mg by mouth at bedtime., Disp: , Rfl:    montelukast (SINGULAIR) 10 MG tablet, Take 10 mg by mouth every evening. 8pm, Disp: , Rfl:    naltrexone (DEPADE) 50 MG tablet, Take 100 mg by mouth 2 (two) times daily. 8am and 8pm, Disp: , Rfl:    norethindrone-ethinyl estradiol (NORTREL 7/7/7) 0.5/0.75/1-35 MG-MCG tablet, Take 1 tablet by mouth See admin instructions. Take one tablet by mouth daily at 3pm, Disp: , Rfl:    OLANZapine (ZYPREXA) 20 MG tablet, Take 20 mg by mouth at bedtime., Disp: , Rfl:    omeprazole (PRILOSEC OTC) 20 MG tablet, Take 20 mg by mouth every morning., Disp: , Rfl:    rOPINIRole (REQUIP) 1 MG tablet, Take 1 mg by mouth 2 (two) times daily. 8am and 8pm, Disp: , Rfl:    Allergies  Allergen Reactions   Penicillins Rash   Sulfa Antibiotics Rash    Past Medical History:  Diagnosis Date   Asthma    Barrett esophagus    Deaf    GERD (gastroesophageal reflux disease)    Schizo affective schizophrenia (HCC)  History reviewed. No pertinent surgical history.  No family history on file.  Social History   Tobacco Use   Smoking status: Never   Smokeless tobacco: Never  Vaping Use   Vaping Use: Never used  Substance Use Topics   Alcohol use: No   Drug use: No    ROS   Objective:   Vitals: BP (!) 95/53 (BP Location: Right Arm)   Pulse 88   Temp 98.7 F (37.1 C) (Oral)   Resp 20   SpO2 91%   BP Readings from Last 3 Encounters:  07/13/22 (!) 95/53  12/02/21 124/62  05/28/21 118/68   BP 113/69 on recheck.   Physical Exam Constitutional:      General: She is not in acute distress.    Appearance: Normal appearance. She is well-developed and  normal weight. She is not ill-appearing, toxic-appearing or diaphoretic.  HENT:     Head: Normocephalic and atraumatic.     Right Ear: Tympanic membrane, ear canal and external ear normal. No drainage or tenderness. No middle ear effusion. There is no impacted cerumen. Tympanic membrane is not erythematous or bulging.     Left Ear: Tympanic membrane, ear canal and external ear normal. No drainage or tenderness.  No middle ear effusion. There is no impacted cerumen. Tympanic membrane is not erythematous or bulging.     Nose: Congestion present. No rhinorrhea.     Mouth/Throat:     Mouth: Mucous membranes are moist. No oral lesions.     Pharynx: No pharyngeal swelling, oropharyngeal exudate, posterior oropharyngeal erythema or uvula swelling.     Tonsils: No tonsillar exudate or tonsillar abscesses.     Comments: Thick postnasal drainage overlying pharynx. Eyes:     General: No scleral icterus.       Right eye: No discharge.        Left eye: No discharge.     Extraocular Movements: Extraocular movements intact.     Right eye: Normal extraocular motion.     Left eye: Normal extraocular motion.     Conjunctiva/sclera: Conjunctivae normal.  Cardiovascular:     Rate and Rhythm: Normal rate and regular rhythm.     Heart sounds: Normal heart sounds. No murmur heard.    No friction rub. No gallop.  Pulmonary:     Effort: Pulmonary effort is normal. No respiratory distress.     Breath sounds: No stridor. Rhonchi (coarse over mid to lower lung fields bilateral) present. No wheezing or rales.  Chest:     Chest wall: No tenderness.  Musculoskeletal:     Cervical back: Normal range of motion and neck supple.  Lymphadenopathy:     Cervical: No cervical adenopathy.  Skin:    General: Skin is warm and dry.  Neurological:     General: No focal deficit present.     Mental Status: She is alert and oriented to person, place, and time.  Psychiatric:        Mood and Affect: Mood normal.         Behavior: Behavior normal.    DG Chest 2 View  Result Date: 07/13/2022 CLINICAL DATA:  Hypoxia with body aches. EXAM: CHEST - 2 VIEW COMPARISON:  05/26/2021 FINDINGS: The lungs are clear without focal pneumonia, edema, pneumothorax or pleural effusion. The cardiopericardial silhouette is within normal limits for size. The visualized bony structures of the thorax are unremarkable. IMPRESSION: No active cardiopulmonary disease. Electronically Signed   By: Misty Stanley M.D.   On: 07/13/2022 11:17  Duoneb treatment provided in clinic. IM triamcinolone 40mg  in clinic.   Results for orders placed or performed during the hospital encounter of 07/13/22 (from the past 24 hour(s))  POCT rapid strep A     Status: None   Collection Time: 07/13/22 11:36 AM  Result Value Ref Range   Rapid Strep A Screen Negative Negative     Assessment and Plan :   PDMP not reviewed this encounter.  1. Acute viral syndrome   2. Hypoxia   3. Mild persistent asthma without complication   4. Subacute cough     The above measures were used with only slight improvement in the symptoms.  Strep test was negative, low suspicion for strep and therefore we will hold off on the throat culture.  Recommended supportive care.  COVID testing pending.  Patient should undergo treatment with Paxlovid should she test positive for this.  Chest x-ray negative.  Counseled patient on potential for adverse effects with medications prescribed/recommended today, ER and return-to-clinic precautions discussed, patient verbalized understanding.    07/15/22, PA-C 07/13/22 1414

## 2022-07-13 NOTE — ED Triage Notes (Signed)
Pt c/o cough, body aches, URI sx, dizziness started yesterday-mother with pt/using ASL-pt is deaf-pt NAD-steady gait

## 2022-07-14 LAB — SARS CORONAVIRUS 2 (TAT 6-24 HRS): SARS Coronavirus 2: POSITIVE — AB

## 2022-07-15 ENCOUNTER — Telehealth (HOSPITAL_COMMUNITY): Payer: Self-pay | Admitting: Emergency Medicine

## 2022-07-15 MED ORDER — NIRMATRELVIR/RITONAVIR (PAXLOVID)TABLET: 3.0000 | ORAL_TABLET | Freq: Two times a day (BID) | ORAL | 0 refills | Status: AC

## 2022-07-29 ENCOUNTER — Ambulatory Visit: Payer: Medicare Other

## 2022-07-30 ENCOUNTER — Ambulatory Visit
Admission: EM | Admit: 2022-07-30 | Discharge: 2022-07-30 | Disposition: A | Discharge status: Home / Self Care | Payer: 59 | Attending: Urgent Care | Admitting: Urgent Care

## 2022-07-30 ENCOUNTER — Ambulatory Visit (HOSPITAL_BASED_OUTPATIENT_CLINIC_OR_DEPARTMENT_OTHER)
Admission: RE | Admit: 2022-07-30 | Discharge: 2022-07-30 | Disposition: A | Discharge status: Home / Self Care | Payer: 59 | Source: Ambulatory Visit | Attending: Urgent Care | Admitting: Urgent Care

## 2022-07-30 DIAGNOSIS — R059 Cough, unspecified: Secondary | ICD-10-CM | POA: Insufficient documentation

## 2022-07-30 DIAGNOSIS — J4540 Moderate persistent asthma, uncomplicated: Secondary | ICD-10-CM | POA: Diagnosis not present

## 2022-07-30 DIAGNOSIS — Z8616 Personal history of COVID-19: Secondary | ICD-10-CM

## 2022-07-30 DIAGNOSIS — R053 Chronic cough: Secondary | ICD-10-CM

## 2022-07-30 MED ORDER — PROMETHAZINE-DM 6.25-15 MG/5ML PO SYRP: 5.0000 mL | ORAL_SOLUTION | Freq: Three times a day (TID) | ORAL | 0 refills | Status: DC | PRN

## 2022-07-30 NOTE — ED Provider Notes (Signed)
[facility]  Note:  This document was prepared using Systems analyst and may include unintentional dictation errors.  MRN: [id number] DOB: [birth year]  Subjective:   Angela Nicholson is a 52 y.o. female presenting for recheck on persistent coughing. Had COVID 19 from 07/13/2022. Patient did not end up getting Paxlovid as they responded too late to our calls.  She did very well following the steroid injection, has used her breathing treatments.  Would like to have more cough medication.  The cough can be worse at night.  No overt chest pain, body pains.  No current facility-administered medications for this encounter.  Current Outpatient Medications:    acetaminophen (TYLENOL) 500 MG tablet, Take 500 mg by mouth every 6 (six) hours as needed (cramps/pain)., Disp: , Rfl:    atorvastatin (LIPITOR) 20 MG tablet, Take 20 mg by mouth See admin instructions. Take one tablet (20 mg) by mouth daily at 3pm, Disp: , Rfl:    Calcium Carbonate-Vitamin D (CALCIUM-D PO), Take 1 Dose by mouth every morning. Liquid - Calcium 1500 mg/ Vitamin D3 600 units, Disp: , Rfl:    divalproex (DEPAKOTE) 250 MG DR tablet, Take 500 mg by mouth 2 (two) times daily. 8am and 8pm, Disp: , Rfl:    fluticasone (FLONASE) 50 MCG/ACT nasal spray, Place 1 spray into both nostrils daily., Disp: 16 g, Rfl: 0   guaiFENesin (MUCINEX) 600 MG 12 hr tablet, Take 1 tablet (600 mg total) by mouth 2 (two) times daily., Disp: , Rfl:    ipratropium-albuterol (DUONEB) 0.5-2.5 (3) MG/3ML SOLN, Take 3 mLs by nebulization every 4 (four) hours as needed (shortness of breath/wheezing)., Disp: 360 mL, Rfl: 0   levocetirizine (XYZAL) 5 MG tablet, Take 5 mg by mouth See admin instructions. Take one tablet (5 mg) by mouth every morning or take one tablet of loratadine 10 mg every morning (alternating monthly), Disp: , Rfl:    levothyroxine (SYNTHROID) 150 MCG tablet, Take 150 mcg by mouth See admin instructions.  Take one tablet (150 mcg) by mouth daily at 3pm, Disp: , Rfl:    loratadine (CLARITIN) 10 MG tablet, Take 10 mg by mouth See admin instructions. Take one tablet (10 mg) by mouth every morning or take one tablet of levocetirizine 5 mg every morning (alternating monthly), Disp: , Rfl:    mirtazapine (REMERON) 45 MG tablet, Take 45 mg by mouth at bedtime., Disp: , Rfl:    montelukast (SINGULAIR) 10 MG tablet, Take 10 mg by mouth every evening. 8pm, Disp: , Rfl:    naltrexone (DEPADE) 50 MG tablet, Take 100 mg by mouth 2 (two) times daily. 8am and 8pm, Disp: , Rfl:    norethindrone-ethinyl estradiol (NORTREL 7/7/7) 0.5/0.75/1-35 MG-MCG tablet, Take 1 tablet by mouth See admin instructions. Take one tablet by mouth daily at 3pm, Disp: , Rfl:    OLANZapine (ZYPREXA) 20 MG tablet, Take 20 mg by mouth at bedtime., Disp: , Rfl:    omeprazole (PRILOSEC OTC) 20 MG tablet, Take 20 mg by mouth every morning., Disp: , Rfl:    promethazine-dextromethorphan (PROMETHAZINE-DM) 6.25-15 MG/5ML syrup, Take 2.5 mLs by mouth 3 (three) times daily as needed for cough., Disp: 100 mL, Rfl: 0   rOPINIRole (REQUIP) 1 MG tablet, Take 1 mg by mouth 2 (two) times daily. 8am and 8pm, Disp: , Rfl:    Allergies  Allergen Reactions   Penicillins Rash   Sulfa Antibiotics Rash    Past Medical History:  Diagnosis Date  Asthma    Barrett esophagus    Deaf    GERD (gastroesophageal reflux disease)    Schizo affective schizophrenia ([place])      History reviewed. No pertinent surgical history.  History reviewed. No pertinent family history.  Social History   Tobacco Use   Smoking status: Never   Smokeless tobacco: Never  Vaping Use   Vaping Use: Never used  Substance Use Topics   Alcohol use: No   Drug use: No    ROS   Objective:   Vitals: BP 121/68 (BP Location: Right Arm)   Pulse 76   Temp 97.6 F (36.4 C) (Oral)   Resp 20   SpO2 95%   Physical Exam Constitutional:      General: She is not in acute  distress.    Appearance: Normal appearance. She is well-developed. She is not ill-appearing, toxic-appearing or diaphoretic.  HENT:     Head: Normocephalic and atraumatic.     Nose: Nose normal.     Mouth/Throat:     Mouth: Mucous membranes are moist.  Eyes:     General: No scleral icterus.       Right eye: No discharge.        Left eye: No discharge.     Extraocular Movements: Extraocular movements intact.  Cardiovascular:     Rate and Rhythm: Normal rate and regular rhythm.     Heart sounds: Normal heart sounds. No murmur heard.    No friction rub. No gallop.  Pulmonary:     Effort: Pulmonary effort is normal. No respiratory distress.     Breath sounds: No stridor. No wheezing, rhonchi or rales.  Chest:     Chest wall: No tenderness.  Skin:    General: Skin is warm and dry.  Neurological:     General: No focal deficit present.     Mental Status: She is alert and oriented to person, place, and time.  Psychiatric:        Mood and Affect: Mood normal.        Behavior: Behavior normal.     Assessment and Plan :   PDMP not reviewed this encounter.  1. Persistent cough   2. History of COVID-19   3. Moderate persistent asthma without complication     Will pursue the chest x-ray to recheck and make sure she does not have secondary pneumonia to her COVID.  Will hold off on steroids for now.  Continue albuterol treatments.  Refilled her cough syrup.  Will follow-up with the x-ray report from the hospital after they go to [place] and get it. Counseled patient on potential for adverse effects with medications prescribed/recommended today, ER and return-to-clinic precautions discussed, patient verbalized understanding.    Jaynee Eagles, [place] 07/30/22 [id number]

## 2022-07-30 NOTE — ED Notes (Signed)
Pt and mother advised by Bess Harvest, [place] to go [facility] for CXR-xray here is down

## 2022-07-30 NOTE — ED Triage Notes (Addendum)
Per mother/pt is deaf and video ASL interpreter-pt with cont'd cough-pt NAD-gait with own cane

## 2022-09-21 IMAGING — CR DG CHEST 2V
2 series · 2 of 2 positions shown · non-contrast
Comparison: None.

CLINICAL DATA: Aspiration.

EXAM:
CHEST - 2 VIEW

[w chest lat]
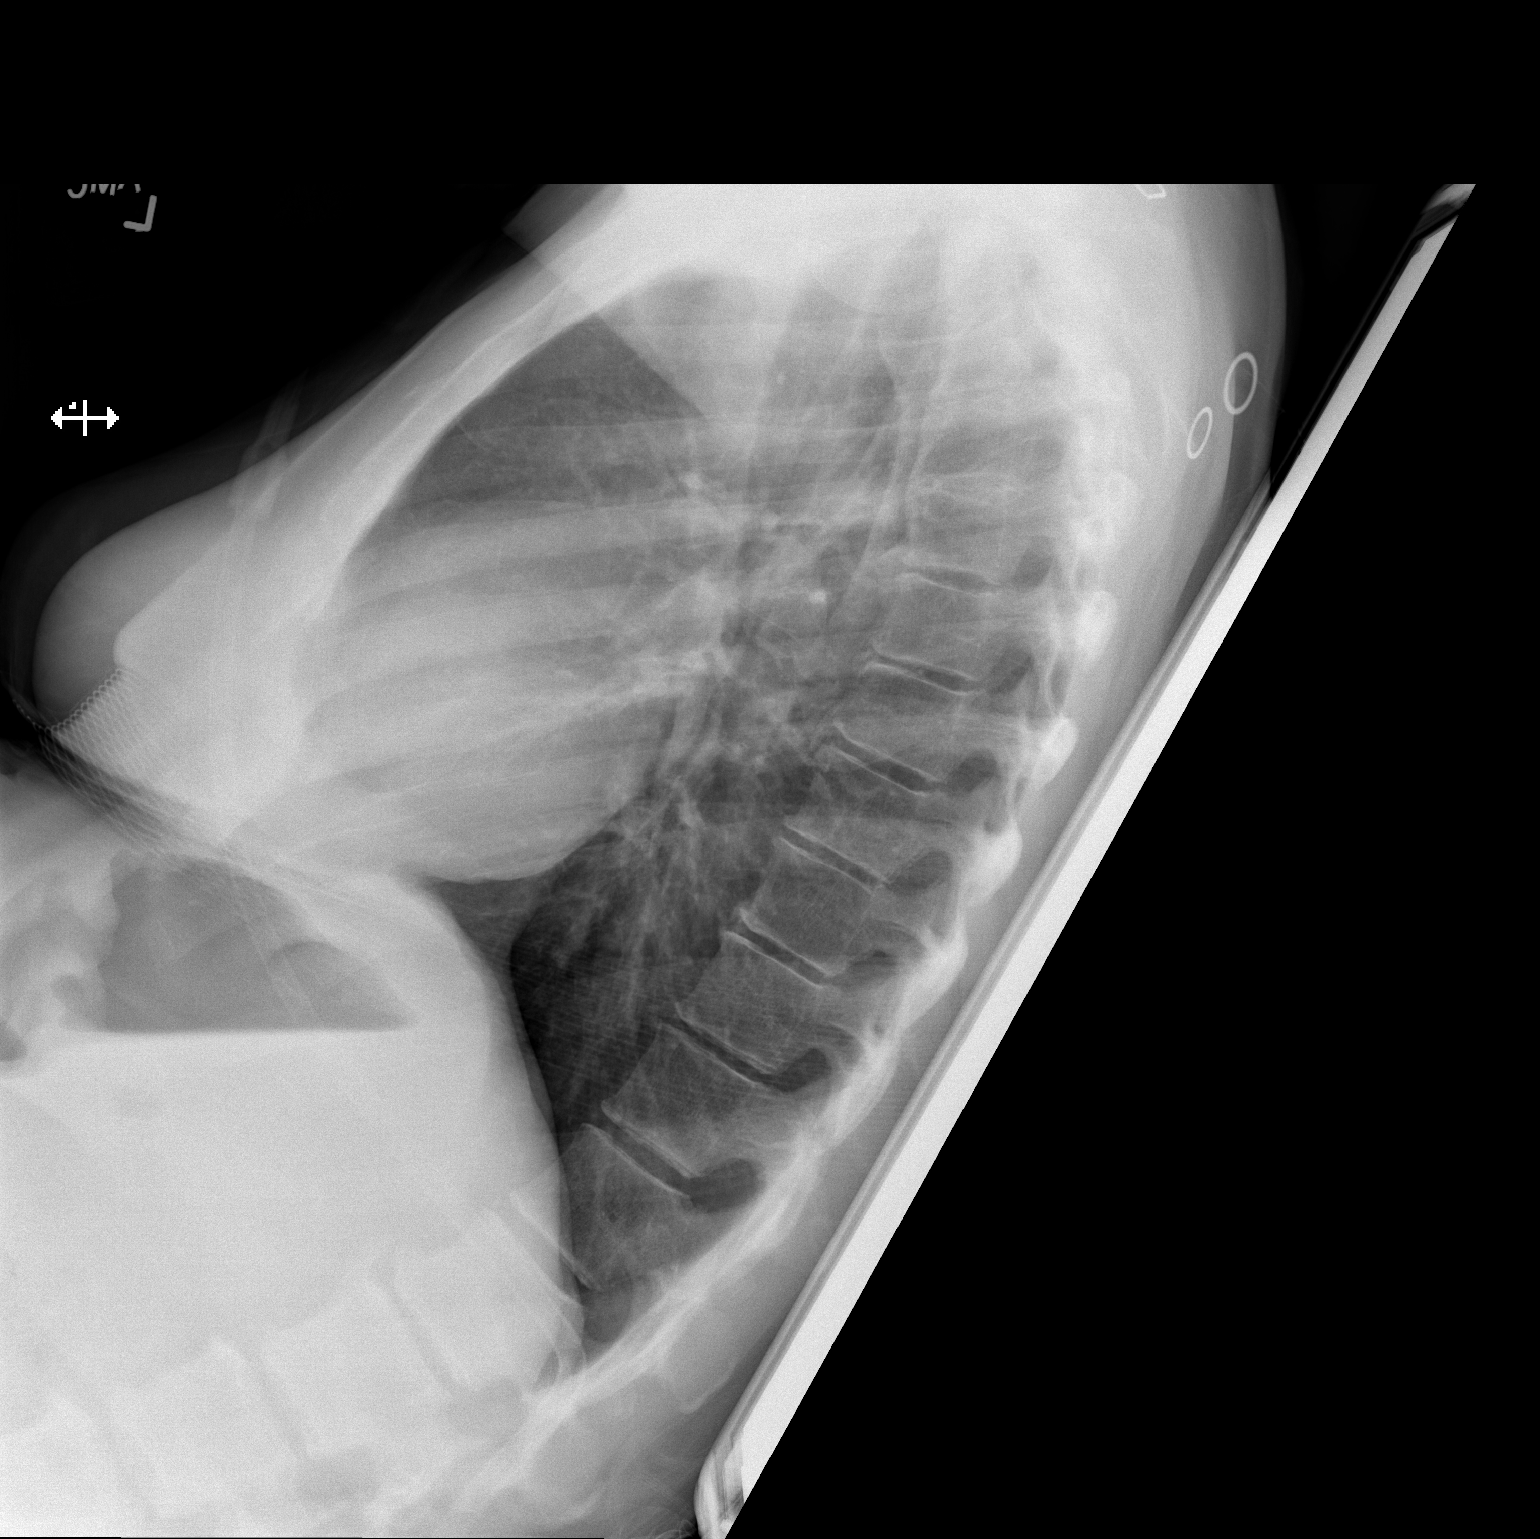

[x chest ap]
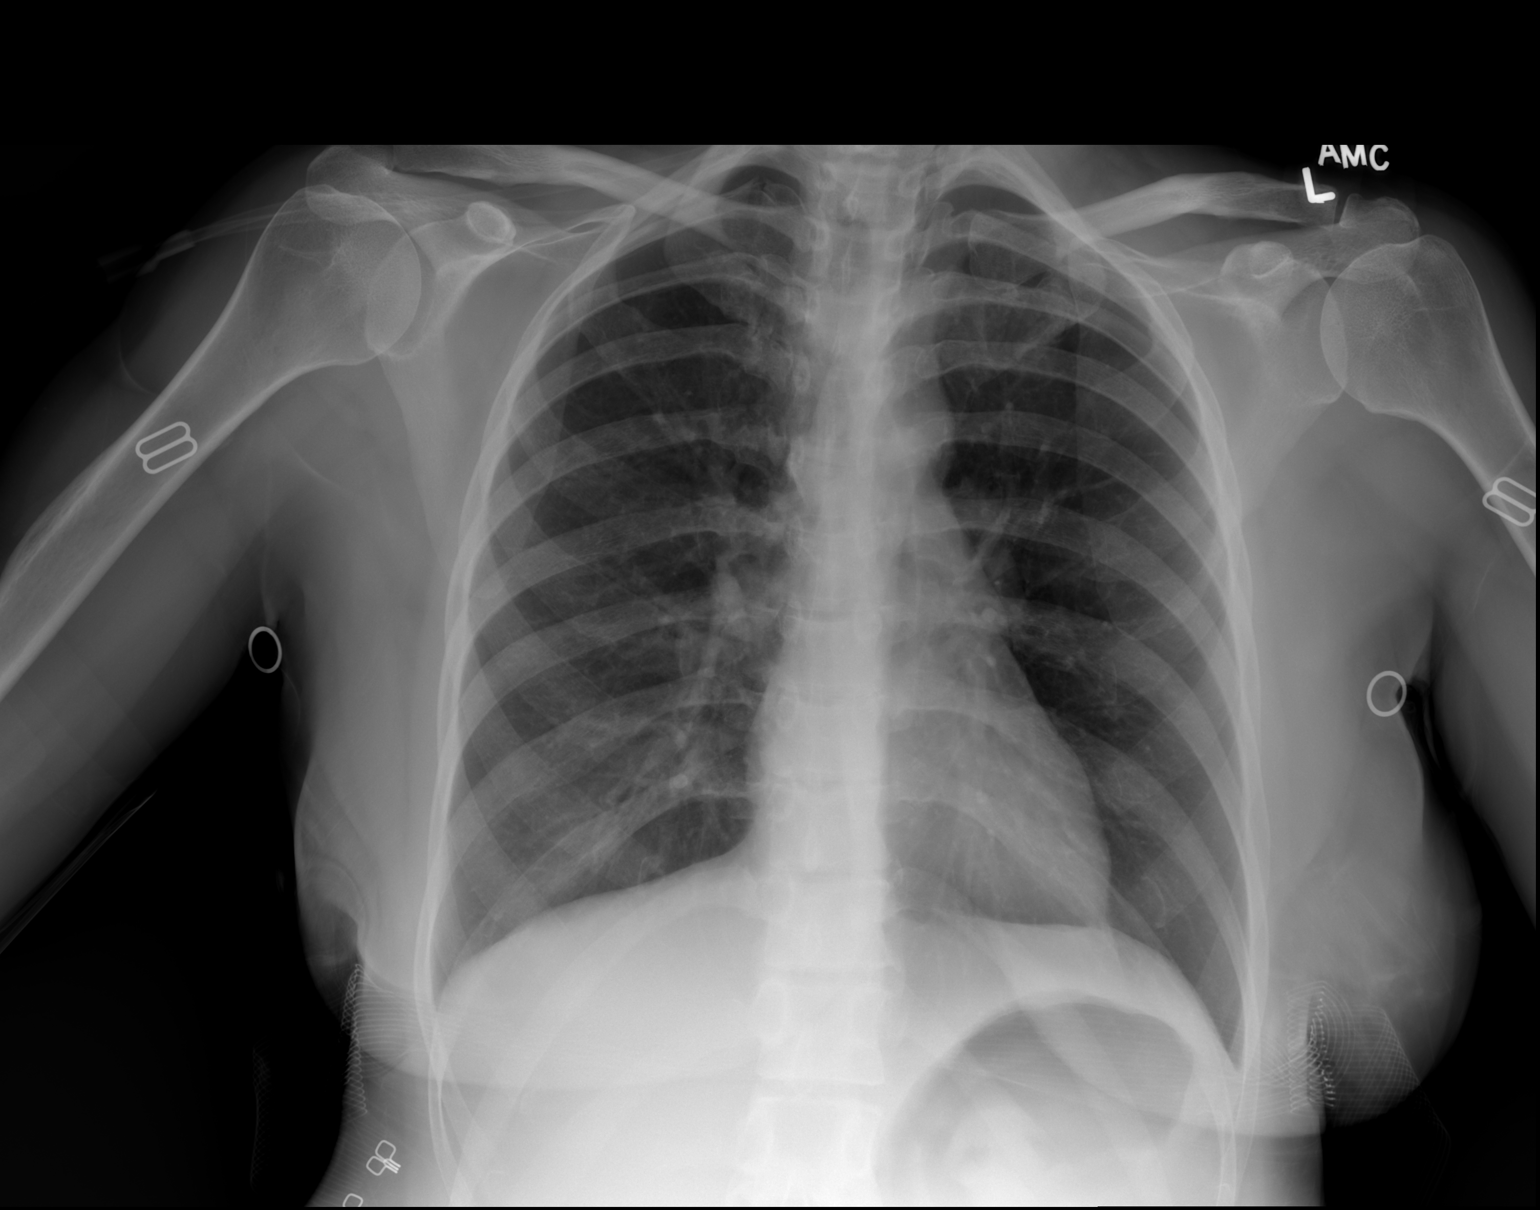

[2 of 2 positions shown; findings below may reference images not displayed]

FINDINGS: The heart size and mediastinal contours are within normal limits.
Both lungs are clear. The visualized skeletal structures are
unremarkable.
IMPRESSION: No active cardiopulmonary disease.

## 2022-09-24 ENCOUNTER — Other Ambulatory Visit: Payer: Self-pay

## 2022-09-24 ENCOUNTER — Other Ambulatory Visit (HOSPITAL_BASED_OUTPATIENT_CLINIC_OR_DEPARTMENT_OTHER): Payer: Self-pay

## 2022-09-24 MED ORDER — NALTREXONE HCL 50 MG PO TABS
100.0000 mg | ORAL_TABLET | Freq: Two times a day (BID) | ORAL | 0 refills | Status: DC
  Filled 2022-09-24 – 2022-09-26 (×3): qty 50, 13d supply, fill #0

## 2022-09-26 ENCOUNTER — Other Ambulatory Visit (HOSPITAL_COMMUNITY): Payer: Self-pay

## 2022-09-26 ENCOUNTER — Other Ambulatory Visit (HOSPITAL_BASED_OUTPATIENT_CLINIC_OR_DEPARTMENT_OTHER): Payer: Self-pay

## 2022-10-13 ENCOUNTER — Other Ambulatory Visit (HOSPITAL_COMMUNITY): Payer: Self-pay

## 2023-05-06 ENCOUNTER — Emergency Department (HOSPITAL_BASED_OUTPATIENT_CLINIC_OR_DEPARTMENT_OTHER): Payer: 59

## 2023-05-06 ENCOUNTER — Inpatient Hospital Stay (HOSPITAL_BASED_OUTPATIENT_CLINIC_OR_DEPARTMENT_OTHER)
Admission: EM | Admit: 2023-05-06 | Discharge: 2023-05-08 | DRG: 193 | Disposition: A | Discharge status: Home / Self Care | Payer: 59 | Attending: Internal Medicine | Admitting: Internal Medicine

## 2023-05-06 ENCOUNTER — Encounter (HOSPITAL_BASED_OUTPATIENT_CLINIC_OR_DEPARTMENT_OTHER): Payer: Self-pay | Admitting: Emergency Medicine

## 2023-05-06 DIAGNOSIS — E871 Hypo-osmolality and hyponatremia: Secondary | ICD-10-CM | POA: Diagnosis present

## 2023-05-06 DIAGNOSIS — R0902 Hypoxemia: Secondary | ICD-10-CM

## 2023-05-06 DIAGNOSIS — J9601 Acute respiratory failure with hypoxia: Secondary | ICD-10-CM | POA: Diagnosis present

## 2023-05-06 DIAGNOSIS — K2270 Barrett's esophagus without dysplasia: Secondary | ICD-10-CM | POA: Diagnosis present

## 2023-05-06 DIAGNOSIS — H913 Deaf nonspeaking, not elsewhere classified: Secondary | ICD-10-CM | POA: Diagnosis present

## 2023-05-06 DIAGNOSIS — Z79890 Hormone replacement therapy (postmenopausal): Secondary | ICD-10-CM

## 2023-05-06 DIAGNOSIS — Z1152 Encounter for screening for COVID-19: Secondary | ICD-10-CM

## 2023-05-06 DIAGNOSIS — E039 Hypothyroidism, unspecified: Secondary | ICD-10-CM | POA: Diagnosis present

## 2023-05-06 DIAGNOSIS — F259 Schizoaffective disorder, unspecified: Secondary | ICD-10-CM | POA: Diagnosis present

## 2023-05-06 DIAGNOSIS — J45909 Unspecified asthma, uncomplicated: Secondary | ICD-10-CM | POA: Diagnosis present

## 2023-05-06 DIAGNOSIS — J189 Pneumonia, unspecified organism: Secondary | ICD-10-CM | POA: Diagnosis not present

## 2023-05-06 DIAGNOSIS — G809 Cerebral palsy, unspecified: Secondary | ICD-10-CM | POA: Diagnosis present

## 2023-05-06 DIAGNOSIS — Z79899 Other long term (current) drug therapy: Secondary | ICD-10-CM

## 2023-05-06 DIAGNOSIS — Z8616 Personal history of COVID-19: Secondary | ICD-10-CM

## 2023-05-06 DIAGNOSIS — K219 Gastro-esophageal reflux disease without esophagitis: Secondary | ICD-10-CM | POA: Diagnosis present

## 2023-05-06 LAB — COMPREHENSIVE METABOLIC PANEL
ALT: 29 U/L (ref 0–44)
AST: 29 U/L (ref 15–41)
Albumin: 3.6 g/dL (ref 3.5–5.0)
Alkaline Phosphatase: 47 U/L (ref 38–126)
Anion gap: 13 (ref 5–15)
BUN: 17 mg/dL (ref 6–20)
CO2: 29 mmol/L (ref 22–32)
Calcium: 9.5 mg/dL (ref 8.9–10.3)
Chloride: 83 mmol/L — ABNORMAL LOW (ref 98–111)
Creatinine, Ser: 0.8 mg/dL (ref 0.44–1.00)
GFR, Estimated: >60 mL/min (ref >60)
Glucose, Bld: 96 mg/dL (ref 70–99)
Potassium: 3.8 mmol/L (ref 3.5–5.1)
Sodium: 125 mmol/L — ABNORMAL LOW (ref 135–145)
Total Bilirubin: 0.7 mg/dL (ref 0.3–1.2)
Total Protein: 6.9 g/dL (ref 6.5–8.1)

## 2023-05-06 LAB — CBC WITH DIFFERENTIAL/PLATELET
Abs Immature Granulocytes: 0.05 10*3/uL (ref 0.00–0.07)
Basophils Absolute: 0 10*3/uL (ref 0.0–0.1)
Basophils Relative: 0 %
Eosinophils Absolute: 0.1 10*3/uL (ref 0.0–0.5)
Eosinophils Relative: 0 %
HCT: 36.3 % (ref 36.0–46.0)
Hemoglobin: 12 g/dL (ref 12.0–15.0)
Immature Granulocytes: 0 %
Lymphocytes Relative: 17 %
Lymphs Abs: 2.3 10*3/uL (ref 0.7–4.0)
MCH: 26.7 pg (ref 26.0–34.0)
MCHC: 33.1 g/dL (ref 30.0–36.0)
MCV: 80.7 fL (ref 80.0–100.0)
Monocytes Absolute: 1 10*3/uL (ref 0.1–1.0)
Monocytes Relative: 8 %
Neutro Abs: 10.4 10*3/uL — ABNORMAL HIGH (ref 1.7–7.7)
Neutrophils Relative %: 75 %
Platelets: 265 10*3/uL (ref 150–400)
RBC: 4.5 MIL/uL (ref 3.87–5.11)
RDW: 14.2 % (ref 11.5–15.5)
WBC: 13.9 10*3/uL — ABNORMAL HIGH (ref 4.0–10.5)
nRBC: 0 % (ref 0.0–0.2)

## 2023-05-06 LAB — RESP PANEL BY RT-PCR (RSV, FLU A&B, COVID)  RVPGX2
Influenza A by PCR: NEGATIVE
Influenza B by PCR: NEGATIVE
Resp Syncytial Virus by PCR: NEGATIVE
SARS Coronavirus 2 by RT PCR: NEGATIVE

## 2023-05-06 LAB — LACTIC ACID, PLASMA: Lactic Acid, Venous: 0.6 mmol/L (ref 0.5–1.9)

## 2023-05-06 NOTE — ED Triage Notes (Signed)
Pt was seen by PCP today for fever and cough; had Covid 2 wks ago; O2 sats were 89% in office; NAD at this time

## 2023-05-07 ENCOUNTER — Other Ambulatory Visit: Payer: Self-pay

## 2023-05-07 DIAGNOSIS — Z8616 Personal history of COVID-19: Secondary | ICD-10-CM | POA: Diagnosis not present

## 2023-05-07 DIAGNOSIS — Z79899 Other long term (current) drug therapy: Secondary | ICD-10-CM | POA: Diagnosis not present

## 2023-05-07 DIAGNOSIS — E039 Hypothyroidism, unspecified: Secondary | ICD-10-CM | POA: Diagnosis present

## 2023-05-07 DIAGNOSIS — J9601 Acute respiratory failure with hypoxia: Secondary | ICD-10-CM | POA: Insufficient documentation

## 2023-05-07 DIAGNOSIS — K219 Gastro-esophageal reflux disease without esophagitis: Secondary | ICD-10-CM | POA: Diagnosis present

## 2023-05-07 DIAGNOSIS — F259 Schizoaffective disorder, unspecified: Secondary | ICD-10-CM | POA: Diagnosis present

## 2023-05-07 DIAGNOSIS — Z79890 Hormone replacement therapy (postmenopausal): Secondary | ICD-10-CM | POA: Diagnosis not present

## 2023-05-07 DIAGNOSIS — J45909 Unspecified asthma, uncomplicated: Secondary | ICD-10-CM | POA: Diagnosis present

## 2023-05-07 DIAGNOSIS — J189 Pneumonia, unspecified organism: Secondary | ICD-10-CM | POA: Diagnosis present

## 2023-05-07 DIAGNOSIS — E871 Hypo-osmolality and hyponatremia: Secondary | ICD-10-CM

## 2023-05-07 DIAGNOSIS — H913 Deaf nonspeaking, not elsewhere classified: Secondary | ICD-10-CM | POA: Diagnosis present

## 2023-05-07 DIAGNOSIS — K2270 Barrett's esophagus without dysplasia: Secondary | ICD-10-CM | POA: Diagnosis present

## 2023-05-07 DIAGNOSIS — R0902 Hypoxemia: Secondary | ICD-10-CM | POA: Diagnosis present

## 2023-05-07 DIAGNOSIS — G804 Ataxic cerebral palsy: Secondary | ICD-10-CM

## 2023-05-07 DIAGNOSIS — K22719 Barrett's esophagus with dysplasia, unspecified: Secondary | ICD-10-CM | POA: Diagnosis not present

## 2023-05-07 DIAGNOSIS — G809 Cerebral palsy, unspecified: Secondary | ICD-10-CM | POA: Diagnosis present

## 2023-05-07 DIAGNOSIS — Z1152 Encounter for screening for COVID-19: Secondary | ICD-10-CM | POA: Diagnosis not present

## 2023-05-07 LAB — LACTIC ACID, PLASMA: Lactic Acid, Venous: 0.9 mmol/L (ref 0.5–1.9)

## 2023-05-07 LAB — HIV ANTIBODY (ROUTINE TESTING W REFLEX): HIV Screen 4th Generation wRfx: NONREACTIVE

## 2023-05-07 MED ORDER — ACETAMINOPHEN 650 MG RE SUPP: 650.0000 mg | Freq: Four times a day (QID) | RECTAL | Status: DC | PRN

## 2023-05-07 MED ORDER — ONDANSETRON HCL 4 MG PO TABS: 4.0000 mg | ORAL_TABLET | Freq: Four times a day (QID) | ORAL | Status: DC | PRN

## 2023-05-07 MED ORDER — ACETAMINOPHEN 325 MG PO TABS: 650.0000 mg | ORAL_TABLET | Freq: Four times a day (QID) | ORAL | Status: DC | PRN

## 2023-05-07 MED ORDER — ONDANSETRON HCL 4 MG/2ML IJ SOLN: 4.0000 mg | Freq: Four times a day (QID) | INTRAMUSCULAR | Status: DC | PRN

## 2023-05-07 MED ORDER — ENOXAPARIN SODIUM 40 MG/0.4ML IJ SOSY: 40.0000 mg | PREFILLED_SYRINGE | INTRAMUSCULAR | Status: DC

## 2023-05-07 MED ORDER — FLUTICASONE PROPIONATE 50 MCG/ACT NA SUSP
1.0000 | Freq: Every day | NASAL | Status: DC
  Administered 2023-05-07 – 2023-05-08 (×2): 1 via NASAL
  Filled 2023-05-07: qty 16

## 2023-05-07 MED ORDER — MIRTAZAPINE 15 MG PO TABS
45.0000 mg | ORAL_TABLET | Freq: Every day | ORAL | Status: DC
  Administered 2023-05-07: 45 mg via ORAL
  Filled 2023-05-07: qty 3

## 2023-05-07 MED ORDER — IPRATROPIUM-ALBUTEROL 0.5-2.5 (3) MG/3ML IN SOLN: 3.0000 mL | RESPIRATORY_TRACT | Status: DC | PRN

## 2023-05-07 MED ORDER — SODIUM CHLORIDE 0.9 % IV SOLN
1.0000 g | Freq: Once | INTRAVENOUS | Status: AC
  Administered 2023-05-07: 1 g via INTRAVENOUS
  Filled 2023-05-07: qty 10

## 2023-05-07 MED ORDER — ROPINIROLE HCL 1 MG PO TABS
1.0000 mg | ORAL_TABLET | Freq: Two times a day (BID) | ORAL | Status: DC
  Administered 2023-05-07 – 2023-05-08 (×3): 1 mg via ORAL
  Filled 2023-05-07 (×4): qty 1

## 2023-05-07 MED ORDER — SODIUM CHLORIDE 0.9 % IV SOLN
1.0000 g | INTRAVENOUS | Status: DC
Start: 2023-05-07 — End: 2023-05-07

## 2023-05-07 MED ORDER — AZITHROMYCIN 500 MG PO TABS: 500.0000 mg | ORAL_TABLET | Freq: Every day | ORAL | Status: DC

## 2023-05-07 MED ORDER — PANTOPRAZOLE SODIUM 20 MG PO TBEC
40.0000 mg | DELAYED_RELEASE_TABLET | Freq: Every morning | ORAL | Status: DC
  Administered 2023-05-07 – 2023-05-08 (×2): 40 mg via ORAL
  Filled 2023-05-07 (×2): qty 2

## 2023-05-07 MED ORDER — ALBUTEROL SULFATE (2.5 MG/3ML) 0.083% IN NEBU: 2.5000 mg | INHALATION_SOLUTION | RESPIRATORY_TRACT | Status: DC | PRN

## 2023-05-07 MED ORDER — OLANZAPINE 10 MG PO TABS
20.0000 mg | ORAL_TABLET | Freq: Every day | ORAL | Status: DC
  Administered 2023-05-07: 20 mg via ORAL
  Filled 2023-05-07 (×2): qty 2

## 2023-05-07 MED ORDER — GUAIFENESIN ER 600 MG PO TB12
600.0000 mg | ORAL_TABLET | Freq: Two times a day (BID) | ORAL | Status: DC
  Administered 2023-05-07 – 2023-05-08 (×3): 600 mg via ORAL
  Filled 2023-05-07 (×3): qty 1

## 2023-05-07 MED ORDER — SODIUM CHLORIDE 0.9 % IV SOLN
1.0000 g | INTRAVENOUS | Status: DC
  Administered 2023-05-07: 1 g via INTRAVENOUS
  Filled 2023-05-07: qty 10

## 2023-05-07 MED ORDER — IPRATROPIUM-ALBUTEROL 0.5-2.5 (3) MG/3ML IN SOLN
3.0000 mL | Freq: Four times a day (QID) | RESPIRATORY_TRACT | Status: DC
  Administered 2023-05-07 – 2023-05-08 (×5): 3 mL via RESPIRATORY_TRACT
  Filled 2023-05-07 (×5): qty 3

## 2023-05-07 MED ORDER — AZITHROMYCIN 500 MG PO TABS
500.0000 mg | ORAL_TABLET | Freq: Every day | ORAL | Status: DC
  Administered 2023-05-08: 500 mg via ORAL
  Filled 2023-05-07: qty 1

## 2023-05-07 MED ORDER — HYDROXYZINE HCL 25 MG PO TABS
25.0000 mg | ORAL_TABLET | Freq: Once | ORAL | Status: AC
  Administered 2023-05-07: 25 mg via ORAL
  Filled 2023-05-07: qty 1

## 2023-05-07 MED ORDER — NALTREXONE HCL 50 MG PO TABS
100.0000 mg | ORAL_TABLET | Freq: Two times a day (BID) | ORAL | Status: DC
  Administered 2023-05-07 – 2023-05-08 (×3): 100 mg via ORAL
  Filled 2023-05-07 (×4): qty 2

## 2023-05-07 MED ORDER — DIVALPROEX SODIUM 500 MG PO DR TAB
500.0000 mg | DELAYED_RELEASE_TABLET | Freq: Two times a day (BID) | ORAL | Status: DC
  Administered 2023-05-07 – 2023-05-08 (×3): 500 mg via ORAL
  Filled 2023-05-07 (×4): qty 1

## 2023-05-07 MED ORDER — NALTREXONE HCL 50 MG PO TABS: 100.0000 mg | ORAL_TABLET | Freq: Two times a day (BID) | ORAL | Status: DC

## 2023-05-07 MED ORDER — MONTELUKAST SODIUM 10 MG PO TABS
10.0000 mg | ORAL_TABLET | Freq: Every evening | ORAL | Status: DC
  Administered 2023-05-07: 10 mg via ORAL
  Filled 2023-05-07: qty 1

## 2023-05-07 MED ORDER — ATORVASTATIN CALCIUM 10 MG PO TABS
20.0000 mg | ORAL_TABLET | Freq: Every day | ORAL | Status: DC
Start: 2023-05-07 — End: 2023-05-08
  Administered 2023-05-07 – 2023-05-08 (×2): 20 mg via ORAL
  Filled 2023-05-07 (×2): qty 2

## 2023-05-07 MED ORDER — LORATADINE 10 MG PO TABS
10.0000 mg | ORAL_TABLET | Freq: Every day | ORAL | Status: DC
  Administered 2023-05-07 – 2023-05-08 (×2): 10 mg via ORAL
  Filled 2023-05-07 (×2): qty 1

## 2023-05-07 MED ORDER — PANTOPRAZOLE SODIUM 40 MG PO TBEC
40.0000 mg | DELAYED_RELEASE_TABLET | Freq: Every day | ORAL | Status: DC
  Filled 2023-05-07: qty 1

## 2023-05-07 MED ORDER — SODIUM CHLORIDE 0.9 % IV SOLN
500.0000 mg | Freq: Once | INTRAVENOUS | Status: AC
  Administered 2023-05-07: 500 mg via INTRAVENOUS
  Filled 2023-05-07: qty 5

## 2023-05-07 MED ORDER — LEVOCETIRIZINE DIHYDROCHLORIDE 5 MG PO TABS: 5.0000 mg | ORAL_TABLET | ORAL | Status: DC

## 2023-05-07 MED ORDER — PANTOPRAZOLE SODIUM 40 MG PO TBEC
40.0000 mg | DELAYED_RELEASE_TABLET | Freq: Every day | ORAL | Status: DC
  Administered 2023-05-07 (×2): 40 mg via ORAL
  Filled 2023-05-07: qty 1

## 2023-05-07 MED ORDER — LEVOTHYROXINE SODIUM 75 MCG PO TABS
150.0000 ug | ORAL_TABLET | Freq: Every day | ORAL | Status: DC
  Administered 2023-05-07 – 2023-05-08 (×2): 150 ug via ORAL
  Filled 2023-05-07 (×2): qty 2

## 2023-05-07 NOTE — Assessment & Plan Note (Signed)
Continue antiacid therapy with proton pump inhibitors.  ?

## 2023-05-07 NOTE — Assessment & Plan Note (Signed)
No signs of acute exacerbation, Continue bronchodilator therapy.

## 2023-05-07 NOTE — ED Notes (Signed)
Called [facility] for transport  No Current ETA... ED Nurse will call floor for report  Called @ 01:16 am

## 2023-05-07 NOTE — Plan of Care (Signed)
  Problem: Education: Goal: Knowledge of General Education information will improve Description: Including pain rating scale, medication(s)/side effects and non-pharmacologic comfort measures Outcome: Progressing   Problem: Health Behavior/Discharge Planning: Goal: Ability to manage health-related needs will improve Outcome: Progressing   Problem: Activity: Goal: Risk for activity intolerance will decrease Outcome: Progressing   Problem: Nutrition: Goal: Adequate nutrition will be maintained Outcome: Progressing   Problem: Elimination: Goal: Will not experience complications related to urinary retention Outcome: Progressing   Problem: Pain Managment: Goal: General experience of comfort will improve Outcome: Progressing   Problem: Skin Integrity: Goal: Risk for impaired skin integrity will decrease Outcome: Progressing   Problem: Clinical Measurements: Goal: Respiratory complications will improve Outcome: Not Progressing   Problem: Coping: Goal: Level of anxiety will decrease Outcome: Not Progressing

## 2023-05-07 NOTE — Assessment & Plan Note (Signed)
Patient will continue with olanzapine, naltrexone, mirtazapine.

## 2023-05-07 NOTE — ED Provider Notes (Signed)
Emergency Department Provider Note   I have reviewed the triage vital signs and the nursing notes.   HISTORY  Chief Complaint Hypoxia  Video ASL interpreter used for this encounter  HPI Angela Nicholson is a 52 y.o. female past history reviewed below presents emergency department the office with cough, subjective fever, fatigue, hypoxemia.  Was diagnosed with COVID 2 weeks prior completed a course of Paxlovid.  Family bedside states that she had improved until today she developed cough and fever.  She went to the primary care physician office and was found to have oxygen saturation in the high 80s. SOB is worse with exertion. Patient with a history of aspiration PNA with brief admit in 2022. No vomiting. Some diarrhea noted.    Past Medical History:  Diagnosis Date   Asthma    Barrett esophagus    Deaf    GERD (gastroesophageal reflux disease)    Schizo affective schizophrenia (HCC)     Review of Systems  Constitutional: Positive fever/chills Cardiovascular: Denies chest pain. Respiratory: Positive shortness of breath and cough.  Gastrointestinal: No abdominal pain.  No nausea, no vomiting.  Musculoskeletal: Negative for back pain. Skin: Negative for rash. Neurological: Negative for headaches.  ____________________________________________   PHYSICAL EXAM:  VITAL SIGNS: ED Triage Vitals  Encounter Vitals Group     BP 05/06/23 1750 112/82     Pulse Rate 05/06/23 1750 92     Resp 05/06/23 1750 20     Temp 05/06/23 1750 97.8 F (36.6 C)     Temp Source 05/06/23 1750 Oral     SpO2 05/06/23 1750 95 %     Weight 05/06/23 1751 111 lb 3.2 oz (50.4 kg)     Height 05/06/23 1751 4\' 11"  (1.499 m)   Constitutional: Alert and oriented. Well appearing and in no acute distress. Eyes: Conjunctivae are normal.  Head: Atraumatic. Nose: No congestion/rhinnorhea. Mouth/Throat: Mucous membranes are moist. Neck: No stridor.   Cardiovascular: Normal rate, regular rhythm. Good  peripheral circulation. Grossly normal heart sounds.   Respiratory: Normal respiratory effort.  No retractions. Lungs with crackles at the bases.  Gastrointestinal: Soft and nontender. No distention.  Musculoskeletal: No gross deformities of extremities. Neurologic:  Normal speech and language.  Skin:  Skin is warm, dry and intact. No rash noted.  ____________________________________________   LABS (all labs ordered are listed, but only abnormal results are displayed)  Labs Reviewed  COMPREHENSIVE METABOLIC PANEL - Abnormal; Notable for the following components:      Result Value   Sodium 125 (*)    Chloride 83 (*)    All other components within normal limits  CBC WITH DIFFERENTIAL/PLATELET - Abnormal; Notable for the following components:   WBC 13.9 (*)    Neutro Abs 10.4 (*)    All other components within normal limits  RESP PANEL BY RT-PCR (RSV, FLU A&B, COVID)  [id number]  LACTIC ACID, PLASMA  LACTIC ACID, PLASMA    ____________________________________________  RADIOLOGY  DG Chest 2 View  Result Date: 05/06/2023 CLINICAL DATA:  Hypoxia, fever EXAM: CHEST - 2 VIEW COMPARISON:  07/30/2022 FINDINGS: Patchy opacity in the lingula concerning for pneumonia. Right lung clear. No effusions. Heart and mediastinal contours within normal limits. No acute bony abnormality. IMPRESSION: Patchy lingular opacity concerning for pneumonia. Electronically Signed   By: Charlett Nose M.D.   On: 05/06/2023 21:38    ____________________________________________   PROCEDURES  Procedure(s) performed:   Procedures  CRITICAL CARE Performed by: Maia Plan Total critical  care time: 35 minutes Critical care time was exclusive of separately billable procedures and treating other patients. Critical care was necessary to treat or prevent imminent or life-threatening deterioration. Critical care was time spent personally by me on the following activities: development of treatment plan with patient  and/or surrogate as well as nursing, discussions with consultants, evaluation of patient's response to treatment, examination of patient, obtaining history from patient or surrogate, ordering and performing treatments and interventions, ordering and review of laboratory studies, ordering and review of radiographic studies, pulse oximetry and re-evaluation of patient's condition.  Alona Bene, MD Emergency Medicine  ____________________________________________   INITIAL IMPRESSION / ASSESSMENT AND PLAN / ED COURSE  Pertinent labs & imaging results that were available during my care of the patient were reviewed by me and considered in my medical decision making (see chart for details).   This patient is Presenting for Evaluation of SOB, which does require a range of treatment options, and is a complaint that involves a high risk of morbidity and mortality.  The Differential Diagnoses include CAP, COVID, Flu, RSV, CHF, PE, etc.  Critical Interventions-    Medications  albuterol (PROVENTIL) (2.5 MG/3ML) 0.083% nebulizer solution 2.5 mg (has no administration in time range)  pantoprazole (PROTONIX) EC tablet 40 mg (40 mg Oral Given 05/07/23 0116)  cefTRIAXone (ROCEPHIN) 1 g in sodium chloride 0.9 % 100 mL IVPB (0 g Intravenous Stopped 05/07/23 0107)  azithromycin (ZITHROMAX) 500 mg in sodium chloride 0.9 % 250 mL IVPB (500 mg Intravenous New Bag/Given 05/07/23 0111)  hydrOXYzine (ATARAX) tablet 25 mg (25 mg Oral Given 05/07/23 0117)    Reassessment after intervention: symptoms improved.    I did obtain Additional Historical Information from family at bedside.   I decided to review pertinent External Data, and in summary aspiration PNA admit in 2022 with Rocephin given during that admit without documented issue with this medication.   Clinical Laboratory Tests Ordered, included CBC with leukocytosis to 13.9.  Hyponatremia at 125 with no AKI.  Lactic acid normal.   COVID-negative.  Radiologic Tests Ordered, included CXR. I independently interpreted the images and agree with radiology interpretation.   Cardiac Monitor Tracing which shows NSR.    Social Determinants of Health Risk patient is a non-smoker.   Consult complete with TRH, plan for admit.   Medical Decision Making: Summary:  Presents emergency department with cough, fever, hypoxemia.  In the ED she is not hypoxemic but with ambulation she desaturates to 87% on room air, increased work of breathing. Recovers with rest.   Reevaluation with update and discussion with patient and family at bedside. With hypoxemia, plan for admit for abx and close monitoring.    Patient's presentation is most consistent with acute presentation with potential threat to life or bodily function.   Disposition: admit  ____________________________________________  FINAL CLINICAL IMPRESSION(S) / ED DIAGNOSES  Final diagnoses:  Pneumonia of lower lobe due to infectious organism, unspecified laterality  Hypoxemia    Note:  This document was prepared using Dragon voice recognition software and may include unintentional dictation errors.  Alona Bene, MD, [facility] Emergency Medicine    Angelynn Lemus, Arlyss Repress, MD 05/07/23 [phone]

## 2023-05-07 NOTE — Assessment & Plan Note (Signed)
Continue levothyroxine 

## 2023-05-07 NOTE — Assessment & Plan Note (Addendum)
Left lingular community acquired pneumonia.   Patient was placed on antibiotic therapy with IV ceftriaxone and oral azithromycin. Bronchodilator therapy, and airway clearing techniques with flutter valve and incentive spirometer.  Transition to oral cefdinir and azithromycin to complete 5 days.  Follow up with speech therapy for swallow evaluation.   Acute hypoxemic respiratory failure.  Her dyspnea and oxygenation have improved.  At the time of her discharge she will have a ambulatory oxymetry on room air.

## 2023-05-07 NOTE — Assessment & Plan Note (Addendum)
Renal function continue to be stable at 0,79, with K at 4,9 and serum bicarbonate at 34. Na 137.

## 2023-05-07 NOTE — Assessment & Plan Note (Signed)
Interpreter needed. Her mother is at the bedside.

## 2023-05-07 NOTE — H&P (Signed)
History and Physical    Patient: Angela Nicholson LKG:[id number] DOB: 09-06-1970 DOA: 05/06/2023 DOS: the patient was seen and examined on 05/07/2023 PCP: Corrington, Meredith Mody, MD  Patient coming from: Home  Chief Complaint:  Chief Complaint  Patient presents with   Hypoxia   HPI: Angela Nicholson is a 52 y.o. female with medical history significant of asthma, barrett esophagus, schizoaffective schizophrenia who presented with dyspnea and fever.  Patient was diagnosed with COVID 19 infection about 3 weeks ago and was treated with antiviral therapy with improvement in her symptoms.  Last night she had acute dyspnea, and fever, up to 100,2 F. Her mother took her to primary care provider, she was found hypoxic down to 89% on room air and was referred to the ED for further evaluation.   On direct questioning patient is feeling better, her dyspnea has been improving with supplemental 02 per [place]. At home apparently was severe in intensity and associated with cough, worsening factors.  Information is obtained with the help of her mother using sign language.   In the past patient had recurrent pneumonias related to aspiration, to the point that she had a PEG tube placed with improvement of her recurrent infections.  Currently patient is eating per mouth.    Review of Systems: As mentioned in the history of present illness. All other systems reviewed and are negative. Past Medical History:  Diagnosis Date   Asthma    Barrett esophagus    Deaf    GERD (gastroesophageal reflux disease)    Schizo affective schizophrenia [facility])    Past Surgical History:  Procedure Laterality Date   CATARACT EXTRACTION     Social History:  reports that she has never smoked. She has never used smokeless tobacco. She reports that she does not drink alcohol and does not use drugs.  Allergies  Allergen Reactions   Penicillins Rash   Sulfa Antibiotics Rash    History reviewed. No pertinent family  history.  Prior to Admission medications   Medication Sig Start Date End Date Taking? Authorizing Provider  acetaminophen (TYLENOL) 500 MG tablet Take 500 mg by mouth every 6 (six) hours as needed (cramps/pain).    [provider]  atorvastatin (LIPITOR) 20 MG tablet Take 20 mg by mouth See admin instructions. Take one tablet (20 mg) by mouth daily at 3pm    [provider]  Calcium Carbonate-Vitamin D (CALCIUM-D PO) Take 1 Dose by mouth every morning. Liquid - Calcium 1500 mg/ Vitamin D3 600 units    [provider]  divalproex (DEPAKOTE) 250 MG DR tablet Take 500 mg by mouth 2 (two) times daily. 8am and 8pm 03/12/21   [provider]  fluticasone (FLONASE) 50 MCG/ACT nasal spray Place 1 spray into both nostrils daily. 12/02/21   Raspet, Noberto Retort, PA-C  guaiFENesin (MUCINEX) 600 MG 12 hr tablet Take 1 tablet (600 mg total) by mouth 2 (two) times daily. 05/28/21   Alwyn Ren, MD  ipratropium-albuterol (DUONEB) 0.5-2.5 (3) MG/3ML SOLN Take 3 mLs by nebulization every 4 (four) hours as needed (shortness of breath/wheezing). 07/13/22   Wallis Bamberg, PA-C  levocetirizine (XYZAL) 5 MG tablet Take 5 mg by mouth See admin instructions. Take one tablet (5 mg) by mouth every morning or take one tablet of loratadine 10 mg every morning (alternating monthly) 05/15/21   [provider]  levothyroxine (SYNTHROID) 150 MCG tablet Take 150 mcg by mouth See admin instructions. Take one tablet (150 mcg) by mouth daily at  3pm 04/11/21   [provider]  loratadine (CLARITIN) 10 MG tablet Take 10 mg by mouth See admin instructions. Take one tablet (10 mg) by mouth every morning or take one tablet of levocetirizine 5 mg every morning (alternating monthly) 03/16/21   [provider]  mirtazapine (REMERON) 45 MG tablet Take 45 mg by mouth at bedtime. 04/03/21   [provider]  montelukast (SINGULAIR) 10 MG tablet Take 10 mg by mouth every evening. 8pm     [provider]  naltrexone (DEPADE) 50 MG tablet Take 100 mg by mouth 2 (two) times daily. 8am and 8pm    [provider]  naltrexone (DEPADE) 50 MG tablet Take 2 tablets (100 mg total) by mouth 2 (two) times daily at 8 AM and 8 PM. 09/24/22     norethindrone-ethinyl estradiol (NORTREL 7/7/7) 0.5/0.75/1-35 MG-MCG tablet Take 1 tablet by mouth See admin instructions. Take one tablet by mouth daily at 3pm    [provider]  OLANZapine (ZYPREXA) 20 MG tablet Take 20 mg by mouth at bedtime.    [provider]  omeprazole (PRILOSEC OTC) 20 MG tablet Take 20 mg by mouth every morning.    [provider]  promethazine-dextromethorphan (PROMETHAZINE-DM) 6.25-15 MG/5ML syrup Take 5 mLs by mouth 3 (three) times daily as needed for cough. 07/30/22   Wallis Bamberg, PA-C  rOPINIRole (REQUIP) 1 MG tablet Take 1 mg by mouth 2 (two) times daily. 8am and 8pm    [provider]    Physical Exam: Vitals:   05/07/23 0253 05/07/23 0400 05/07/23 0621 05/07/23 0818  BP: (!) 140/66  134/68 (!) 109/93  Pulse: 87  85 67  Resp: 16  17 18   Temp: 99 F (37.2 C)  98.8 F (37.1 C) 97.7 F (36.5 C)  TempSrc:   Oral   SpO2: 98%  98% 95%  Weight:  50.4 kg    Height:       Neurology awake and alert ENT with mild pallor Cardiovascular with S1 and S2 present and regular Respiratory with no wheezing, scattered rhonchi with no rales Abdomen with no distention  No lower extremity edema  Data Reviewed:   Na 126, K 3,8 Cl 83 bicarbonate 29, glucose 96 bun 17 cr 0,80 Wbc 13,9 hgb 12,0 plt 265 Lactic acid 0,6 and 0,9  Sars covid 19 negative, influenza A and B negative   Chest radiograph with faint opacity the lingula on the left lung.   Assessment and Plan: Pneumonia Plan to continue antibiotic therapy with IV ceftriaxone and oral azithromycin. Continue bronchodilator therapy, and airway clearing techniques with flutter valve and incentive spirometer. Hold on  steroids   Consult speech therapy for swallow evaluation.   Acute hypoxemic respiratory failure Continue supplemental 02 per Stanton.  Keep 02 saturation 92% or greater.   Asthma No signs of acute exacerbation, Continue bronchodilator therapy.   Barrett esophagus Continue antiacid therapy with proton pump inhibitors.   Cerebral palsy (HCC) Consult PT and Ot  Deafmutism Interpreter needed. Her mother is at the bedside.   Hyponatremia Renal function stable.  Patient is tolerating po well Hold on IV fluids.  Follow up renal panel in am.   Hypothyroidism Continue levothyroxine   Schizo affective schizophrenia (HCC) Patient will continue with olanzapine, naltrexone, mirtazapine.       Advance Care Planning:   Code Status: Prior   Consults: none   Family Communication: I spoke with patient's mother at the bedside, we talked in detail about  patient's condition, plan of care and prognosis and all questions were addressed.    Severity of Illness: The appropriate patient status for this patient is INPATIENT. Inpatient status is judged to be reasonable and necessary in order to provide the required intensity of service to ensure the patient's safety. The patient's presenting symptoms, physical exam findings, and initial radiographic and laboratory data in the context of their chronic comorbidities is felt to place them at high risk for further clinical deterioration. Furthermore, it is not anticipated that the patient will be medically stable for discharge from the hospital within 2 midnights of admission.   * I certify that at the point of admission it is my clinical judgment that the patient will require inpatient hospital care spanning beyond 2 midnights from the point of admission due to high intensity of service, high risk for further deterioration and high frequency of surveillance required.*  Author: Coralie Keens, MD 05/07/2023 8:27 AM  For on call review  [email or web address].

## 2023-05-07 NOTE — Assessment & Plan Note (Addendum)
Follow up as outpatient.  

## 2023-05-07 NOTE — Progress Notes (Signed)
52 year old with history of asthma,  GERD, psych disorder presents to the ED with a  chief complaint of dyspnea. Worse on exertion. She had covid 2 weeks ago. She finished paxlovid. Today, she has lingular pneumonia on CXR. Her labs and vitals look pretty good except for a hyponatremia at 125, and that she desats on exertion. It is reported that she desats to mid 58s. She was started on rocephin and zithromax. Will start breathing treatments as needed given history of asthma. TRH to assume care when patient arrives in hospital.

## 2023-05-07 NOTE — Progress Notes (Signed)
Patient ambulated halfway around the department while on pulse ox.  Upon return to the room her SPO2 dropped to 86% and it took her 3 minutes for her SPO2 to return between 93% and 94% at rest.

## 2023-05-07 NOTE — Progress Notes (Signed)
Patient's SPO2 is 89%.  Placed patient on 2 liter nasal cannula.  Patients SPO2 is now 96%.  RT will continue to monitor.

## 2023-05-08 ENCOUNTER — Other Ambulatory Visit (HOSPITAL_COMMUNITY): Payer: Self-pay

## 2023-05-08 ENCOUNTER — Inpatient Hospital Stay (HOSPITAL_COMMUNITY): Payer: 59

## 2023-05-08 DIAGNOSIS — K22719 Barrett's esophagus with dysplasia, unspecified: Secondary | ICD-10-CM | POA: Diagnosis not present

## 2023-05-08 DIAGNOSIS — J189 Pneumonia, unspecified organism: Secondary | ICD-10-CM | POA: Diagnosis not present

## 2023-05-08 DIAGNOSIS — J45909 Unspecified asthma, uncomplicated: Secondary | ICD-10-CM | POA: Diagnosis not present

## 2023-05-08 DIAGNOSIS — G804 Ataxic cerebral palsy: Secondary | ICD-10-CM | POA: Diagnosis not present

## 2023-05-08 LAB — CBC
HCT: 35.9 % — ABNORMAL LOW (ref 36.0–46.0)
Hemoglobin: 11.7 g/dL — ABNORMAL LOW (ref 12.0–15.0)
MCH: 26.1 pg (ref 26.0–34.0)
MCHC: 32.6 g/dL (ref 30.0–36.0)
MCV: 80.1 fL (ref 80.0–100.0)
Platelets: 248 10*3/uL (ref 150–400)
RBC: 4.48 MIL/uL (ref 3.87–5.11)
RDW: 14.6 % (ref 11.5–15.5)
WBC: 7.3 10*3/uL (ref 4.0–10.5)
nRBC: 0 % (ref 0.0–0.2)

## 2023-05-08 LAB — BASIC METABOLIC PANEL
Anion gap: 10 (ref 5–15)
BUN: 9 mg/dL (ref 6–20)
CO2: 34 mmol/L — ABNORMAL HIGH (ref 22–32)
Calcium: 9.9 mg/dL (ref 8.9–10.3)
Chloride: 93 mmol/L — ABNORMAL LOW (ref 98–111)
Creatinine, Ser: 0.79 mg/dL (ref 0.44–1.00)
GFR, Estimated: >60 mL/min (ref >60)
Glucose, Bld: 108 mg/dL — ABNORMAL HIGH (ref 70–99)
Potassium: 4.9 mmol/L (ref 3.5–5.1)
Sodium: 137 mmol/L (ref 135–145)

## 2023-05-08 MED ORDER — CEFDINIR 300 MG PO CAPS
300.0000 mg | ORAL_CAPSULE | Freq: Two times a day (BID) | ORAL | Status: DC
  Administered 2023-05-08: 300 mg via ORAL
  Filled 2023-05-08 (×2): qty 1

## 2023-05-08 MED ORDER — AZITHROMYCIN 500 MG PO TABS
500.0000 mg | ORAL_TABLET | Freq: Every day | ORAL | 0 refills | Status: AC
  Filled 2023-05-08: qty 4, 4d supply, fill #0

## 2023-05-08 MED ORDER — IPRATROPIUM-ALBUTEROL 0.5-2.5 (3) MG/3ML IN SOLN: 3.0000 mL | Freq: Three times a day (TID) | RESPIRATORY_TRACT | Status: DC

## 2023-05-08 MED ORDER — AZITHROMYCIN 500 MG PO TABS: 500.0000 mg | ORAL_TABLET | Freq: Every day | ORAL | Status: DC

## 2023-05-08 MED ORDER — CEFDINIR 300 MG PO CAPS
300.0000 mg | ORAL_CAPSULE | Freq: Two times a day (BID) | ORAL | 0 refills | Status: AC
  Filled 2023-05-08: qty 8, 4d supply, fill #0

## 2023-05-08 NOTE — Discharge Summary (Addendum)
Physician Discharge Summary   Patient: Angela Nicholson MRN: [id number] DOB: July 03, 1971  Admit date:     05/06/2023  Discharge date: 05/08/23  Discharge Physician: Angela Nicholson   PCP: Angela Presto, MD   Recommendations at discharge:    Patient will continue antibiotic therapy with cefdinir and azithromycin to complete 5 days. Continue bronchodilator therapy and airway clearing techniques with flutter valve and incentive spirometer.  Follow up with Angela Nicholson in 7 to 10 days.   Discharge Diagnoses: Active Problems:   Pneumonia   Asthma   Barrett esophagus   Cerebral palsy (HCC)   Deafmutism   Hyponatremia   Hypothyroidism   Schizo affective schizophrenia (HCC)  Resolved Problems:   * No resolved hospital problems. Angela Nicholson: Angela Nicholson was admitted to the hospital with the working diagnosis of community acquired pneumonia complicated with hypoxemic respiratory failure.   52 y.o. female with medical history significant of asthma, barrett esophagus, schizoaffective schizophrenia who presented with dyspnea and fever.  Patient was diagnosed with COVID 19 infection about 3 weeks prior to admission and was treated with antiviral therapy with improvement in her symptoms.  The night prior to admission she had acute dyspnea, and fever, up to 100,2 F. Her mother took her to primary care provider, she was found hypoxic down to 89% on room air and was referred to the ED for further evaluation. On her initial physical examination her blood pressure was 140/66, HR 87, RR 16 and 02 saturation 95% on supplemental 02 per [place], lungs with no wheezing, scattered rhonchi with no rales, heart with S1 and S2 present and regular, abdomen with no distention and no lower extremity edema.   Na 126, K 3,8 Cl 83 bicarbonate 29, glucose 96 bun 17 cr 0,80 Wbc 13,9 hgb 12,0 plt 265 Lactic acid 0,6 and 0,9  Sars covid 19 negative, influenza A and B negative    Chest radiograph  with faint opacity the lingula on the left lung.   Patient was placed on antibiotic therapy, supplemental 02 per [place] and bronchodilator therapy with improvement in her symptoms.   10/18 clinically improved, plan for discharge home, post swallow evaluation and ambulatory oxymetry on room air.  Ambulatory oxymetry on room air 92%.   Assessment and Plan: Pneumonia Left lingular community acquired pneumonia.   Patient was placed on antibiotic therapy with IV ceftriaxone and oral azithromycin. Bronchodilator therapy, and airway clearing techniques with flutter valve and incentive spirometer.  Transition to oral cefdinir and azithromycin to complete 5 days.  Follow up with speech therapy for swallow evaluation.   Acute hypoxemic respiratory failure.  Her dyspnea and oxygenation have improved.  At the time of her discharge she will have a ambulatory oxymetry on room air.   Asthma No signs of acute exacerbation, Continue bronchodilator therapy.   Barrett esophagus Continue antiacid therapy with proton pump inhibitors.   Cerebral palsy (HCC) Follow up as outpatient.   Deafmutism Interpreter needed. Her mother is at the bedside.   Hyponatremia Renal function continue to be stable at 0,79, with K at 4,9 and serum bicarbonate at 34. Na 137.   Hypothyroidism Continue levothyroxine   Schizo affective schizophrenia [employer]) Patient will continue with olanzapine, naltrexone, mirtazapine.          Consultants: none  Procedures performed: none   Disposition: Home Diet recommendation:  Discharge Diet Orders (From admission, onward)     Start     Ordered   05/08/23 0000  Diet -  low sodium heart healthy        05/08/23 1232           Regular diet Swallow Evaluation Recommendations Recommendations: PO diet PO Diet Recommendation: Dysphagia 3 (Mechanical soft);Thin liquids (Level 0) Liquid Administration via: Cup;Straw Medication Administration: Whole meds with  puree Supervision: Patient able to self-feed;Full supervision/cueing for swallowing strategies Swallowing strategies  : Minimize environmental distractions;Slow rate;Small bites/sips Postural changes: Position pt fully upright for meals Oral care recommendations: Oral care BID (2x/day)   DISCHARGE MEDICATION: Allergies as of 05/08/2023       Reactions   Penicillins Rash   Sulfa Antibiotics Rash        Medication List     TAKE these medications    acetaminophen 500 MG tablet Commonly known as: TYLENOL Take 500 mg by mouth every 6 (six) hours as needed (cramps/pain).   albuterol (2.5 MG/3ML) 0.083% nebulizer solution Commonly known as: PROVENTIL Take 2.5 mg by nebulization every 6 (six) hours as needed for shortness of breath.   atorvastatin 20 MG tablet Commonly known as: LIPITOR Take 20 mg by mouth See admin instructions. Take one tablet (20 mg) by mouth daily at 3pm   azithromycin 500 MG tablet Commonly known as: ZITHROMAX Take 1 tablet (500 mg total) by mouth daily for 4 days. Start taking on: May 09, 2023   CALCIUM-D PO Take 1 Dose by mouth every morning. Liquid - Calcium 1500 mg/ Vitamin D3 600 units   cefdinir 300 MG capsule Commonly known as: OMNICEF Take 1 capsule (300 mg total) by mouth every 12 (twelve) hours for 4 days.   divalproex 250 MG Angela tablet Commonly known as: DEPAKOTE Take 500 mg by mouth 2 (two) times daily. 8am and 8pm   esomeprazole 40 MG capsule Commonly known as: NEXIUM Take 1 capsule by mouth 2 (two) times daily.   fluticasone 50 MCG/ACT nasal spray Commonly known as: FLONASE Place 1 spray into both nostrils daily.   guaiFENesin 600 MG 12 hr tablet Commonly known as: MUCINEX Take 1 tablet (600 mg total) by mouth 2 (two) times daily.   hydrOXYzine 25 MG capsule Commonly known as: VISTARIL Take 25 mg by mouth daily as needed.   ipratropium-albuterol 0.5-2.5 (3) MG/3ML Soln Commonly known as: DUONEB Take 3 mLs by  nebulization every 4 (four) hours as needed (shortness of breath/wheezing).   levocetirizine 5 MG tablet Commonly known as: XYZAL Take 5 mg by mouth See admin instructions. Take one tablet (5 mg) by mouth every morning or take one tablet of loratadine 10 mg every morning (alternating monthly)   levothyroxine 125 MCG tablet Commonly known as: SYNTHROID Take 125 mcg by mouth daily before breakfast.   mirtazapine 45 MG tablet Commonly known as: REMERON Take 45 mg by mouth at bedtime.   naltrexone 50 MG tablet Commonly known as: DEPADE Take 100 mg by mouth 2 (two) times daily. 8am and 8pm   OLANZapine 20 MG tablet Commonly known as: ZYPREXA Take 20 mg by mouth at bedtime.   Olopatadine HCl 0.6 % Soln Place 2 sprays into both nostrils daily.   rOPINIRole 1 MG tablet Commonly known as: REQUIP Take 1 mg by mouth 2 (two) times daily. 8am and 8pm        Discharge Exam: Filed Weights   05/06/23 1751 05/07/23 0400  Weight: 50.4 kg 50.4 kg   BP 118/63 (BP Location: Left Arm)   Pulse 72   Temp 97.8 F (36.6 C) (Oral)   Resp 18  Ht 4\' 11"  (1.499 m)   Wt 50.4 kg   LMP 06/17/2013   SpO2 98%   BMI 22.44 kg/m   Patient is feeling well, no chest pain or dyspnea, no cough or wheezing  Neurology awake and alert ENT with no pallor Cardiovascular with S1 and S2 present and regular with no gallops, rubs or murmurs Respiratory with mild rales at the left base with no wheezing or rhonchi Abdomen with no distention  No lower extremity edema   Condition at discharge: stable  The results of significant diagnostics from this hospitalization (including imaging, microbiology, ancillary and laboratory) are listed below for reference.   Imaging Studies: DG Chest 2 View  Result Date: 05/06/2023 CLINICAL DATA:  Hypoxia, fever EXAM: CHEST - 2 VIEW COMPARISON:  07/30/2022 FINDINGS: Patchy opacity in the lingula concerning for pneumonia. Right lung clear. No effusions. Heart and  mediastinal contours within normal limits. No acute bony abnormality. IMPRESSION: Patchy lingular opacity concerning for pneumonia. Electronically Signed   By: Charlett Nose M.D.   On: 05/06/2023 21:38    Microbiology: Results for orders placed or performed during the hospital encounter of 05/06/23  Resp panel by RT-PCR (RSV, Flu A&B, Covid) Anterior Nasal Swab     Status: None   Collection Time: 05/06/23  9:58 PM   Specimen: Anterior Nasal Swab  Result Value Ref Range Status   SARS Coronavirus 2 by RT PCR NEGATIVE NEGATIVE Final    Comment: (NOTE) SARS-CoV-2 target nucleic acids are NOT DETECTED.  The SARS-CoV-2 RNA is generally detectable in upper respiratory specimens during the acute phase of infection. The lowest concentration of SARS-CoV-2 viral copies this assay can detect is 138 copies/mL. A negative result does not preclude SARS-Cov-2 infection and should not be used as the sole basis for treatment or other patient management decisions. A negative result may occur with  improper specimen collection/handling, submission of specimen other than nasopharyngeal swab, presence of viral mutation(s) within the areas targeted by this assay, and inadequate number of viral copies(<138 copies/mL). A negative result must be combined with clinical observations, patient history, and epidemiological information. The expected result is Negative.  Fact Sheet for Patients:  BloggerCourse.com  Fact Sheet for Healthcare Providers:  SeriousBroker.it  This test is no t yet approved or cleared by the [place] FDA and  has been authorized for detection and/or diagnosis of SARS-CoV-2 by FDA under an Emergency Use Authorization (EUA). This EUA will remain  in effect (meaning this test can be used) for the duration of the COVID-19 declaration under Section 564(b)(1) of the Act, 21 U.S.C.section 360bbb-3(b)(1), unless the authorization is  terminated  or revoked sooner.       Influenza A by PCR NEGATIVE NEGATIVE Final   Influenza B by PCR NEGATIVE NEGATIVE Final    Comment: (NOTE) The Xpert Xpress SARS-CoV-2/FLU/RSV plus assay is intended as an aid in the diagnosis of influenza from Nasopharyngeal swab specimens and should not be used as a sole basis for treatment. Nasal washings and aspirates are unacceptable for Xpert Xpress SARS-CoV-2/FLU/RSV testing.  Fact Sheet for Patients: BloggerCourse.com  Fact Sheet for Healthcare Providers: SeriousBroker.it  This test is not yet approved or cleared by the [place] FDA and has been authorized for detection and/or diagnosis of SARS-CoV-2 by FDA under an Emergency Use Authorization (EUA). This EUA will remain in effect (meaning this test can be used) for the duration of the COVID-19 declaration under Section 564(b)(1) of the Act, 21 U.S.C. section 360bbb-3(b)(1), unless the  authorization is terminated or revoked.     Resp Syncytial Virus by PCR NEGATIVE NEGATIVE Final    Comment: (NOTE) Fact Sheet for Patients: BloggerCourse.com  Fact Sheet for Healthcare Providers: SeriousBroker.it  This test is not yet approved or cleared by the [place] FDA and has been authorized for detection and/or diagnosis of SARS-CoV-2 by FDA under an Emergency Use Authorization (EUA). This EUA will remain in effect (meaning this test can be used) for the duration of the COVID-19 declaration under Section 564(b)(1) of the Act, 21 U.S.C. section 360bbb-3(b)(1), unless the authorization is terminated or revoked.  Performed at [facility], [address] [place], [place] [address]     Labs: CBC: Recent Labs  Lab 05/06/23 2242 05/08/23 1024  WBC 13.9* 7.3  NEUTROABS 10.4*  --   HGB 12.0 11.7*  HCT 36.3 35.9*  MCV 80.7 80.1  PLT 265 248   Basic Metabolic  Panel: Recent Labs  Lab 05/06/23 2242 05/08/23 1024  NA 125* 137  K 3.8 4.9  CL 83* 93*  CO2 29 34*  GLUCOSE 96 108*  BUN 17 9  CREATININE 0.80 0.79  CALCIUM 9.5 9.9   Liver Function Tests: Recent Labs  Lab 05/06/23 2242  AST 29  ALT 29  ALKPHOS 47  BILITOT 0.7  PROT 6.9  ALBUMIN 3.6   CBG: No results for input(s): "GLUCAP" in the last 168 hours.  Discharge time spent: greater than 30 minutes.  Signed: Coralie Keens, MD [facility] 05/08/2023

## 2023-05-08 NOTE — Hospital Course (Addendum)
Mrs. Flicker was admitted to the hospital with the working diagnosis of community acquired pneumonia complicated with hypoxemic respiratory failure.   52 y.o. female with medical history significant of asthma, barrett esophagus, schizoaffective schizophrenia who presented with dyspnea and fever.  Patient was diagnosed with COVID 19 infection about 3 weeks prior to admission and was treated with antiviral therapy with improvement in her symptoms.  The night prior to admission she had acute dyspnea, and fever, up to 100,2 F. Her mother took her to primary care provider, she was found hypoxic down to 89% on room air and was referred to the ED for further evaluation. On her initial physical examination her blood pressure was 140/66, HR 87, RR 16 and 02 saturation 95% on supplemental 02 per [place], lungs with no wheezing, scattered rhonchi with no rales, heart with S1 and S2 present and regular, abdomen with no distention and no lower extremity edema.   Na 126, K 3,8 Cl 83 bicarbonate 29, glucose 96 bun 17 cr 0,80 Wbc 13,9 hgb 12,0 plt 265 Lactic acid 0,6 and 0,9  Sars covid 19 negative, influenza A and B negative    Chest radiograph with faint opacity the lingula on the left lung.   Patient was placed on antibiotic therapy, supplemental 02 per [place] and bronchodilator therapy with improvement in her symptoms.   10/18 clinically improved, plan for discharge home, post swallow evaluation and ambulatory oxymetry on room air.  Ambulatory oxymetry on room air 92%.

## 2023-05-08 NOTE — Evaluation (Signed)
Clinical/Bedside Swallow Evaluation Patient Details  Name: Angela Nicholson MRN: [id number] Date of Birth: August 11, 1970  Today's Date: 05/08/2023 Time: SLP Start Time (ACUTE ONLY): 1302 SLP Stop Time (ACUTE ONLY): 1320 SLP Time Calculation (min) (ACUTE ONLY): 18 min  Past Medical History:  Past Medical History:  Diagnosis Date   Asthma    Barrett esophagus    Deaf    GERD (gastroesophageal reflux disease)    Schizo affective schizophrenia (HCC)    Past Surgical History:  Past Surgical History:  Procedure Laterality Date   CATARACT EXTRACTION     HPI:  Angela Nicholson is a 52 yo female presenting to ED 10/17 with dyspnea and fever. CXR with patchy opacities concerning for PNA after COVID 3 weeks ago. Pt has a history of recurrent PNA related to aspiration, requiring PEG tube placement although she currently eats per mouth. Seen extensively by OP SLP 2019-2022 for dysphagia goals, although most recent MBS 05/27/2021 revealed improved oropharyngeal function and pt was recommended to continue Dys 3 diet with thin liquids and full supervision. PMH includes asthma, Barrett's esophagus, schizoaffective disorder, IDD, deafness    Assessment / Plan / Recommendation  Clinical Impression  ASL teleinterpreter, Angela Nicholson (#[id number]) was utilized throughout the session to facilitate communication in addition to assistance from pt's mother. They report that pt has a history of difficulty swallowing and she frequently experiences bouts of coughing while eating and drinking, although also when not consuming POs. She does endorse a history of reflux, for which she takes meds, although states she sometimes forgets or they are not totally effective. Oral motor exam WFL. Observed pt with thin liquids and solids with no overt s/s of dysphagia. Pt frequently adding emphasis to the importance of taking single, controlled bites/sips, for which her mother states they have various visuals around their home to provide  reminders to pt. After swallowing the solid, pt had an immediate cough which she states is not rare. Pt and her mother state that presentation today with absence of coughing appears to be an exception to pt's norm and they express interest in repeating MBS. Recommend continuing current diet and SLP will f/u to complete MBS this date.  SLP Visit Diagnosis: Dysphagia, unspecified (R13.10)    Aspiration Risk  Moderate aspiration risk    Diet Recommendation Dysphagia 3 (Mech soft);Thin liquid    Liquid Administration via: Cup;Straw Medication Administration: Whole meds with puree Supervision: Patient able to self feed;Full supervision/cueing for compensatory strategies Compensations: Slow rate;Minimize environmental distractions;Small sips/bites;Multiple dry swallows after each bite/sip Postural Changes: Seated upright at 90 degrees    Other  Recommendations Oral Care Recommendations: Oral care BID;Staff/trained caregiver to provide oral care    Recommendations for follow up therapy are one component of a multi-disciplinary discharge planning process, led by the attending physician.  Recommendations may be updated based on patient status, additional functional criteria and insurance authorization.  Follow up Recommendations Outpatient SLP      Assistance Recommended at Discharge    Functional Status Assessment Patient has had a recent decline in their functional status and demonstrates the ability to make significant improvements in function in a reasonable and predictable amount of time.  Frequency and Duration min 2x/week  2 weeks       Prognosis Prognosis for improved oropharyngeal function: Good Barriers to Reach Goals: Cognitive deficits;Time post onset      Swallow Study   General HPI: Angela Nicholson is a 52 yo female presenting to ED 10/17 with dyspnea and fever. CXR  with patchy opacities concerning for PNA after COVID 3 weeks ago. Pt has a history of recurrent PNA related  to aspiration, requiring PEG tube placement although she currently eats per mouth. Seen extensively by OP SLP 2019-2022 for dysphagia goals, although most recent MBS 05/27/2021 revealed improved oropharyngeal function and pt was recommended to continue Dys 3 diet with thin liquids and full supervision. PMH includes asthma, Barrett's esophagus, schizoaffective disorder, IDD, deafness Type of Study: Bedside Swallow Evaluation Previous Swallow Assessment: see HPI Diet Prior to this Study: Regular;Thin liquids (Level 0) Temperature Spikes Noted: No Respiratory Status: Nasal cannula History of Recent Intubation: No Behavior/Cognition: Alert;Cooperative;Pleasant mood;Requires cueing Oral Cavity Assessment: Within Functional Limits Oral Care Completed by SLP: No Oral Cavity - Dentition: Adequate natural dentition Vision: Functional for self-feeding Self-Feeding Abilities: Able to feed self Patient Positioning: Upright in bed Baseline Vocal Quality: Normal Volitional Cough: Strong Volitional Swallow: Able to elicit    Oral/Motor/Sensory Function Overall Oral Motor/Sensory Function: Within functional limits   Ice Chips Ice chips: Not tested   Thin Liquid Thin Liquid: Within functional limits Presentation: Straw;Self Fed    Nectar Thick Nectar Thick Liquid: Not tested   Honey Thick Honey Thick Liquid: Not tested   Puree Puree: Not tested   Solid     Solid: Impaired Presentation: Self Fed Pharyngeal Phase Impairments: Cough - Immediate      Angela Nicholson, M.A., CF-SLP Speech Language Pathology, Acute Rehabilitation Services  Secure Chat preferred [phone]  05/08/2023,2:16 PM

## 2023-05-08 NOTE — Progress Notes (Signed)
Transition of Care [facility]) - Inpatient Brief Assessment   Patient Details  Name: Angela Nicholson MRN: [id number] Date of Birth: 07-01-1971  [facility]) CM/SW Contact:    Janae Bridgeman, RN Phone Number: 05/08/2023, 11:08 AM   Clinical Narrative: Patient admitted to the hospital for pneumonia and is currently being treated with antibiotics and oxygen at 2L/min.  Patient is deaf with IDD and will return home with the mother when stable for discharge.  Patient 's baseline in RA without oxygen.  No TOC needs at this time.  Mother is present at the bedside and provides 24 hour assistance at the home along with [facility] Deaf training at the home 2 days per week from 9 am to 5 pm.  No TOC needs.   Transition of Care Asessment: Insurance and Status: (P) Insurance coverage has been reviewed Patient has primary care physician: (P) Yes Home environment has been reviewed: (P) from home with mother Prior level of function:: (P) [facility] provides care in the home on Tuesday, thursday to assist with independent skills with the patient due to IDD/Deaf Prior/Current Home Services: (P) Current home services Social Determinants of Health Reivew: (P) SDOH reviewed interventions complete Readmission risk has been reviewed: (P) Yes Transition of care needs: (P) no transition of care needs at this time

## 2023-05-08 NOTE — Plan of Care (Signed)

## 2023-05-08 NOTE — Plan of Care (Signed)
[facility] Tonsillectomy/Adenoidectomy, Postoperative PEDS care plan entered in error.

## 2023-05-08 NOTE — Progress Notes (Signed)
Modified Barium Swallow Study  Patient Details  Name: Angela Nicholson MRN: [id number] Date of Birth: May 17, 1971  Today's Date: 05/08/2023  Modified Barium Swallow completed.  Full report located under Chart Review in the Imaging Section.  History of Present Illness Angela Nicholson is a 52 yo female presenting to ED 10/17 with dyspnea and fever. CXR with patchy opacities concerning for PNA after COVID 3 weeks ago. Pt has a history of recurrent PNA related to aspiration, requiring PEG tube placement although she currently eats per mouth. Seen extensively by OP SLP 2019-2022 for dysphagia goals, although most recent MBS 05/27/2021 revealed improved oropharyngeal function and pt was recommended to continue Dys 3 diet with thin liquids and full supervision. PMH includes asthma, Barrett's esophagus, schizoaffective disorder, IDD, deafness   Clinical Impression ASL teleinterpreter, Waldon Merl [phone]) was utilized to facilitate communication with pt throughout MBS. She presents with a similar presentation compared to prior [facility] 2022. Pt is quite impulsive with PO intake which causes significant variance in her performance throughout trials. With thin liquids, pt consistently has trace and transient penetration with apparent sensation and strong cough response (PAS 2), which is considered WFL. During one trial of nectar thick liquids, pt had significant oral residue that progressed posteriorly to the level of the valleculae following the initial swallow and eventually spilled over the epiglottis and into the open laryngeal vestibule, resting on pt's vocal folds without sensation (PAS 5). Pt had functional oral transit and no penetration/aspiration with trials of purees and solids. SLP administered the barium pill with thin liquids with pt not initially achieving oral clearance and requiring subsequent teaspoons of purees to clear. While swallowing the pill with thin liquids, suspect the challenge of mixed  consistencies caused pt to silently aspirate the thin liquids (PAS 8). This was not replicated throughout any further trials of challenging consecutive sips of thin liquids. The esophageal sweep revealed prompt and complete clearance. Overall, suspect pt's presentation may continue to vary. Do not suspect aspiration on MBS is an isolated incident and recognize the probability that pt experiences aspiration when using a fast rate due to impulsivity, which would likely occur despite attempts at diet modification. Provided education to pt and her mother regarding safest swallowing strategies, f/u with an OP SLP, and aspiration precautions. Recommend continuing diet of Dys 3 texture solids with single, controlled sips of thin liquids. Meds should be given whole in purees. Recommend f/u with SLP on an OP basis to maintain current swallow function and prevent decline. Factors that may increase risk of adverse event in presence of aspiration Angela Nicholson & Clearance Angela Nicholson 2021): Poor general health and/or compromised immunity;Reduced cognitive function  Swallow Evaluation Recommendations Recommendations: PO diet PO Diet Recommendation: Dysphagia 3 (Mechanical soft);Thin liquids (Level 0) Liquid Administration via: Cup;Straw Medication Administration: Whole meds with puree Supervision: Patient able to self-feed;Full supervision/cueing for swallowing strategies Swallowing strategies  : Minimize environmental distractions;Slow rate;Small bites/sips Postural changes: Position pt fully upright for meals Oral care recommendations: Oral care BID (2x/day)      Gwynneth Aliment, M.A., CF-SLP Speech Language Pathology, Acute Rehabilitation Services  Secure Chat preferred [phone]  05/08/2023,2:39 PM

## 2023-08-06 ENCOUNTER — Other Ambulatory Visit (HOSPITAL_BASED_OUTPATIENT_CLINIC_OR_DEPARTMENT_OTHER): Payer: Self-pay

## 2023-08-06 ENCOUNTER — Emergency Department (HOSPITAL_BASED_OUTPATIENT_CLINIC_OR_DEPARTMENT_OTHER)
Admission: EM | Admit: 2023-08-06 | Discharge: 2023-08-06 | Disposition: A | Discharge status: Home / Self Care | Payer: 59

## 2023-08-06 ENCOUNTER — Other Ambulatory Visit: Payer: Self-pay

## 2023-08-06 ENCOUNTER — Encounter (HOSPITAL_BASED_OUTPATIENT_CLINIC_OR_DEPARTMENT_OTHER): Payer: Self-pay

## 2023-08-06 DIAGNOSIS — R0981 Nasal congestion: Secondary | ICD-10-CM | POA: Insufficient documentation

## 2023-08-06 DIAGNOSIS — J45909 Unspecified asthma, uncomplicated: Secondary | ICD-10-CM | POA: Diagnosis not present

## 2023-08-06 DIAGNOSIS — H6690 Otitis media, unspecified, unspecified ear: Secondary | ICD-10-CM

## 2023-08-06 DIAGNOSIS — H6693 Otitis media, unspecified, bilateral: Secondary | ICD-10-CM | POA: Diagnosis not present

## 2023-08-06 DIAGNOSIS — Z7951 Long term (current) use of inhaled steroids: Secondary | ICD-10-CM | POA: Insufficient documentation

## 2023-08-06 DIAGNOSIS — H9203 Otalgia, bilateral: Secondary | ICD-10-CM | POA: Diagnosis present

## 2023-08-06 MED ORDER — CEFDINIR 300 MG PO CAPS
300.0000 mg | ORAL_CAPSULE | Freq: Two times a day (BID) | ORAL | 0 refills | Status: DC
Start: 2023-08-06 — End: 2023-08-06

## 2023-08-06 MED ORDER — CEFDINIR 300 MG PO CAPS
300.0000 mg | ORAL_CAPSULE | Freq: Two times a day (BID) | ORAL | 0 refills | Status: AC
Start: 2023-08-06 — End: 2023-08-13
  Filled 2023-08-06: qty 14, 7d supply, fill #0

## 2023-08-06 NOTE — ED Triage Notes (Addendum)
Pt brought in by mother and care worker. Complaints of intermittent bilateral ear pain for 3 weeks Now bilateral ear pain since yesterday Mild congestion. Also, hands have been shaky and takes meds for this

## 2023-08-06 NOTE — Discharge Instructions (Addendum)
Take prescribed antibiotics twice daily for 7 days.  Please complete entire course of antibiotics even if pain resolves.  You can also use Tylenol to help with the ear pain.  Follow-up with your primary care doctor if you are still experiencing ear pain after taking antibiotics for 3 to 4 days.  You can also use saline nasal drops or sprays to help reduce nasal congestion and pressure in the ear canal.

## 2023-08-06 NOTE — ED Provider Notes (Signed)
[place] [facility] Provider Note   CSN: [id number] Arrival date & time: 08/06/23  1191     History  Chief Complaint  Patient presents with   Otalgia    Angela Nicholson is a 53 y.o. female.  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  Ears have been bothering her intermittently over the past 1 to 2 weeks but have become more persistent in the past 24 hours.  Patient also reports that she has had some nasal congestion and runny nose.  No fevers.  No cough or sore throat.  No known sick contacts.  No drainage from the ears.  Patient's mother is at bedside and aids in providing history.  ASL interpreter used.  Also noted that patient takes medication for shaking in her hands that she has noticed an increase shaking over the past 2 weeks.  The history is provided by the patient.  Otalgia Associated symptoms: rhinorrhea   Associated symptoms: no ear discharge and no fever        Home Medications Prior to Admission medications   Medication Sig Start Date End Date Taking? Authorizing Provider  acetaminophen (TYLENOL) 500 MG tablet Take 500 mg by mouth every 6 (six) hours as needed (cramps/pain).    [provider]  albuterol (PROVENTIL) (2.5 MG/3ML) 0.083% nebulizer solution Take 2.5 mg by nebulization every 6 (six) hours as needed for shortness of breath.    [provider]  atorvastatin (LIPITOR) 20 MG tablet Take 20 mg by mouth See admin instructions. Take one tablet (20 mg) by mouth daily at 3pm    [provider]  Calcium Carbonate-Vitamin D (CALCIUM-D PO) Take 1 Dose by mouth every morning. Liquid - Calcium 1500 mg/ Vitamin D3 600 units    [provider]  divalproex (DEPAKOTE) 250 MG DR tablet Take 500 mg by mouth 2 (two) times daily. 8am and 8pm 03/12/21   [provider]   esomeprazole (NEXIUM) 40 MG capsule Take 1 capsule by mouth 2 (two) times daily. 05/21/22   [provider]  fluticasone (FLONASE) 50 MCG/ACT nasal spray Place 1 spray into both nostrils daily. 12/02/21   Raspet, Noberto Retort, PA-C  guaiFENesin (MUCINEX) 600 MG 12 hr tablet Take 1 tablet (600 mg total) by mouth 2 (two) times daily. 05/28/21   Alwyn Ren, MD  hydrOXYzine (VISTARIL) 25 MG capsule Take 25 mg by mouth daily as needed.    [provider]  ipratropium-albuterol (DUONEB) 0.5-2.5 (3) MG/3ML SOLN Take 3 mLs by nebulization every 4 (four) hours as needed (shortness of breath/wheezing). 07/13/22   Wallis Bamberg, PA-C  levocetirizine (XYZAL) 5 MG tablet Take 5 mg by mouth See admin instructions. Take one tablet (5 mg) by mouth every morning or take one tablet of loratadine 10 mg every morning (alternating monthly) 05/15/21   [provider]  levothyroxine (SYNTHROID) 125 MCG tablet Take 125 mcg by mouth daily before breakfast.    [provider]  mirtazapine (REMERON) 45 MG tablet Take 45 mg by mouth at bedtime. 04/03/21   [provider]  naltrexone (DEPADE) 50 MG tablet Take 100 mg by mouth 2 (two) times daily. 8am and 8pm    [provider]  OLANZapine (ZYPREXA) 20 MG tablet Take 20 mg by mouth at bedtime.    [provider]  Olopatadine HCl 0.6 % SOLN  Place 2 sprays into both nostrils daily. 09/09/22   [provider]  rOPINIRole (REQUIP) 1 MG tablet Take 1 mg by mouth 2 (two) times daily. 8am and 8pm    [provider]      Allergies    Penicillins and Sulfa antibiotics    Review of Systems   Review of Systems  Constitutional:  Negative for chills and fever.  HENT:  Positive for ear pain and rhinorrhea. Negative for ear discharge.     Physical Exam Updated Vital Signs BP (!) 106/58   Pulse 72   Temp 97.8 F (36.6 C) (Oral)   Resp 18   Wt 53.4 kg   LMP 06/17/2013   SpO2 92%   BMI 23.79 kg/m   Physical Exam Vitals and nursing note reviewed.  Constitutional:      General: She is not in acute distress.    Appearance: Normal appearance. She is well-developed. She is not ill-appearing or diaphoretic.  HENT:     Head: Normocephalic and atraumatic.     Right Ear: Ear canal and external ear normal. Tympanic membrane is erythematous and bulging.     Left Ear: Ear canal and external ear normal. Tympanic membrane is erythematous and bulging.     Nose: Congestion and rhinorrhea present.     Mouth/Throat:     Mouth: Mucous membranes are dry.  Eyes:     General:        Right eye: No discharge.        Left eye: No discharge.  Pulmonary:     Effort: Pulmonary effort is normal. No respiratory distress.  Neurological:     Mental Status: She is alert and oriented to person, place, and time.     Coordination: Coordination normal.  Psychiatric:        Mood and Affect: Mood normal.        Behavior: Behavior normal.     ED Results / Procedures / Treatments   Labs (all labs ordered are listed, but only abnormal results are displayed) Labs Reviewed - No data to display  EKG None  Radiology No results found.  Procedures Procedures    Medications Ordered in ED Medications - No data to display  ED Course/ Medical Decision Making/ A&P                                 Medical Decision Making Risk Prescription drug management.   Patient presents with ear pain, exam is consistent with bilateral otitis media, patient is well-appearing with normal vitals, afebrile.  Has penicillin allergy but has tolerated cephalosporins in the past.  Will treat with Omnicef.  Also discussed using saline and nasal decongestants to help reduce nasal congestion that may be contributing to symptoms.  Encouraged use of Tylenol for pain.  Encouraged them to follow-up with primary care doctor regarding increased shaking of the hands that family has noted since primary care provider has been managing this  medication.  Patient and family member expressed understanding and agreement.  All plans discussed using ASL interpreter.  Discharged home in good condition.        Final Clinical Impression(s) / ED Diagnoses Final diagnoses:  Acute otitis media, unspecified otitis media type    Rx / DC Orders ED Discharge Orders          Ordered    cefdinir (OMNICEF) 300 MG capsule  2 times daily,   Status:  Discontinued        08/06/23 1119    cefdinir (OMNICEF) 300 MG capsule  2 times daily        08/06/23 1119              Rosezella Rumpf, [place] 08/06/23 1807    Durwin Glaze, MD 08/07/23 [phone]

## 2023-10-06 ENCOUNTER — Other Ambulatory Visit: Payer: Self-pay

## 2023-10-06 ENCOUNTER — Emergency Department (HOSPITAL_BASED_OUTPATIENT_CLINIC_OR_DEPARTMENT_OTHER)
Admission: EM | Admit: 2023-10-06 | Discharge: 2023-10-06 | Disposition: A | Discharge status: Home / Self Care | Attending: Emergency Medicine | Admitting: Emergency Medicine

## 2023-10-06 ENCOUNTER — Encounter (HOSPITAL_BASED_OUTPATIENT_CLINIC_OR_DEPARTMENT_OTHER): Payer: Self-pay

## 2023-10-06 ENCOUNTER — Emergency Department (HOSPITAL_BASED_OUTPATIENT_CLINIC_OR_DEPARTMENT_OTHER)

## 2023-10-06 DIAGNOSIS — S0083XA Contusion of other part of head, initial encounter: Secondary | ICD-10-CM | POA: Diagnosis not present

## 2023-10-06 DIAGNOSIS — J3489 Other specified disorders of nose and nasal sinuses: Secondary | ICD-10-CM | POA: Diagnosis present

## 2023-10-06 DIAGNOSIS — S0012XA Contusion of left eyelid and periocular area, initial encounter: Secondary | ICD-10-CM | POA: Diagnosis not present

## 2023-10-06 DIAGNOSIS — S7012XA Contusion of left thigh, initial encounter: Secondary | ICD-10-CM | POA: Diagnosis not present

## 2023-10-06 DIAGNOSIS — Y9301 Activity, walking, marching and hiking: Secondary | ICD-10-CM | POA: Diagnosis not present

## 2023-10-06 DIAGNOSIS — S0031XA Abrasion of nose, initial encounter: Secondary | ICD-10-CM | POA: Insufficient documentation

## 2023-10-06 DIAGNOSIS — W01198A Fall on same level from slipping, tripping and stumbling with subsequent striking against other object, initial encounter: Secondary | ICD-10-CM | POA: Insufficient documentation

## 2023-10-06 DIAGNOSIS — Y92009 Unspecified place in unspecified non-institutional (private) residence as the place of occurrence of the external cause: Secondary | ICD-10-CM | POA: Diagnosis not present

## 2023-10-06 DIAGNOSIS — W19XXXA Unspecified fall, initial encounter: Secondary | ICD-10-CM

## 2023-10-06 NOTE — ED Triage Notes (Signed)
 PT STATES THAT SHE FELL ON SUNDAY AND HIT HER HEAD. HAS BRUISING TO NOSE AND FACE. FAMILY AT BEDSIDE. PT IS DEAF AND INTERPRETOR IS IN USE

## 2023-10-06 NOTE — ED Notes (Signed)
 Interpretor in use in triage.

## 2023-10-06 NOTE — ED Provider Notes (Signed)
 [facility] Provider Note   CSN: [id number] Arrival date & time: 10/06/23  1043     History Chief Complaint  Patient presents with   Angela Nicholson Angela Nicholson is a 53 y.o. 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  Patient reports that she was walking through the house and tripped over a box falling and hitting her face.  Denies any loss of consciousness.  She denies any other aches or pains on her body.  Nothing tried for symptoms prior to arrival.  She denies any pain in her eye.  Reports she mainly has pain in her nose and just lateral to this below the eye.  Denies any actual eye pain or any visual symptoms.  She denies any neck pain.  Mom reports that she is been acting at her baseline.  No blood thinner use.  She denies any other chest, belly, back, or extremity pain.  She reports that her nose did not bleed at the nostrils but did bleed with the small laceration to the bridge.  She has a chronic runny nose, but this has remained at baseline. Unsure when last tetanus was.  ASL interpreter with certified deaf interpreter used during this encounter.   Fall Pertinent negatives include no abdominal pain and no headaches.       Home Medications Prior to Admission medications   Medication Sig Start Date End Date Taking? Authorizing Provider  acetaminophen (TYLENOL) 500 MG tablet Take 500 mg by mouth every 6 (six) hours as needed (cramps/pain).    [provider]  albuterol (PROVENTIL) (2.5 MG/3ML) 0.083% nebulizer solution Take 2.5 mg by nebulization every 6 (six) hours as needed for shortness of breath.    [provider]  atorvastatin (LIPITOR) 20 MG tablet Take 20 mg by mouth See admin instructions. Take one tablet (20 mg) by mouth daily at 3pm    [provider]  Calcium Carbonate-Vitamin D (CALCIUM-D PO) Take 1 Dose by mouth every morning.  Liquid - Calcium 1500 mg/ Vitamin D3 600 units    [provider]  divalproex (DEPAKOTE) 250 MG DR tablet Take 500 mg by mouth 2 (two) times daily. 8am and 8pm 03/12/21   [provider]  esomeprazole (NEXIUM) 40 MG capsule Take 1 capsule by mouth 2 (two) times daily. 05/21/22   [provider]  fluticasone (FLONASE) 50 MCG/ACT nasal spray Place 1 spray into both nostrils daily. 12/02/21   Raspet, Noberto Retort, PA-C  guaiFENesin (MUCINEX) 600 MG 12 hr tablet Take 1 tablet (600 mg total) by mouth 2 (two) times daily. 05/28/21   Alwyn Ren, MD  hydrOXYzine (VISTARIL) 25 MG capsule Take 25 mg by mouth daily as needed.    [provider]  ipratropium-albuterol (DUONEB) 0.5-2.5 (3) MG/3ML SOLN Take 3 mLs by nebulization every 4 (four) hours as needed (shortness of breath/wheezing). 07/13/22   Wallis Bamberg, PA-C  levocetirizine (XYZAL) 5 MG tablet Take 5 mg by mouth See admin instructions. Take one tablet (5 mg) by mouth every morning or take one tablet of loratadine 10 mg every morning (alternating monthly) 05/15/21   [provider]  levothyroxine (SYNTHROID) 125 MCG tablet Take 125 mcg by mouth daily before breakfast.    [provider]  mirtazapine (REMERON) 45 MG tablet Take 45 mg by mouth at bedtime. 04/03/21   [provider]  naltrexone (DEPADE) 50  MG tablet Take 100 mg by mouth 2 (two) times daily. 8am and 8pm    [provider]  OLANZapine (ZYPREXA) 20 MG tablet Take 20 mg by mouth at bedtime.    [provider]  Olopatadine HCl 0.6 % SOLN Place 2 sprays into both nostrils daily. 09/09/22   [provider]  rOPINIRole (REQUIP) 1 MG tablet Take 1 mg by mouth 2 (two) times daily. 8am and 8pm    [provider]      Allergies    Penicillins and Sulfa antibiotics    Review of Systems   Review of Systems  HENT:         Reports some nose pain  Eyes:  Negative for photophobia, pain and visual  disturbance.  Gastrointestinal:  Negative for abdominal pain.  Musculoskeletal:  Negative for arthralgias, back pain, myalgias and neck pain.  Neurological:  Negative for dizziness, light-headedness and headaches.    Physical Exam Updated Vital Signs BP 105/83   Pulse 65   Temp 97.7 F (36.5 C)   Resp 19   Ht 4\' 11"  (1.499 m)   Wt 51 kg   LMP 06/17/2013   SpO2 96%   BMI 22.72 kg/m  Physical Exam Vitals and nursing note reviewed.  Constitutional:      General: She is not in acute distress.    Appearance: She is not ill-appearing or toxic-appearing.  HENT:     Head:     Comments: Abrasion over bridge of nose.  Some swelling and bruising noted to the lower medial skin of the left eye.  No other step-offs or deformities.  Scalp nontender to palpation.  No battle signs or classic raccoon eyes.  There is no bruising seen to the right eye.    Right Ear: Tympanic membrane, ear canal and external ear normal.     Left Ear: Tympanic membrane, ear canal and external ear normal.     Nose:     Comments: Small abrasions seen to the upper bridge of the nose.  Scabbed.  No surrounding erythema.  No increase in warmth.  No discharge.  No septal hematoma seen.    Mouth/Throat:     Mouth: Mucous membranes are moist.  Eyes:     General: No scleral icterus.    Extraocular Movements: Extraocular movements intact.     Pupils: Pupils are equal, round, and reactive to light.     Comments: Some minor bruising noted to the inferior aspect of the medial left thigh.  No step-offs or deformities.  Mild tenderness upon palpation.  Denies any pain with movement of the eyes.  PERRLA.  EOMI.  Neck:     Comments: No midline or paraspinal tenderness palpation.  No step-offs or deformities.  No overlying skin features or signs of trauma. Cardiovascular:     Rate and Rhythm: Normal rate.  Pulmonary:     Effort: Pulmonary effort is normal. No respiratory distress.  Abdominal:     Palpations: Abdomen is soft.      Tenderness: There is no abdominal tenderness.  Musculoskeletal:        General: No tenderness.     Cervical back: Normal range of motion. No tenderness.     Comments: No tenderness to the bilateral upper and lower extremities.  Strength is intact and at baseline.  No midline or paraspinal cervical, thoracic, or lumbar tenderness palpation.  No tenderness to the snuffboxes.  Skin:    General: Skin is warm and dry.  Neurological:  Mental Status: She is alert. Mental status is at baseline.     Comments: Patient's deaf otherwise cranial nerves are intact.  Mom reports at baseline.  She has abnormal gait however this is chronic.  Mom reports her gait is at baseline.     ED Results / Procedures / Treatments   Labs (all labs ordered are listed, but only abnormal results are displayed) Labs Reviewed - No data to display  EKG None  Radiology CT Head Wo Contrast Result Date: 10/06/2023 CLINICAL DATA:  Fall on Sunday, head injury EXAM: CT HEAD WITHOUT CONTRAST CT MAXILLOFACIAL WITHOUT CONTRAST CT CERVICAL SPINE WITHOUT CONTRAST TECHNIQUE: Multidetector CT imaging of the head, cervical spine, and maxillofacial structures were performed using the standard protocol without intravenous contrast. Multiplanar CT image reconstructions of the cervical spine and maxillofacial structures were also generated. RADIATION DOSE REDUCTION: This exam was performed according to the departmental dose-optimization program which includes automated exposure control, adjustment of the mA and/or kV according to patient size and/or use of iterative reconstruction technique. COMPARISON:  None Available. FINDINGS: CT HEAD FINDINGS Brain: No evidence of acute infarction, hemorrhage, hydrocephalus, extra-axial collection or mass lesion/mass effect. Periventricular white matter hypodensity. Vascular: No hyperdense vessel or unexpected calcification. CT FACIAL BONES FINDINGS Skull: Normal. Negative for fracture or focal lesion.  Facial bones: No displaced fractures or dislocations. Sinuses/Orbits: No acute finding. Other: None. CT CERVICAL SPINE FINDINGS Evaluation for fracture significantly limited by patient motion artifact. Alignment: Degenerative straightening of cervical lordosis. Skull base and vertebrae: No acute fracture. No primary bone lesion or focal pathologic process. Soft tissues and spinal canal: No prevertebral fluid or swelling. No visible canal hematoma. Disc levels: Moderate multilevel cervical disc degenerative and osteophytosis from C4-C7, including a posterior bridging osteophyte or OPLL the right aspect of C4-C5 (series 605, image 29). Upper chest: Negative. Other: None. IMPRESSION: 1. No acute intracranial pathology. Small-vessel white matter disease. 2. No displaced fractures or dislocations of the facial bones. 3. Evaluation for cervical spine fracture significantly limited by patient motion artifact. Within this limitation, no fracture or static subluxation of the cervical spine. 4. Moderate multilevel cervical disc degenerative and osteophytosis from C4-C7, including a posterior bridging osteophyte or OPLL the right aspect of C4-C5. Electronically Signed   By: Jearld Lesch M.D.   On: 10/06/2023 13:08   CT Maxillofacial Wo Contrast Result Date: 10/06/2023 CLINICAL DATA:  Fall on Sunday, head injury EXAM: CT HEAD WITHOUT CONTRAST CT MAXILLOFACIAL WITHOUT CONTRAST CT CERVICAL SPINE WITHOUT CONTRAST TECHNIQUE: Multidetector CT imaging of the head, cervical spine, and maxillofacial structures were performed using the standard protocol without intravenous contrast. Multiplanar CT image reconstructions of the cervical spine and maxillofacial structures were also generated. RADIATION DOSE REDUCTION: This exam was performed according to the departmental dose-optimization program which includes automated exposure control, adjustment of the mA and/or kV according to patient size and/or use of iterative reconstruction  technique. COMPARISON:  None Available. FINDINGS: CT HEAD FINDINGS Brain: No evidence of acute infarction, hemorrhage, hydrocephalus, extra-axial collection or mass lesion/mass effect. Periventricular white matter hypodensity. Vascular: No hyperdense vessel or unexpected calcification. CT FACIAL BONES FINDINGS Skull: Normal. Negative for fracture or focal lesion. Facial bones: No displaced fractures or dislocations. Sinuses/Orbits: No acute finding. Other: None. CT CERVICAL SPINE FINDINGS Evaluation for fracture significantly limited by patient motion artifact. Alignment: Degenerative straightening of cervical lordosis. Skull base and vertebrae: No acute fracture. No primary bone lesion or focal pathologic process. Soft tissues and spinal canal: No prevertebral fluid or swelling.  No visible canal hematoma. Disc levels: Moderate multilevel cervical disc degenerative and osteophytosis from C4-C7, including a posterior bridging osteophyte or OPLL the right aspect of C4-C5 (series 605, image 29). Upper chest: Negative. Other: None. IMPRESSION: 1. No acute intracranial pathology. Small-vessel white matter disease. 2. No displaced fractures or dislocations of the facial bones. 3. Evaluation for cervical spine fracture significantly limited by patient motion artifact. Within this limitation, no fracture or static subluxation of the cervical spine. 4. Moderate multilevel cervical disc degenerative and osteophytosis from C4-C7, including a posterior bridging osteophyte or OPLL the right aspect of C4-C5. Electronically Signed   By: Jearld Lesch M.D.   On: 10/06/2023 13:08   CT Cervical Spine Wo Contrast Result Date: 10/06/2023 CLINICAL DATA:  Fall on Sunday, head injury EXAM: CT HEAD WITHOUT CONTRAST CT MAXILLOFACIAL WITHOUT CONTRAST CT CERVICAL SPINE WITHOUT CONTRAST TECHNIQUE: Multidetector CT imaging of the head, cervical spine, and maxillofacial structures were performed using the standard protocol without intravenous  contrast. Multiplanar CT image reconstructions of the cervical spine and maxillofacial structures were also generated. RADIATION DOSE REDUCTION: This exam was performed according to the departmental dose-optimization program which includes automated exposure control, adjustment of the mA and/or kV according to patient size and/or use of iterative reconstruction technique. COMPARISON:  None Available. FINDINGS: CT HEAD FINDINGS Brain: No evidence of acute infarction, hemorrhage, hydrocephalus, extra-axial collection or mass lesion/mass effect. Periventricular white matter hypodensity. Vascular: No hyperdense vessel or unexpected calcification. CT FACIAL BONES FINDINGS Skull: Normal. Negative for fracture or focal lesion. Facial bones: No displaced fractures or dislocations. Sinuses/Orbits: No acute finding. Other: None. CT CERVICAL SPINE FINDINGS Evaluation for fracture significantly limited by patient motion artifact. Alignment: Degenerative straightening of cervical lordosis. Skull base and vertebrae: No acute fracture. No primary bone lesion or focal pathologic process. Soft tissues and spinal canal: No prevertebral fluid or swelling. No visible canal hematoma. Disc levels: Moderate multilevel cervical disc degenerative and osteophytosis from C4-C7, including a posterior bridging osteophyte or OPLL the right aspect of C4-C5 (series 605, image 29). Upper chest: Negative. Other: None. IMPRESSION: 1. No acute intracranial pathology. Small-vessel white matter disease. 2. No displaced fractures or dislocations of the facial bones. 3. Evaluation for cervical spine fracture significantly limited by patient motion artifact. Within this limitation, no fracture or static subluxation of the cervical spine. 4. Moderate multilevel cervical disc degenerative and osteophytosis from C4-C7, including a posterior bridging osteophyte or OPLL the right aspect of C4-C5. Electronically Signed   By: Jearld Lesch M.D.   On: 10/06/2023  13:08    Procedures Procedures   Medications Ordered in ED Medications - No data to display  ED Course/ Medical Decision Making/ A&P   Medical Decision Making Amount and/or Complexity of Data Reviewed Radiology: ordered.   53 y.o. female presents to the ER for evaluation of nasal pain after mechanical fall. Differential diagnosis includes but is not limited to trauma, contusion. Vital signs unremarkable. Physical exam as noted above.   CT imaging of the head, face, and neck shows  1. No acute intracranial pathology. Small-vessel white matter disease. 2. No displaced fractures or dislocations of the facial bones. 3. Evaluation for cervical spine fracture significantly limited by patient motion artifact. Within this limitation, no fracture or static subluxation of the cervical spine. 4. Moderate multilevel cervical disc degenerative and osteophytosis from C4-C7, including a posterior bridging osteophyte or OPLL the right aspect of C4-C5.  Per radiologist's interpretation.    Patient's tetanus is up-to-date.  Does not  need revaccination of this.  Her imaging shows some arthritis but no acute trauma.  She does not have any neck tenderness to palpation.  She is acting her baseline per mom.  This is a mechanical fall.  I do not think any additional labs are needed.  She is does not have any tenderness to palpation over her body or extremities.  I do not think any other imaging is needed at this time.  I do not appreciate any new focal weakness.  Again, mom reports that she is at her baseline.  Patient also feels that she is at her baseline as well.  She likely has a facial contusion.  She does not have any pain with movement of her eye or any pain in the eye itself.  I do not think fluorescein staining is needed at this time.  She has no pain with movement of the eye or to the eyeball itself, I doubt any trauma recommended ice and taking Tylenol or ibuprofen as needed.  Given the reported baseline  status is and reassuring imaging, patient stable for discharge home with outpatient follow-up.  We discussed the results of the labs/imaging. The plan is cleaning abrasion daily, follow-up with PCP. We discussed strict return precautions and red flag symptoms. The patient verbalized their understanding and agrees to the plan. The patient is stable and being discharged home in good condition.  Portions of this report may have been transcribed using voice recognition software. Every effort was made to ensure accuracy; however, inadvertent computerized transcription errors may be present.   ASL interpreter and certified deaf interpreter used during this encounter.  Final Clinical Impression(s) / ED Diagnoses Final diagnoses:  Fall, initial encounter  Contusion of face, initial encounter    Rx / DC Orders ED Discharge Orders     None         Achille Rich, PA-C 10/10/23 1931    Virgina Norfolk, DO 10/11/23 [phone]

## 2023-10-06 NOTE — ED Notes (Addendum)
 Interpreter Cala Bradford [phone] used to review d/c paperwork-  D/c paperwork reviewed with pt and pts mother at bedside, including follow up care.  All questions and/or concerns addressed at time of d/c.  No further needs expressed. . Pt verbalized understanding, Ambulatory with family to ED exit, NAD.

## 2023-10-06 NOTE — Discharge Instructions (Signed)
 You were seen in the emergency department today for evaluation after your fall.  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  I would like for you to follow-up with [facility] regarding this.  They do not see any broken bones of your face.  Please make sure you are cleaning your wound daily with Dial soap and water.  You can apply something like Aquaphor or Vaseline over the area as well.  You can ice your face for 15 minutes maximum every few hours as needed to help with the pain and inflammation.  For pain, you can take 1000 mg of Tylenol and/or 600 mg of ibuprofen every 6 hours as needed for pain.  Please follow-up with your primary care provider within the next few days for reevaluation if needed.  If you have any concerns, new or worsening symptoms, please return to your nearest emergency department for reevaluation.  Contact a doctor if: You have trouble biting or chewing. Your pain or swelling gets worse. The bruised area gets worse. Get help right away if: You have very bad pain or a headache, and medicine does not help. You are very tired or confused. Your personality changes. You vomit. You have a nosebleed that does not stop. You see two of everything (double vision) or have blurry vision. You have clear fluid coming from your nose or ear, and it does not go away. You have problems walking or using your arms or legs. You feel very dizzy.

## 2024-03-25 ENCOUNTER — Other Ambulatory Visit (HOSPITAL_BASED_OUTPATIENT_CLINIC_OR_DEPARTMENT_OTHER): Payer: Self-pay

## 2024-03-25 MED ORDER — FLUZONE 0.5 ML IM SUSY
0.5000 mL | PREFILLED_SYRINGE | Freq: Once | INTRAMUSCULAR | 0 refills | Status: AC
  Filled 2024-03-25: qty 0.5, 1d supply, fill #0
